# Patient Record
Sex: Female | Born: 1935 | Race: White | Hispanic: No | Smoking: Never smoker
Health system: Southern US, Community
[De-identification: ages and names within clinical notes are randomized; demographics above are authoritative.]

## PROBLEM LIST (undated history)

## (undated) DIAGNOSIS — E079 Disorder of thyroid, unspecified: Secondary | ICD-10-CM

## (undated) DIAGNOSIS — I10 Essential (primary) hypertension: Secondary | ICD-10-CM

## (undated) DIAGNOSIS — E785 Hyperlipidemia, unspecified: Secondary | ICD-10-CM

## (undated) DIAGNOSIS — E119 Type 2 diabetes mellitus without complications: Secondary | ICD-10-CM

## (undated) DIAGNOSIS — I82409 Acute embolism and thrombosis of unspecified deep veins of unspecified lower extremity: Secondary | ICD-10-CM

## (undated) HISTORY — PX: WRIST SURGERY: SHX841

## (undated) HISTORY — DX: Acute embolism and thrombosis of unspecified deep veins of unspecified lower extremity: I82.409

## (undated) HISTORY — PX: ANKLE SURGERY: SHX546

## (undated) HISTORY — DX: Disorder of thyroid, unspecified: E07.9

## (undated) HISTORY — DX: Essential (primary) hypertension: I10

## (undated) HISTORY — PX: HIP FRACTURE SURGERY: SHX118

## (undated) HISTORY — DX: Hyperlipidemia, unspecified: E78.5

## (undated) HISTORY — DX: Type 2 diabetes mellitus without complications: E11.9

---

## 2004-10-27 ENCOUNTER — Emergency Department: Payer: Self-pay | Admitting: Emergency Medicine

## 2004-12-12 ENCOUNTER — Encounter: Payer: Self-pay | Admitting: Orthopaedic Surgery

## 2004-12-18 ENCOUNTER — Encounter: Payer: Self-pay | Admitting: Orthopaedic Surgery

## 2005-01-18 ENCOUNTER — Encounter: Payer: Self-pay | Admitting: Orthopaedic Surgery

## 2005-02-12 ENCOUNTER — Ambulatory Visit: Payer: Self-pay | Admitting: Internal Medicine

## 2006-04-11 ENCOUNTER — Ambulatory Visit: Payer: Self-pay | Admitting: Internal Medicine

## 2007-04-17 ENCOUNTER — Ambulatory Visit: Payer: Self-pay | Admitting: Nurse Practitioner

## 2007-04-24 ENCOUNTER — Ambulatory Visit: Payer: Self-pay | Admitting: Internal Medicine

## 2007-05-07 ENCOUNTER — Ambulatory Visit: Payer: Self-pay | Admitting: Internal Medicine

## 2007-07-02 ENCOUNTER — Inpatient Hospital Stay: Payer: Self-pay | Admitting: Vascular Surgery

## 2008-01-05 ENCOUNTER — Ambulatory Visit: Payer: Self-pay | Admitting: Urology

## 2008-01-16 ENCOUNTER — Ambulatory Visit: Payer: Self-pay | Admitting: Urology

## 2008-01-30 ENCOUNTER — Ambulatory Visit: Payer: Self-pay | Admitting: Urology

## 2008-05-27 ENCOUNTER — Ambulatory Visit: Payer: Self-pay | Admitting: Family Medicine

## 2009-06-16 ENCOUNTER — Ambulatory Visit: Payer: Self-pay | Admitting: Family Medicine

## 2010-02-17 ENCOUNTER — Ambulatory Visit: Payer: Self-pay | Admitting: Internal Medicine

## 2010-02-22 ENCOUNTER — Ambulatory Visit: Payer: Self-pay | Admitting: Unknown Physician Specialty

## 2010-06-12 ENCOUNTER — Ambulatory Visit: Payer: Self-pay | Admitting: Family Medicine

## 2010-06-19 ENCOUNTER — Ambulatory Visit: Payer: Self-pay | Admitting: Family Medicine

## 2011-10-08 ENCOUNTER — Inpatient Hospital Stay: Payer: Self-pay | Admitting: Unknown Physician Specialty

## 2011-10-08 LAB — URINALYSIS, COMPLETE
Bacteria: NONE SEEN
Bilirubin,UR: NEGATIVE
Glucose,UR: NEGATIVE mg/dL (ref 0–75)
Protein: NEGATIVE
RBC,UR: 1 /HPF (ref 0–5)
Squamous Epithelial: 1
WBC UR: 10 /HPF (ref 0–5)

## 2011-10-08 LAB — CBC WITH DIFFERENTIAL/PLATELET
Basophil #: 0 10*3/uL (ref 0.0–0.1)
Basophil %: 0.4 %
Eosinophil #: 0.3 10*3/uL (ref 0.0–0.7)
HCT: 38.2 % (ref 35.0–47.0)
Lymphocyte %: 22.3 %
MCHC: 32.7 g/dL (ref 32.0–36.0)
MCV: 95 fL (ref 80–100)
Monocyte #: 0.6 10*3/uL (ref 0.0–0.7)
Neutrophil %: 67.3 %
RBC: 4.01 10*6/uL (ref 3.80–5.20)
RDW: 13.7 % (ref 11.5–14.5)

## 2011-10-08 LAB — BASIC METABOLIC PANEL
BUN: 19 mg/dL — ABNORMAL HIGH (ref 7–18)
Calcium, Total: 9.4 mg/dL (ref 8.5–10.1)
Co2: 26 mmol/L (ref 21–32)
EGFR (African American): >60
EGFR (Non-African Amer.): >60
Glucose: 144 mg/dL — ABNORMAL HIGH (ref 65–99)
Osmolality: 286 (ref 275–301)
Potassium: 4.1 mmol/L (ref 3.5–5.1)
Sodium: 141 mmol/L (ref 136–145)

## 2011-10-08 LAB — APTT: Activated PTT: 26.9 secs (ref 23.6–35.9)

## 2011-10-09 LAB — BASIC METABOLIC PANEL
Anion Gap: 8 (ref 7–16)
BUN: 19 mg/dL — ABNORMAL HIGH (ref 7–18)
Calcium, Total: 8.2 mg/dL — ABNORMAL LOW (ref 8.5–10.1)
Chloride: 111 mmol/L — ABNORMAL HIGH (ref 98–107)
Co2: 23 mmol/L (ref 21–32)
Creatinine: 0.87 mg/dL (ref 0.60–1.30)
EGFR (African American): >60
EGFR (Non-African Amer.): >60
Glucose: 153 mg/dL — ABNORMAL HIGH (ref 65–99)
Osmolality: 288 (ref 275–301)
Sodium: 142 mmol/L (ref 136–145)

## 2011-10-09 LAB — HEMOGLOBIN: HGB: 9.4 g/dL — ABNORMAL LOW (ref 12.0–16.0)

## 2011-10-11 LAB — HEMOGLOBIN: HGB: 8.4 g/dL — ABNORMAL LOW (ref 12.0–16.0)

## 2012-02-20 ENCOUNTER — Ambulatory Visit: Payer: Self-pay | Admitting: Family Medicine

## 2012-12-08 ENCOUNTER — Ambulatory Visit: Payer: Self-pay | Admitting: Family Medicine

## 2013-03-20 ENCOUNTER — Ambulatory Visit: Payer: Self-pay | Admitting: Family Medicine

## 2013-07-14 ENCOUNTER — Encounter: Payer: Self-pay | Admitting: Podiatry

## 2013-07-14 ENCOUNTER — Ambulatory Visit (INDEPENDENT_AMBULATORY_CARE_PROVIDER_SITE_OTHER): Payer: Medicare Other | Admitting: Podiatry

## 2013-07-14 VITALS — BP 134/79 | HR 76 | Resp 18 | Ht 59.0 in | Wt 118.0 lb

## 2013-07-14 DIAGNOSIS — M79609 Pain in unspecified limb: Secondary | ICD-10-CM

## 2013-07-14 DIAGNOSIS — M2040 Other hammer toe(s) (acquired), unspecified foot: Secondary | ICD-10-CM

## 2013-07-14 DIAGNOSIS — B351 Tinea unguium: Secondary | ICD-10-CM

## 2013-07-14 DIAGNOSIS — Q828 Other specified congenital malformations of skin: Secondary | ICD-10-CM

## 2013-07-14 DIAGNOSIS — E1159 Type 2 diabetes mellitus with other circulatory complications: Secondary | ICD-10-CM

## 2013-07-14 NOTE — Progress Notes (Signed)
  Subjective:    Patient ID: Lorraine Fischer, female    DOB: [birth year], 79 y.o.   MRN: [id number]  HPI Comments: N toenails and dry skin, painful  L both feet 5 D diabetic 1 year  O C A T cant cut self, made a mess of the toenails cant see them      Review of Systems  Endocrine:       Diabetes   All other systems reviewed and are negative.       Objective:   Physical Exam        Assessment & Plan:

## 2013-07-15 NOTE — Progress Notes (Signed)
Subjective:     Patient ID: Lorraine Fischer, female   DOB: Jan 09, 1923, 79 y.o.   MRN: [id number]  HPI patient presents stating I have a painful corn on my big toe right over left foot and nail disease of both feet. States that she is a diabetic has circulation disease cannot feel her feet and is having pain in shoe gear or when the sheets touch her foot right   Review of Systems  All other systems reviewed and are negative.       Objective:   Physical Exam  Nursing note and vitals reviewed. Constitutional: She is oriented to person, place, and time.  Neurological: She is oriented to person, place, and time.  Skin: Skin is dry.   neurovascular status is diminished on both feet with diminishment of sharp dull vibratory and nail disease with thickness and discomfort when pressed 1-5 of both feet. I noted distal right there is a keratotic lesion that is painful when pressed due to the elongation of the big toe     Assessment:     At wrist diabetic with lesion distal right consistent with porokeratosis and nail disease that is painful 1-5 both feet    Plan:     Education given to patient concerning diabetes and foot care. Debridement nailbeds 1-5 both feet with no iatrogenic bleeding and distal right hallux debridement lesion which was very thin and a small amount of bleeding occurred with Band-Aids and Neosporin applied recommended diabetic shoes and we will get permission for her to have these made as they do consider her at risk for future ulceration

## 2013-10-04 ENCOUNTER — Observation Stay: Payer: Self-pay | Admitting: Internal Medicine

## 2013-10-04 DIAGNOSIS — I369 Nonrheumatic tricuspid valve disorder, unspecified: Secondary | ICD-10-CM

## 2013-10-04 LAB — COMPREHENSIVE METABOLIC PANEL
ANION GAP: 3 — AB (ref 7–16)
Albumin: 3.9 g/dL (ref 3.4–5.0)
Alkaline Phosphatase: 73 U/L
BILIRUBIN TOTAL: 0.4 mg/dL (ref 0.2–1.0)
BUN: 18 mg/dL (ref 7–18)
CHLORIDE: 107 mmol/L (ref 98–107)
CO2: 28 mmol/L (ref 21–32)
Calcium, Total: 9.7 mg/dL (ref 8.5–10.1)
Creatinine: 0.76 mg/dL (ref 0.60–1.30)
Glucose: 102 mg/dL — ABNORMAL HIGH (ref 65–99)
Osmolality: 278 (ref 275–301)
Potassium: 3.8 mmol/L (ref 3.5–5.1)
SGOT(AST): 20 U/L (ref 15–37)
SGPT (ALT): 18 U/L (ref 12–78)
Sodium: 138 mmol/L (ref 136–145)
Total Protein: 7.1 g/dL (ref 6.4–8.2)

## 2013-10-04 LAB — URINALYSIS, COMPLETE
BACTERIA: NONE SEEN
BILIRUBIN, UR: NEGATIVE
Blood: NEGATIVE
Glucose,UR: NEGATIVE mg/dL (ref 0–75)
Ketone: NEGATIVE
Leukocyte Esterase: NEGATIVE
Nitrite: NEGATIVE
PROTEIN: NEGATIVE
Ph: 6 (ref 4.5–8.0)
RBC,UR: 1 /HPF (ref 0–5)
SPECIFIC GRAVITY: 1.014 (ref 1.003–1.030)
Squamous Epithelial: <1
Transitional Epi: 1
WBC UR: 6 /HPF (ref 0–5)

## 2013-10-04 LAB — CBC
HCT: 41.2 % (ref 35.0–47.0)
HGB: 13.5 g/dL (ref 12.0–16.0)
MCH: 31.2 pg (ref 26.0–34.0)
MCHC: 32.7 g/dL (ref 32.0–36.0)
MCV: 96 fL (ref 80–100)
PLATELETS: 210 10*3/uL (ref 150–440)
RBC: 4.31 10*6/uL (ref 3.80–5.20)
RDW: 13.9 % (ref 11.5–14.5)
WBC: 8.2 10*3/uL (ref 3.6–11.0)

## 2013-10-04 LAB — TROPONIN I
Troponin-I: <0.02 ng/mL
Troponin-I: <0.02 ng/mL

## 2013-10-04 LAB — CK TOTAL AND CKMB (NOT AT ARMC)
CK, TOTAL: 61 U/L
CK, Total: 52 U/L
CK, Total: 61 U/L
CK-MB: 1.4 ng/mL (ref 0.5–3.6)
CK-MB: 1.4 ng/mL (ref 0.5–3.6)
CK-MB: 1.4 ng/mL (ref 0.5–3.6)

## 2013-10-05 LAB — CBC WITH DIFFERENTIAL/PLATELET
BASOS PCT: 0.5 %
Basophil #: 0 10*3/uL (ref 0.0–0.1)
Eosinophil #: 0.6 10*3/uL (ref 0.0–0.7)
Eosinophil %: 8 %
HCT: 37.5 % (ref 35.0–47.0)
HGB: 12.7 g/dL (ref 12.0–16.0)
Lymphocyte #: 2.4 10*3/uL (ref 1.0–3.6)
Lymphocyte %: 33 %
MCH: 32.1 pg (ref 26.0–34.0)
MCHC: 33.7 g/dL (ref 32.0–36.0)
MCV: 95 fL (ref 80–100)
MONOS PCT: 8.5 %
Monocyte #: 0.6 x10 3/mm (ref 0.2–0.9)
Neutrophil #: 3.6 10*3/uL (ref 1.4–6.5)
Neutrophil %: 50 %
Platelet: 198 10*3/uL (ref 150–440)
RBC: 3.94 10*6/uL (ref 3.80–5.20)
RDW: 13.6 % (ref 11.5–14.5)
WBC: 7.2 10*3/uL (ref 3.6–11.0)

## 2013-10-05 LAB — BASIC METABOLIC PANEL
Anion Gap: 7 (ref 7–16)
BUN: 20 mg/dL — AB (ref 7–18)
CALCIUM: 9.4 mg/dL (ref 8.5–10.1)
CO2: 25 mmol/L (ref 21–32)
CREATININE: 0.75 mg/dL (ref 0.60–1.30)
Chloride: 109 mmol/L — ABNORMAL HIGH (ref 98–107)
EGFR (African American): >60
EGFR (Non-African Amer.): >60
Glucose: 112 mg/dL — ABNORMAL HIGH (ref 65–99)
OSMOLALITY: 285 (ref 275–301)
Potassium: 3.7 mmol/L (ref 3.5–5.1)
SODIUM: 141 mmol/L (ref 136–145)

## 2013-10-05 LAB — LIPID PANEL
CHOLESTEROL: 146 mg/dL (ref 0–200)
HDL Cholesterol: 45 mg/dL (ref 40–60)
LDL CHOLESTEROL, CALC: 75 mg/dL (ref 0–100)
TRIGLYCERIDES: 131 mg/dL (ref 0–200)
VLDL CHOLESTEROL, CALC: 26 mg/dL (ref 5–40)

## 2013-10-05 LAB — TSH: Thyroid Stimulating Horm: 2.2 u[IU]/mL

## 2013-10-05 LAB — MAGNESIUM: Magnesium: 1.7 mg/dL — ABNORMAL LOW

## 2013-10-05 LAB — HEMOGLOBIN A1C: Hemoglobin A1C: 6.1 % (ref 4.2–6.3)

## 2013-10-13 ENCOUNTER — Ambulatory Visit: Payer: Medicare Other | Admitting: Podiatry

## 2013-12-01 ENCOUNTER — Ambulatory Visit: Payer: Self-pay | Admitting: Vascular Surgery

## 2013-12-01 LAB — CBC
HCT: 37.7 % (ref 35.0–47.0)
HGB: 12.6 g/dL (ref 12.0–16.0)
MCH: 31.8 pg (ref 26.0–34.0)
MCHC: 33.3 g/dL (ref 32.0–36.0)
MCV: 96 fL (ref 80–100)
PLATELETS: 202 10*3/uL (ref 150–440)
RBC: 3.95 10*6/uL (ref 3.80–5.20)
RDW: 13.9 % (ref 11.5–14.5)
WBC: 8.2 10*3/uL (ref 3.6–11.0)

## 2013-12-01 LAB — URINALYSIS, COMPLETE
BACTERIA: NONE SEEN
BLOOD: NEGATIVE
Bilirubin,UR: NEGATIVE
Glucose,UR: NEGATIVE mg/dL (ref 0–75)
Hyaline Cast: 2
Ketone: NEGATIVE
NITRITE: NEGATIVE
PH: 5 (ref 4.5–8.0)
Protein: NEGATIVE
RBC,UR: 1 /HPF (ref 0–5)
Specific Gravity: 1.019 (ref 1.003–1.030)
Squamous Epithelial: 3

## 2013-12-01 LAB — BASIC METABOLIC PANEL
Anion Gap: 4 — ABNORMAL LOW (ref 7–16)
BUN: 23 mg/dL — ABNORMAL HIGH (ref 7–18)
CALCIUM: 9.9 mg/dL (ref 8.5–10.1)
Chloride: 107 mmol/L (ref 98–107)
Co2: 28 mmol/L (ref 21–32)
Creatinine: 0.74 mg/dL (ref 0.60–1.30)
EGFR (African American): >60
Glucose: 106 mg/dL — ABNORMAL HIGH (ref 65–99)
OSMOLALITY: 282 (ref 275–301)
Potassium: 4.7 mmol/L (ref 3.5–5.1)
Sodium: 139 mmol/L (ref 136–145)

## 2013-12-03 ENCOUNTER — Inpatient Hospital Stay: Payer: Self-pay | Admitting: Vascular Surgery

## 2013-12-04 LAB — PROTIME-INR
INR: 1
Prothrombin Time: 13 secs (ref 11.5–14.7)

## 2013-12-04 LAB — CBC WITH DIFFERENTIAL/PLATELET
Basophil #: 0 10*3/uL (ref 0.0–0.1)
Basophil %: 0.5 %
Eosinophil #: 0.3 10*3/uL (ref 0.0–0.7)
Eosinophil %: 3.1 %
HCT: 31.8 % — ABNORMAL LOW (ref 35.0–47.0)
HGB: 10.5 g/dL — ABNORMAL LOW (ref 12.0–16.0)
Lymphocyte #: 1.5 10*3/uL (ref 1.0–3.6)
Lymphocyte %: 17.8 %
MCH: 31.8 pg (ref 26.0–34.0)
MCHC: 33 g/dL (ref 32.0–36.0)
MCV: 96 fL (ref 80–100)
MONOS PCT: 7.4 %
Monocyte #: 0.6 x10 3/mm (ref 0.2–0.9)
NEUTROS ABS: 5.8 10*3/uL (ref 1.4–6.5)
NEUTROS PCT: 71.2 %
Platelet: 181 10*3/uL (ref 150–440)
RBC: 3.31 10*6/uL — AB (ref 3.80–5.20)
RDW: 13.9 % (ref 11.5–14.5)
WBC: 8.2 10*3/uL (ref 3.6–11.0)

## 2013-12-04 LAB — BASIC METABOLIC PANEL
Anion Gap: 4 — ABNORMAL LOW (ref 7–16)
BUN: 14 mg/dL (ref 7–18)
CHLORIDE: 113 mmol/L — AB (ref 98–107)
Calcium, Total: 8.1 mg/dL — ABNORMAL LOW (ref 8.5–10.1)
Co2: 24 mmol/L (ref 21–32)
Creatinine: 0.59 mg/dL — ABNORMAL LOW (ref 0.60–1.30)
EGFR (African American): >60
EGFR (Non-African Amer.): >60
GLUCOSE: 109 mg/dL — AB (ref 65–99)
Osmolality: 282 (ref 275–301)
Potassium: 3.8 mmol/L (ref 3.5–5.1)
SODIUM: 141 mmol/L (ref 136–145)

## 2013-12-04 LAB — APTT: ACTIVATED PTT: 27.1 s (ref 23.6–35.9)

## 2013-12-07 LAB — PATHOLOGY REPORT

## 2014-03-08 ENCOUNTER — Ambulatory Visit: Payer: Self-pay | Admitting: Family Medicine

## 2014-09-15 DIAGNOSIS — E119 Type 2 diabetes mellitus without complications: Secondary | ICD-10-CM | POA: Diagnosis not present

## 2014-09-23 DIAGNOSIS — Z0000 Encounter for general adult medical examination without abnormal findings: Secondary | ICD-10-CM | POA: Diagnosis not present

## 2014-09-23 DIAGNOSIS — E039 Hypothyroidism, unspecified: Secondary | ICD-10-CM | POA: Diagnosis not present

## 2014-09-23 DIAGNOSIS — E784 Other hyperlipidemia: Secondary | ICD-10-CM | POA: Diagnosis not present

## 2014-09-23 DIAGNOSIS — I10 Essential (primary) hypertension: Secondary | ICD-10-CM | POA: Diagnosis not present

## 2014-09-23 DIAGNOSIS — Z9181 History of falling: Secondary | ICD-10-CM | POA: Diagnosis not present

## 2014-09-23 DIAGNOSIS — K219 Gastro-esophageal reflux disease without esophagitis: Secondary | ICD-10-CM | POA: Diagnosis not present

## 2014-09-23 DIAGNOSIS — Z1389 Encounter for screening for other disorder: Secondary | ICD-10-CM | POA: Diagnosis not present

## 2014-10-22 DIAGNOSIS — H6123 Impacted cerumen, bilateral: Secondary | ICD-10-CM | POA: Diagnosis not present

## 2014-10-22 DIAGNOSIS — H903 Sensorineural hearing loss, bilateral: Secondary | ICD-10-CM | POA: Diagnosis not present

## 2014-12-11 NOTE — Discharge Summary (Signed)
PATIENT NAME:  Lorraine Fischer, Lorraine Fischer MR#:  [id number] DATE OF BIRTH:  07/31/1923  DATE OF ADMISSION:  10/04/2013 DATE OF DISCHARGE:  10/05/2013  ADMITTING PHYSICIAN: Dr. Bridgette Habermann   DISCHARGING PHYSICIAN: Gladstone Lighter, MD  PRIMARY CARE PHYSICIAN: Dr. Otilio Miu.   CONSULTATIONS IN THE HOSPITAL: Vascular by Dr. Leotis Pain.   DISCHARGE DIAGNOSES: 1.  Syncope likely secondary to vasovagal vs significant right ICA stenosis. 2.  Hemodynamically significant right internal carotid artery stenosis confirmed by CT angiography.  3.  Hypothyroidism.  4.  Hypertension.  5.  Non-insulin-dependent diabetes mellitus.  6.  Osteoarthritis. 7.  Chronic low back pain and degenerative joint disease.  8.  Prior left carotid endarterectomy.  DISCHARGE HOME MEDICATIONS: 1.  Cardizem 180 mg p.o. daily.  2.  Metformin 500 mg p.o. daily.  3.  Zolpidem 10 mg p.o. at bedtime as needed for sleep. 4.  Levothyroxine 50 mcg p.o. daily.  5.  Lovastatin 40 mg p.o. daily.  6.  Lisinopril 40 mg p.o. daily. 7.  Aspirin 81 mg p.o. daily.  8.  Multivitamin 1 tablet p.o. daily.  9.  Aleve 220 mg p.o. q. 8 hours p.r.n.  10.  Plavix 75 mg p.o. daily.   DISCHARGE DIET: Low-sodium and ADA 1800 calorie diet.   DISCHARGE ACTIVITY: As tolerated.   FOLLOWUP INSTRUCTIONS: 1.  PCP followup in 3 weeks.  2.  Vascular followup in 1 to 2 weeks.   HOME OXYGEN: None.  LABORATORY AND IMAGING STUDIES PRIOR TO DISCHARGE: WBC 7.2, hemoglobin 12.7, hematocrit 37.5, platelet count 198.   Sodium 141, potassium 3.7, chloride 109, bicarb 25, BUN 20, creatinine 0.75, glucose 112, and calcium 9.4. Magnesium 1.7. HbA1c 6.1.   LDL cholesterol 75, HDL 45, triglycerides 131, total cholesterol 146. TSH was 2.2. Troponins were negative while in the hospital. Urinalysis negative for any infection.   Echo Doppler showing no evidence of TIA or CVA. Left ventricular ejection fraction is 60% to 65%.   Chest x-rays revealing clear lung fields. No  active cardiopulmonary disease.   CT of the head without contrast showing no acute intracranial abnormality and involutional changes are noted.   Ultrasound carotid Doppler showing right carotid bifurcation and proximal ICA clot resulting in high-grade stenosis, 70 to 99%. No suggestion of hemodynamically significant residual or recurrent stenosis. Post left carotid endarterectomy noted.   CT angiogram of the neck revealing high-grade atherosclerotic narrowing of the proximal ICA, approximately 75% stenosis, and left carotid endarterectomy with no recurrent stenosis and fibromuscular dysplasia on the left more than right internal carotid artery is noted.   BRIEF HOSPITAL COURSE: Lorraine Fischer is a very pleasant 81 year old Caucasian female with past medical history significant for degenerative joint disease, hypertension, chronic back pain, arthritis, and kidney stent history who presented to the hospital secondary to a syncopal episode.  1.  Syncopal episode. Could be vasovagal versus CNS event, and she was worked up for syncope and was admitted to tele. She did not have any arrhythmias on the tele monitor, but her carotid Dopplers did show that she has critically significant stenosis on the right side, which could have been the cause or not for cause of her syncope. Her echo Doppler was completely normal. She was seen by vascular in consultation for her high grade stenosis on the right side, confirmed with CT angio. They recommended outpatient followup, but because at this time the patient is not sure of carotid endarterectomy. If she needs outpatient procedure, then she will need to be cleared  by cardiology and also ENT for any vocal cord paralysis as she already had left-sided carotid endarterectomy. The patient was already taking aspirin at home. She is also discharged on Plavix at this time since she is not sure about her surgery and she is already on a statin as an outpatient.  2.  Hypertension. The  patient is on Cardizem and lisinopril, which were continued.  3.  Hypothyroidism.  Her Synthroid was continued.   Her course has been otherwise uneventful in the hospital.   DISCHARGE CONDITION: Stable.   DISCHARGE DISPOSITION: Home.   TIME SPENT ON DISCHARGE: 40 minutes. ____________________________ Gladstone Lighter, MD rk:sb D: 10/06/2013 14:12:42 ET T: 10/06/2013 14:39:28 ET JOB#: [id number]  cc: Gladstone Lighter, MD, <Dictator> Juline Patch, MD Algernon Huxley, MD Gladstone Lighter MD ELECTRONICALLY SIGNED 10/06/2013 15:16

## 2014-12-11 NOTE — Op Note (Signed)
PATIENT NAME:  Lorraine Fischer, Lorraine Fischer MR#:  [id number] DATE OF BIRTH:  [birth year]  DATE OF PROCEDURE:  12/03/2013  PREOPERATIVE DIAGNOSES:  1. High-grade right carotid artery stenosis with previous history of syncope/transient ischemic attack.  2. Hypertension.  3. Diabetes.  4. Status post left carotid endarterectomy previously.  5. Hyperlipidemia.   POSTOPERATIVE DIAGNOSES:  1. High-grade right carotid artery stenosis with previous history of syncope/transient ischemic attack.  2. Hypertension.  3. Diabetes.  4. Status post left carotid endarterectomy previously.  5. Hyperlipidemia.   PROCEDURE: Right carotid endarterectomy with CorMatrix arterial patch reconstruction.   SURGEON: Algernon Huxley, M.D.   ANESTHESIA: General.   ESTIMATED BLOOD LOSS: Approximately 50 mL.   INDICATION FOR PROCEDURE: This is an 81 year old female whom I saw for high-grade right carotid artery stenosis after she had a syncopal episode and possible TIA about 2 months ago. She went to cardiac clearance and was found to have an acceptable risk from a cardiac standpoint. She elected to go ahead and proceed with surgery for stroke risk reduction, given the high-grade stenosis and the previous symptoms. Risks and benefits were discussed. Informed consent was obtained.   DESCRIPTION OF PROCEDURE: The patient was brought to the operative suite and after an adequate level of general anesthesia obtained, the right neck and chest were sterilely prepped and draped, and a sterile surgical field was created. She was placed in the modified beach chair position. A roll was placed under her shoulders and her head was flexed and turned to the side. An incision was then created along the anterior border of the sternocleidomastoid. We dissected down through platysma with electrocautery. A superficial crossing vein was ligated between silk ties, as was the facial vein. This exposed the carotid bifurcation. The common carotid artery, external  carotid artery, superior thyroid artery and internal carotid artery distal to the lesion were all encircled with vessel loops, and the patient was systemically heparinized. The vessels where then controlled as we pulled up on the vessel loops and anterior wall arteriotomy was created with an 11 blade and extended with Potts scissors. The Pruitt-Inahara shunt was placed first in the internal carotid artery, flushed and de-aired, then in the common carotid artery, flushed and de-aired, and flow was restored to the brain. Approximately 2 minutes elapsed from clamp to restoration of flow. I then reconstructed the artery with a CorMatrix arterial patch. This was cut and beveled, and the 6-0 Prolene was started at the distal endpoint after three 7-0 Prolenes were used to tack down the distal endpoint. The endarterectomy had been performed in the typical fashion with an eversion endarterectomy on the external carotid artery. The common carotid artery was cut flush with tenotomy scissors at its endpoint, and a nice feathered distal endpoint was created with gentle traction. The vessel was locally heparinized. The suture lines were run approximately one-half the length of the arteriotomy medially and laterally and then the patch was cut and beveled to an appropriate length to match the arteriotomy proximally. A second 6-0 Prolene was started at the proximal endpoint. The medial suture line was run and tied together. The lateral suture line was run approximately one-quarter the length of the arteriotomy, and then the shunt had to be removed. The suture line was then completed after flushing from the external, internal and common carotid arteries and locally heparinizing the vessel. We flushed and de-aired through the external carotid artery prior to releasing control. On release, there was an excellent pulse within the internal  carotid artery. Two 6-0 Prolene patch sutures were used for hemostasis. Hemostasis was achieved.  Surgicel and Evicel topical hemostatic agents were placed and hemostasis was complete. The wound was then closed with 3 interrupted 3-0 Vicryl sutures in the sternocleidomastoid space, the platysma was closed with a running 3-0 Vicryl and the skin was closed with a 4-0 Monocryl. Dermabond was placed as a dressing. The patient was awakened from anesthesia and taken to the recovery room in stable condition, having tolerated the procedure well, following commands and moving all 4 extremities.    ____________________________ Algernon Huxley, MD jsd:st D: 12/03/2013 14:23:29 ET T: 12/03/2013 20:06:18 ET JOB#: [id number]  cc: Algernon Huxley, MD, <Dictator> Juline Patch, MD Algernon Huxley MD ELECTRONICALLY SIGNED 12/14/2013 9:35

## 2014-12-11 NOTE — H&P (Signed)
PATIENT NAME:  Lorraine Fischer, DEFRANCO MR#:  [id number] DATE OF BIRTH:  Jan 17, 1923  DATE OF ADMISSION:  10/04/2013  REFERRING PHYSICIAN: Desiree Lucy. Jasmine December, MD  PRIMARY CARE PHYSICIAN: Juline Patch, MD  CHIEF COMPLAINT: Weakness, slurred speech.   HISTORY OF PRESENT ILLNESS: The patient is a pleasant 81 year old female who lives with a son. She is accompanied by another son today here in the ER. The patient apparently went to sleep okay, woke up about 8:00. One of her sons checked on her. She had gone to her room and then as the son did not here back from her later went to check on her and found the patient on the floor. She might have passed out but she was confused. She felt weak and had slurred speech. The symptoms lasted about 30 minutes and currently she is back to her baseline. She has a CAT scan of the head which was negative for a stroke and hospitalist services were contacted for further evaluation and management.   PAST MEDICAL HISTORY: Hypertension, chronic back pain, chronic arthritis, hypothyroidism, diabetes, right kidney stent, history of an arrhythmia but denies having A. fib, history of chronic plantar fasciitis, legally blind, history of left carotid endarterectomy a few years ago, status post hysterectomy, history of surgery for retinal pathology, left hip fracture, right wrist fracture repairs with a pin.   ALLERGIES: None.   SOCIAL HISTORY: No tobacco, alcohol or drug use. Lives with a son.  FAMILY HISTORY: Hypertension and diabetes.   OUTPATIENT MEDICATIONS: Metformin 500 mg daily, Aleve 220 mg every 8 hours as needed, aspirin 81 mg daily, diltiazem CD 180 mg once a day, levothyroxine 50 mcg daily, lisinopril 40 mg daily, lovastatin 40 mg at that time, multivitamin 1 tab daily, zolpidem 10 mg at bedtime as needed.   REVIEW OF SYSTEMS: CONSTITUTIONAL: No fever, fatigue, weakness.  EYES: Legally blind, has a history of retinal pathology on the left eye.  ENT: No hearing loss, has  occasional tinnitus on the left. No nasal discharge or snoring.  RESPIRATORY: No cough, wheezing, shortness of breath or COPD.   CARDIOVASCULAR: No chest pain or palpitations. Has an arrhythmia.  GASTROINTESTINAL: No nausea, vomiting, diarrhea, bloody stools or dark stools.  GENITOURINARY: Denies dysuria or hematuria now but has some chronic burning, sees Dr. Jacqlyn Larsen.  ENDOCRINE: Denies polyuria or nocturia. Has hypothyroidism.  HEMATOLOGIC AND LYMPHATIC: No anemia or easy bruising.  SKIN: No rashes.  MUSCULOSKELETAL: Has chronic arthritis and back pain and right shoulder pain.  NEUROLOGIC: No focal weakness or numbness. No history of stroke.  PSYCHIATRIC: No anxiety or depression.   PHYSICAL EXAMINATION: VITAL SIGNS: Temperature on arrival not noted, pulse was 64, respiratory rate was 18, blood pressure 178/50, O2 sat 100% on room air.  GENERAL: The patient is a well-developed, pleasant female, lying in bed in no obvious distress.  HEENT: Normocephalic, atraumatic. Right pupil is reactive, left pupil is irregular. Extraocular muscles intact. Moist mucous membranes.   NECK: Supple. No thyroid tenderness. No cervical lymphadenopathy.  CARDIOVASCULAR: S1, S2 regular. No significant rubs. The patient has murmur at the base.  LUNGS: Clear to auscultation. No wheezing, rhonchi or rales.  ABDOMEN: Soft, nontender, nondistended.  EXTREMITIES: No pitting edema.  NEUROLOGIC: Cranial nerves appear to be intact II to X. Strength is 5 out of 5 in all extremities. Sensation intact to light touch.  PSYCHIATRIC: Awake, alert and oriented x 3.   LABORATORY, DIAGNOSTIC AND RADIOLOGICAL DATA:  UA is not suggestive of infection. CBC  within normal limits. Troponin negative. LFTs within normal limits. Glucose 102, BUN 18, creatinine 0.76, sodium 138, potassium 3.8. EKG: Sinus bradycardia with first degree AV block, some nonspecific T wave changes, no acute ST elevations CT of the head without contrast showing no  acute intracranial abnormality, no hemorrhage or hydrocephalus. There is involutional changes appreciated. X-ray chest, 1 view, showing no active disease.   ASSESSMENT AND PLAN: We have a pleasant 81 year old female with history of carotid endarterectomy on the left, history of arrhythmia, unclear what it is; hypertension, diabetes, with a possible syncopal episode and possible TIA. At this point, the patient is back to normal and baseline, slurred speech has resolved. Would admit the patient with the TIA pathway. We, unfortunately, cannot do an MRI as the patient has, per her, a stent in her neck, pins in her hip and finger.  Furthermore, she is back to normal. I do not suspect a stroke at this point. Would obtain a carotid ultrasound, place her in a monitored bed and check an echocardiogram for further workup in addition to getting frequent neuro checks. Would continue the aspirin and the statin, check a lipid profile and cycle the troponins and CK-MB. I would check a TSH, lipid profile. Would control the blood pressure better and monitor her neurologically.   CODE STATUS: The patient is full code.   TOTAL TIME SPENT: Is 40 minutes.   ____________________________ Vivien Presto, MD sa:cs D: 10/04/2013 14:06:00 ET T: 10/04/2013 14:22:04 ET JOB#: [id number]  cc: Vivien Presto, MD, <Dictator> Juline Patch, MD Vivien Presto MD ELECTRONICALLY SIGNED 10/23/2013 13:02

## 2014-12-11 NOTE — Consult Note (Signed)
CHIEF COMPLAINT and HISTORY:  Subjective/Chief Complaint Syncope Consulted for carotid stenosis   History of Present Illness Patient is an 81 year old female who was found to by her son on the floor. She may have passed out. She was confused for about 30 minutes then returned to normal.  CT scan of the head was negative for a stroke. She has not had any prior episodes like this.  Denies any other symptoms of stroke or TIA including temporary monocular blindness, slurring speech, facial droop, or weakness/paralysis of an extremity.  We were consulted for carotid artery stenosis.   PAST MEDICAL/SURGICAL HISTORY:  Past Medical History:   Carotid Artery Stenosis:    DM:    Heart Murmer:    Hypertension:    Back Pain, Chronic:    Arthritis:    Hypothyroidism:    Hypothyroidism:    Hypertension:    Kidney Stent:    Eye Surgery:    Hysterectomy:   ALLERGIES:  Allergies:  No Known Allergies:   HOME MEDICATIONS:  Home Medications: Medication Instructions Status  Plavix 75 mg oral tablet 1 tab(s) orally once a day Active  Diltiazem Hydrochloride CD 180 mg/24 hours oral capsule, extended release 1 cap(s) orally once a day (in the morning) Active  metformin 500 mg oral tablet 1 tab(s) orally once a day (in the morning) Active  zolpidem 10 mg oral tablet 1 tab(s) orally once a day (at bedtime) as needed for sleep. Active  levothyroxine 50 mcg (0.05 mg) oral tablet 1 tab(s) orally once a day (in the morning).  **brand name synthroid** Active  lovastatin 40 mg oral tablet 1 tab(s) orally once a day (at bedtime) Active  lisinopril 40 mg oral tablet 1 tab(s) orally once a day Active  aspirin 81 mg oral tablet 1 tab(s) orally once a day Active  multivitamin 1 tab(s) orally once a day Active  Aleve sodium 220 mg oral tablet 1 tab(s) orally every 8 hours, As Needed Active   Family and Social History:  Family History Hypertension  Diabetes Mellitus   Social History negative  tobacco, negative ETOH, negative Illicit drugs   Review of Systems:  Fever/Chills No   Cough No   Sputum No   Abdominal Pain No   Diarrhea No   Constipation No   Nausea/Vomiting No   SOB/DOE No   Chest Pain No   Tolerating Diet Yes   Physical Exam:  GEN no acute distress   HEENT PERRL, hearing intact to voice, moist oral mucosa   NECK supple  No masses   RESP normal resp effort  clear BS   CARD regular rate  positive carotid bruits   ABD denies tenderness  soft  normal BS   LYMPH negative neck   EXTR negative cyanosis/clubbing   SKIN normal to palpation, No rashes   NEURO cranial nerves intact   PSYCH alert, A+O to time, place, person   LABS:  Laboratory Results: Thyroid:    16-Feb-15 04:50, Thyroid Stimulating Hormone  Thyroid Stimulating Hormone 2.20  0.45-4.50  (International [facility])   -----------------------  Pregnant patients have   different reference   ranges for TSH:   - - - - - - - - - -   Pregnant, first trimetser:   0.36 - 2.50 uIU/mL  LabObservation:    15-Feb-15 15:23, Echo Doppler  OBSERVATION   Reason for Test  Hepatic:    15-Feb-15 10:55, Comprehensive Metabolic Panel  Bilirubin, Total 0.4  Alkaline Phosphatase 73  45-117  NOTE: New Reference Range  07/10/13  SGPT (ALT) 18  SGOT (AST) 20  Total Protein, Serum 7.1  Albumin, Serum 3.9  Cardiology:    15-Feb-15 11:30, ED ECG  Ventricular Rate 59  Atrial Rate 59  P-R Interval 218  QRS Duration 82  QT 448  QTc 443  P Axis 57  R Axis 35  T Axis 124  ECG interpretation   Sinus bradycardia with 1st degree A-V block  ST & T wave abnormality, consider lateral ischemia  Abnormal ECG  When compared with ECG of 08-Oct-2011 12:13,  T wave inversion less evident in Lateral leads  ----------unconfirmed----------  Confirmed byOVERREAD, NOT ([id number]), editor PEARSON, BARBARA ([id number]) on 10/05/2013 8:09:57 AM  ED ECG     15-Feb-15 15:23, Echo Doppler  Echo Doppler   REASON FOR EXAM:       COMMENTS:       PROCEDURE: [place] - ECHO DOPPLER COMPLETE(TRANSTHOR)  - Oct 04 2013  3:23PM     RESULT: Echocardiogram Report    Patient Name:   RICKIE GUTIERRES Date of Exam: 10/04/2013  Medical Rec #:  [id number]       Custom1:  Date of Birth:  1922-12-31     Height:       59.0 in  Patient Age:    70 years     Weight:       118.0 lb  Patient Gender: F            BSA:          1.47 m??    Indications: TIA  Sonographer:    [place]  Referring Phys: Graciella Freer, H    Summary:   1. No source of TIA or CVA noted.   2. Left ventricular ejection fraction, by visual estimation, is 60 to   65%.   3. Normal global left ventricular systolic function.   4. Mild left ventricular hypertrophy.   5. Normal right ventricular size and systolic function.   6. Mild to moderate tricuspid regurgitation.   7. Mildly elevated pulmonary artery systolic pressure.  2D AND M-MODE MEASUREMENTS (normal ranges within parentheses):  Left Ventricle:          Normal  IVSd (2D):      1.10 cm (0.7-1.1)  LVPWd (2D):     1.13 cm (0.7-1.1) Aorta/LA:                  Normal  LVIDd (2D):     3.67 cm (3.4-5.7) Aortic Root (2D): 2.30 cm (2.4-3.7)  LVIDs (2D):     2.06 cm           Left Atrium (2D): 3.60 cm (1.9-4.0)  LV FS (2D):     43.9 %   (>25%)  LV EF (2D):     75.9 %   (>50%)                                    Right Ventricle:                                    RVd (2D):  LV DIASTOLIC FUNCTION:  MV Peak E: 1.16 m/s Decel Time: 359 msec  MV Peak A: 1.05 m/s  E/A Ratio: 1.10  SPECTRAL DOPPLER ANALYSIS (where applicable):  Mitral Valve:  MV P1/2 Time:  104.11 msec  MV Area, PHT: 2.11 cm??  Aortic Valve: AoV Max Vel: 1.87 m/s AoV Peak PG: 14.0 mmHg AoV Mean PG:  LVOT Vmax: 1.24 m/s LVOT VTI:  LVOT Diameter: 1.70 cm  AoV Area, Vmax: 1.51 cm?? AoV Area, VTI:  AoV Area, Vmn:  Aortic Insufficiency:  AI Half-time:  406 msec  AI Decel Rate: 3.10 m/s??  Tricuspid Valve and PA/RV Systolic Pressure: TR Max  Velocity: 3.08 m/s RA   Pressure: 10 mmHg RVSP/PASP: 47.9 mmHg  Pulmonic Valve:  PV Max Velocity: 1.25 m/s PV Max PG: 6.2 mmHg PV Mean PG:    PHYSICIAN INTERPRETATION:  Left Ventricle: The left ventricular internal cavity size was normal. LV   posterior wall thickness was normal. Mild left ventricular hypertrophy.   Global LV systolic function wasnormal. Left ventricular ejection   fraction, by visual estimation, is 60 to 65%. Spectral Doppler shows     normal pattern of LV diastolic filling.  Right Ventricle: Normal right ventricular size, wall thickness, and   systolic function. The right ventricular size is normal. Global RV   systolic function is normal.  Left Atrium: The left atrium is normal in size.  Right Atrium: The right atrium is normal in size.  Pericardium: There is no evidence of pericardial effusion.  Mitral Valve: The mitral valve is normal in structure. No evidence of   mitral valve regurgitation is seen.  Tricuspid Valve: The tricuspid valve is normal. Mild to moderate   tricuspid regurgitation is visualized. The tricuspid regurgitant velocity   is 3.08 m/s, and withan assumed right atrial pressure of 10 mmHg, the   estimated right ventricular systolic pressure is mildly elevated at 47.9   mmHg.  Aortic Valve: The aortic valve is normal. Mild aortic valve sclerosis is     present, with no evidence of aortic valve stenosis. Trivial aortic valve   regurgitation is seen.  Pulmonic Valve: The pulmonic valve is not well seen. The pulmonic valve   is normal. Trace pulmonic valve regurgitation.  Aorta: The aortic root and ascending aorta are structurally normal, with   no evidence of dilitation.    [id number] Ida Rogue MD  Electronically signed by [id number] Ida Rogue MD  Signature Date/Time: 10/04/2013/4:42:12 PM    *** Final ***    IMPRESSION: .    Verified By: Minna Merritts, M.D., MD  Routine Chem:    15-Feb-15 10:55, Comprehensive Metabolic Panel   Glucose, Serum 102  BUN 18  Creatinine (comp) 0.76  Sodium, Serum 138  Potassium, Serum 3.8  Chloride, Serum 107  CO2, Serum 28  Calcium (Total), Serum 9.7  Osmolality (calc) 278  eGFR (African American) >60  eGFR (Non-African American) >60  eGFR values <58m/min/1.73 m2 may be an indication of chronic  kidney disease (CKD).  Calculated eGFR is useful in patients with stable renal function.  The eGFR calculation will not be reliable in acutely ill patients  when serum creatinine is changing rapidly. It is not useful in   patients on dialysis. The eGFR calculation may not be applicable  to patients at the low and high extremes of body sizes, pregnant  women, and vegetarians.  Anion Gap 3    16-Feb-15 078:24 Basic Metabolic Panel (w/Total Calcium)  Glucose, Serum 112  BUN 20  Creatinine (comp) 0.75  Sodium, Serum 141  Potassium, Serum 3.7  Chloride, Serum 109  CO2, Serum 25  Calcium (Total), Serum 9.4  Anion Gap 7  Osmolality (calc) 285  eGFR (African American) >  60  eGFR (Non-African American) >60  eGFR values <78m/min/1.73 m2 may be an indication of chronic  kidney disease (CKD).  Calculated eGFR is useful in patients with stable renal function.  The eGFR calculation will not be reliable in acutely ill patients  when serum creatinine is changing rapidly. It is not useful in   patients on dialysis. The eGFR calculation may not be applicable  to patients at the low and high extremes of body sizes, pregnant  women, and vegetarians.    16-Feb-15 04:50, Hemoglobin A1c ([facility])  Hemoglobin A1c ([facility]) 6.1  The American Diabetes Association recommends that a primary goal of  therapy should be <7% and that physicians should reevaluate the  treatment regimen in patients with HbA1c values consistently >8%.    16-Feb-15 04:50, Lipid Profile ([facility]  Cholesterol, Serum 146  Triglycerides, Serum 131  HDL (INHOUSE) 45  VLDL Cholesterol Calculated 26  LDL Cholesterol Calculated 75   Result(s) reported on 05 Oct 2013 at 05:38AM.    16-Feb-15 04:50, Magnesium, Serum  Magnesium, Serum 1.7  1.8-2.4  THERAPEUTIC RANGE: 4-7 mg/dL  TOXIC: > 10 mg/dL   -----------------------  Cardiac:    15-Feb-15 10:55, Cardiac Panel  CK, Total 52  26-192  NOTE: NEW REFERENCE RANGE   09/21/2013  CPK-MB, Serum 1.4  Result(s) reported on 04 Oct 2013 at 04:23PM.    15-Feb-15 10:55, Troponin I  Troponin I < 0.02  0.00-0.05  0.05 ng/mL or less: NEGATIVE   Repeat testing in 3-6 hrs   if clinically indicated.  >0.05 ng/mL: POTENTIAL   MYOCARDIAL INJURY. Repeat   testing in 3-6 hrs if   clinically indicated.  NOTE: An increase or decrease   of 30% or more on serial   testing suggests a   clinically important change    15-Feb-15 16:32, Cardiac Panel  CK, Total 61  26-192  NOTE: NEW REFERENCE RANGE   09/21/2013  CPK-MB, Serum 1.4  Result(s) reported on 04 Oct 2013 at 05:05PM.    15-Feb-15 16:32, Troponin I  Troponin I < 0.02  0.00-0.05  0.05 ng/mL or less: NEGATIVE   Repeat testing in 3-6 hrs   if clinically indicated.  >0.05 ng/mL: POTENTIAL   MYOCARDIAL INJURY. Repeat   testing in 3-6 hrs if   clinically indicated.  NOTE: An increase or decrease   of 30% or more on serial   testing suggests a   clinically important change    15-Feb-15 18:42, Cardiac Panel  CK, Total 61  26-192  NOTE: NEW REFERENCE RANGE   09/21/2013  CPK-MB, Serum 1.4  Result(s) reported on 04 Oct 2013 at 09:00PM.    15-Feb-15 18:42, Troponin I  Troponin I < 0.02  0.00-0.05  0.05 ng/mL or less: NEGATIVE   Repeat testing in 3-6 hrs   if clinically indicated.  >0.05 ng/mL: POTENTIAL   MYOCARDIAL INJURY. Repeat   testing in 3-6 hrs if   clinically indicated.  NOTE: An increase or decrease   of 30% or more on serial   testing suggests a   clinically important change  Routine UA:    15-Feb-15 11:35, Urinalysis  Color (UA) Yellow  Clarity (UA) Clear  Glucose (UA) Negative  Bilirubin (UA)  Negative  Ketones (UA) Negative  Specific Gravity (UA) 1.014  Blood (UA) Negative  pH (UA) 6.0  Protein (UA) Negative  Nitrite (UA) Negative  Leukocyte Esterase (UA) Negative  Result(s) reported on 04 Oct 2013 at 11:51AM.  RBC (UA) 1 /HPF  WBC (  UA) 6 /HPF  Bacteria (UA)   NONE SEEN  Epithelial Cells (UA) <1 /HPF  Transitional Epithelial (UA) 1 /HPF  Mucous (UA) PRESENT  Result(s) reported on 04 Oct 2013 at 11:51AM.  Routine Hem:    15-Feb-15 10:55, Hemogram, Platelet Count  WBC (CBC) 8.2  RBC (CBC) 4.31  Hemoglobin (CBC) 13.5  Hematocrit (CBC) 41.2  Platelet Count (CBC) 210  Result(s) reported on 04 Oct 2013 at 11:15AM.  MCV 96  MCH 31.2  MCHC 32.7  RDW 13.9    16-Feb-15 04:50, CBC Profile  WBC (CBC) 7.2  RBC (CBC) 3.94  Hemoglobin (CBC) 12.7  Hematocrit (CBC) 37.5  Platelet Count (CBC) 198  MCV 95  MCH 32.1  MCHC 33.7  RDW 13.6  Neutrophil % 50.0  Lymphocyte % 33.0  Monocyte % 8.5  Eosinophil % 8.0  Basophil % 0.5  Neutrophil # 3.6  Lymphocyte # 2.4  Monocyte # 0.6  Eosinophil # 0.6  Basophil # 0.0  Result(s) reported on 05 Oct 2013 at 05:57AM.   RADIOLOGY:  Radiology Results: XRay:    21-Apr-14 16:37, Cervical Spine Complete ([place])  Cervical Spine Complete ([place])  REASON FOR EXAM:    neck pain  COMMENTS:       PROCEDURE: MDR - MDR CERVICAL SPINE COMPLETE  - Dec 08 2012  4:37PM     RESULT: The study is limited due to patient positioning and technical   factors. As best as can be determined the cervical vertebral bodies are   preserved in height. No high-grade disc space narrowing is present. There   is accentuation of the cervical lordosis and thoracic kyphosis. There is   bony encroachment upon the neural foramina at multiple levels due to   osteophytes. The odontoid appears intact. The prevertebral soft tissue   spaces are grossly normal.    IMPRESSION:  There are degenerative facet joint changes at multiple   levels. I do not see definite  evidence of a compression fracture nor of     high-grade disc space narrowing. If there are radicular symptoms,   followup MRI may be useful if the patient can tolerate the procedure.     Dictation Site: 1        Verified By: DAVID A. Martinique, M.D., MD    01-Aug-14 09:35, Lumbar Spine With Obliques ([place])  Lumbar Spine With Obliques ([place])  REASON FOR EXAM:    lower back pain  COMMENTS:       PROCEDURE: MDR - MDR LUMBAR SPINE WITH OBLIQUES  - Mar 20 2013  9:35AM     RESULT: Images demonstrate a fairly significant rotoscoliotic curvature   concave to the right centered in the upper lumbar region. Advanced facet   hypertrophy is present especially on the left at L4-L5 and L5-S1.   Atherosclerotic calcification is present. Degenerative sacroiliac changes   are present. There is density in the area the right kidney consistent   with right nephrolithiasis. There is no compression fracture. There is   grade 1 anterolisthesis of L4 on L5. There is disc narrowing at L5-S1,   L4-L5, L3-L4 and L2-L3.    IMPRESSION:  Moderately severe degenerative changes in the lumbar spine     with a prominent thoracolumbar scoliosis. The findings suggestive of   right nephrolithiasis. Correlate clinically.    Dictation Site: 2        Verified By: Sundra Aland, M.D., MD    15-Feb-15 11:23, Chest Portable Single View  Chest Portable  Single View  REASON FOR EXAM:    CVA  COMMENTS:       PROCEDURE: DXR - DXR PORTABLE CHEST SINGLE VIEW  - Oct 04 2013 11:23AM     CLINICAL DATA:  Sudden onset weakness,slurred speech,hx htn and  heart murmur    EXAM:  PORTABLE CHEST - 1 VIEW    COMPARISON:  DG CERVICAL SPINE COMPLETE 4+V dated 12/08/2012; DG  CHEST 1V dated 10/08/2011    FINDINGS:  The heart size and mediastinal contours are within normal limits.  Both lungs are clear. The visualized skeletal structures are  unremarkable. Atherosclerotic calcifications identified within the  aorta.      IMPRESSION:  No active disease.      Electronically Signed    By: Margaree Mackintosh M.D.    On: 10/04/2013 11:40         Verified By: Mikki Santee, M.D., MD  [place]:    16-Feb-15 11:35, US Carotid Doppler Bilateral  US Carotid Doppler Bilateral  REASON FOR EXAM:    tia. eval for stenosis  COMMENTS:       PROCEDURE: [place]  - US CAROTID DOPPLER BILATERAL  - Oct 05 2013 11:35AM     CLINICAL DATA:  TIA, hypertension, coronary artery disease,  diabetes, bruit. Previous carotid endarterectomy.    EXAM:  BILATERAL CAROTID DUPLEX ULTRASOUND    TECHNIQUE:  Pearline Cables scale imaging, color Doppler and duplex ultrasound was  performed of bilateral carotid and vertebral arteries in the neck.  COMPARISON:  MRI   05/07/2007 and earlier studies    REVIEW OF SYSTEMS:  Quantification of carotid stenosis is based on velocity parameters  that correlate the residual internal carotid diameter with  NASCET-based stenosis levels, using the diameter of the distal  internal carotid lumen as the denominator for stenosis measurement.    The following velocity measurements were obtained:    PEAK SYSTOLIC/END DIASTOLIC    RIGHT    ICA:                     514/27cm/sec  CCA:                     32/6ZT/IWP    SYSTOLIC ICA/CCA RATIO:  6.8    DIASTOLIC ICA/CCA RATIO: 7.5    ECA:                     199cm/sec    LEFT    ICA:                     107/32cm/sec    CCA:  809/9 Cm/sec    SYSTOLIC ICA/CCA RATIO:  0.9  DIASTOLIC ICA/CCA RATIO: 2.3    ECA:                     100cm/sec    FINDINGS:  RIGHT CAROTID ARTERY: Calcified plaque in the distal common carotid  artery and bulb resulting at least moderate stenosis involving  proximal internal and external carotid arteries. Elevated peak  systolic velocities are recorded in the ICA.    RIGHT VERTEBRAL ARTERY:  Normal flow direction and waveform.    LEFT CAROTID ARTERY: Previous endarterectomy. Mild plaque in the  distal common carotid artery  ,bulb, and ICA without high-grade  stenosis.  LEFT VERTEBRAL ARTERY: Normal flow direction and waveform.     IMPRESSION:  1. Right carotid bifurcation and proximal ICA plaque resulting in  high-grade stenosis,  70-99%. Recommend vascular surgical or  neurointerventional radiology consultation.  2. No suggestion of hemodynamically significant residual or  recurrent stenosis post left carotid endarterectomy.      Electronically Signed    By: Arne Cleveland M.D.    On: 10/05/2013 12:05       Verified By: Kandis Cocking, M.D.,  [place]:    21-Apr-14 16:37, Cervical Spine Complete ([place])  PACS Image    01-Aug-14 09:35, Lumbar Spine With Obliques ([place])  PACS Image    15-Feb-15 11:13, CT Head Without Contrast  PACS Image    15-Feb-15 11:23, Chest Portable Single View  PACS Image    16-Feb-15 11:35, US Carotid Doppler Bilateral  PACS Image    16-Feb-15 14:24, CT Angiography Neck (Carotids)  PACS Image  CT:    15-Feb-15 11:13, CT Head Without Contrast  CT Head Without Contrast  REASON FOR EXAM:    CVA  COMMENTS:   May transport without cardiac monitor    PROCEDURE: CT  - CT HEAD WITHOUT CONTRAST  - Oct 04 2013 11:13AM     CLINICAL DATA:  Family states pt was normal self this morning, then  around 9am had sudden onset slurred speech , weakness unable to  ambulate, and confusion..states this lasted about 59mn..states pt  is back to normal self at present..Marland Kitchen   EXAM:  CT HEAD WITHOUT CONTRAST    TECHNIQUE:  Contiguous axial images were obtained from the base of the skull  through the vertex without intravenous contrast.    COMPARISON:  None.    FINDINGS:  No acute intracranial abnormality. Specifically, no hemorrhage,  hydrocephalus, mass lesion, acute infarction, or significant  intracranial injury. No acute calvarialabnormality. Global atrophy.  Diffuse areas of low attenuation within subcortical, deep, and  periventricular white matter regions. Hydrocephalus  ex vacuo.  Visualized paranasal sinuses and mastoid air cells are patent.     IMPRESSION:  No acute intracranial abnormality. Involutional changes appreciated.  Electronically Signed    By: HMargaree MackintoshM.D.    On: 10/04/2013 11:23         Verified By: HMikki Santee M.D., MD    16-Feb-15 14:24, CT Angiography Neck (Carotids)  CT Angiography Neck (Carotids)  REASON FOR EXAM:    right carotid significant stenosis  COMMENTS:       PROCEDURE: CT  - CT ANGIOGRAPHY NECK W/CONTRAST  - Oct 05 2013  2:24PM     CLINICAL DATA:  Right-sided carotid stenosis.    EXAM:  CT ANGIOGRAPHY NECK    TECHNIQUE:  Multidetector CT imaging of the neck was performed using the  standard protocol during bolus administration of intravenous  contrast. Multiplanar CT image reconstructions and MIPs were  obtained to evaluate the vascular anatomy. Carotid stenosis  measurements (when applicable) are obtained utilizing NASCET  criteria, using the distal internal carotid diameter as the  denominator.    CONTRAST:  80 cc Isovue 370 intravenous    COMPARISON:  MRA of the neck 05/07/2007    FINDINGS:  The aortic arch is atherosclerotic but non aneurysmal. There is no  evidence of aortic dissection. There is bulky plaque at the origins  of the great vessels, without significant stenosis. Aortic arch has  a standard branching pattern.    Carotid circulation:  Extensive, predominant calcified plaque deposition at the right  carotid bifurcation. Related stenosis involves the distal common  carotid into the internal carotid artery, where there is  approximately 75% stenosis. Mild undulating luminal contour  of the  mid right ICA.    Suspect previous left carotid endarterectomy. No recurrent stenosis.  There is beaded appearance of the distal left ICA consistent with  fibromuscular dysplasia. No evidence of acute dissection. No  pseudoaneurysm.    Vertebral system: Right dominant vertebral artery.  The proximal  right vertebral artery is kinked, without evidence of flow limiting  stenosis. There is scattered calcific atherosclerotic plaque along  the bilateral cervical vertebral arteries, without evidence of  stenosis. No evidence of dissection or other acute vascular  abnormality.    No acute or significant/unexpected findings in the soft tissue of  the neck. There is biapical subpleural opacities most consistent  with scarring, relatively symmetric.    Review of the MIP images confirms the above findings.     IMPRESSION:  1. High-grade atherosclerotic narrowing of the proximal right ICA,  approximately 75% stenosis.  2. Left carotid endarterectomy.  No recurrent stenosis.  3. Fibromuscular dysplasia of the left more than right internal  carotid arteries.  Electronically Signed    By: Jorje Guild M.D.    On: 10/05/2013 14:41         Verified By: Gilford Silvius, M.D.,   ASSESSMENT AND PLAN:  Assessment/Admission Diagnosis Possible syncopal episode Hypertension Hyperlipidemia Carotid Artery Stenosis   Plan Carotid arterystenosis s/p carotid duplex and CTA. Dr. Lucky Cowboy reviewed CTA images. Radiologist read right ICA stenosis as approximately 75% stenosis, on review by Dr. Lucky Cowboy appears to be about 90%+ right ICA stenosis. Would recommend carotid endarterectomy. She would need to have cardiac clearance and see ENT to check for vocal cord paralysis prior (since she has also had left CEA). Unable to determine if her carotid stenosis was the cause of her syncopal episode.  At this time she is not sure she wants to have surgery. Informed her that if she decides not to have surgery, would add plavix to take in addition to 6m aspirin and statin.  She will consider her options. She is being discharged today. She will call our office for an appointment in the next 1-2 weeks or sooner if she decides to move forward with surgery.   Electronic Signatures: HSu Grand (PA-C)  (Signed 16-Feb-15 19:24)  Authored: Chief Complaint and History, PAST MEDICAL/SURGICAL HISTORY, ALLERGIES, HOME MEDICATIONS, Family and Social History, Review of Systems, Physical Exam, LABS, RADIOLOGY, Assessment and Plan   Last Updated: 16-Feb-15 19:24 by HSu Grand(PA-C)

## 2014-12-12 NOTE — H&P (Signed)
Subjective/Chief Complaint Left hip pain    History of Present Illness Lost balance and fell tbhis AM at home. No LOC. Could not ambulate. To ER where films showed an intertrochanteric fracture.   Past Med/Surgical Hx:  Heart Murmer:   Hypertension:   Back Pain, Chronic:   Arthritis:   Hypothyroidism:   Hypothyroidism:   Hypertension:   Kidney Stent:   Eye Surgery:   Hysterectomy:   ALLERGIES:  No Known Allergies:   HOME MEDICATIONS: Medication Instructions Status  aspirin 81 mg oral tablet 1 tab(s) orally once a day (in the morning) Active  colon cleanser capsules 3 cap(s) orally as needed. Active  Diltiazem Hydrochloride CD 180 mg/24 hours oral capsule, extended release 1 cap(s) orally once a day (in the morning) Active  Fish Oil 1000 mg oral capsule 1 cap(s) orally once a day as needed. Active  lisinopril 40 mg oral tablet 1 tab(s) orally once a day (in the morning) Active  lovastatin 40 mg oral tablet 1 tab(s) orally once a day (in the evening) Active  metformin 500 mg oral tablet 1 tab(s) orally once a day (in the morning) Active  multivitamin 1 tab(s) orally once a day Active  zolpidem 10 mg oral tablet 1 tab(s) orally once a day (at bedtime) as needed. Active  levothyroxine 50 mcg (0.05 mg) oral tablet 1 tab(s) orally once a day (in the morning).  **brand name synthroid** Active  nitrofurantoin macrocrystals 100 mg oral capsule 1 cap(s) orally once a day Active   Family and Social History:   Family History Non-Contributory    Social History negative tobacco, negative ETOH    Place of Living Home  with son   Review of Systems:   Fever/Chills No    Cough No    Sputum No    Abdominal Pain No    Diarrhea No    Constipation No    Nausea/Vomiting No    SOB/DOE No    Chest Pain No    Dysuria No    Tolerating Diet Yes   Physical Exam:   GEN WD    HEENT PERRL    NECK supple    RESP normal resp effort  clear BS    CARD regular rate  murmur  present    ABD denies tenderness    GU foley catheter in place    LYMPH negative neck    EXTR Normal except left hip which is painful. Leg externally rotated, knee is nontender    SKIN normal to palpation    NEURO motor/sensory function intact    PSYCH A+O to time, place, person   Routine Chem:  18-Feb-13 09:12    Glucose, Serum 144   BUN 19   Creatinine (comp) 0.81   Sodium, Serum 141   Potassium, Serum 4.1   Chloride, Serum 106   CO2, Serum 26   Calcium (Total), Serum 9.4   Anion Gap 9   Osmolality (calc) 286   eGFR (African American) >60   eGFR (Non-African American) >60  Routine Hem:  18-Feb-13 09:12    WBC (CBC) 9.3   RBC (CBC) 4.01   Hemoglobin (CBC) 12.5   Hematocrit (CBC) 38.2   Platelet Count (CBC) 196   MCV 95   MCH 31.2   MCHC 32.7   RDW 13.7   Neutrophil % 67.3   Lymphocyte % 22.3   Monocyte % 6.8   Eosinophil % 3.2   Basophil % 0.4   Neutrophil # 6.2  Lymphocyte # 2.1   Monocyte # 0.6   Eosinophil # 0.3   Basophil # 0.0  Routine BB:  18-Feb-13 10:24    Antibody Screen NEGATIVE  Routine UA:  18-Feb-13 11:57    Color (UA) Yellow   Clarity (UA) Cloudy   Glucose (UA) Negative   Bilirubin (UA) Negative   Ketones (UA) Trace   Specific Gravity (UA) 1.013   Blood (UA) Negative   pH (UA) 7.0   Protein (UA) Negative   Nitrite (UA) Positive   Leukocyte Esterase (UA) Trace   RBC (UA) 1 /HPF   WBC (UA) 10 /HPF   Epithelial Cells (UA) 1 /HPF   Mucous (UA) PRESENT   Radiology Results: XRay:    18-Feb-13 09:52, Chest 1 View AP or PA   Chest 1 View AP or PA   REASON FOR EXAM:    fx hip  COMMENTS:       PROCEDURE: DXR - DXR CHEST 1 VIEWAP OR PA  - Oct 08 2011  9:52AM     RESULT: The lung fields are clear. The heart, mediastinal and osseous   structures show no acute changes. The heart size is normal. Thereis a   mild to moderate thoracolumbar scoliosis.    IMPRESSION:   1. No acute changes are identified.  2. There is a mild to  moderate thoracolumbar scoliosis.  3. Heart size is normal.  4. The lungs appear mildly hyperexpanded bilaterally.    Thank you for the opportunity to contribute to the care of your patient.           Verified By: Dionne Ano WALL, M.D., MD    18-Feb-13 09:56, Hip Left Complete   Hip Left Complete   REASON FOR EXAM:    pain s/p fall  COMMENTS:       PROCEDURE: DXR - DXR HIP LEFT COMPLETE  - Oct 08 2011  9:56AM     RESULT: There is an intertrochanteric fracture of the left hip. Varus   angulation is observed at the fracture site. Bony fracture components   currently appear minimally displaced.    IMPRESSION:     Intertrochanteric fracture of the left hip.    Thank you for the opportunity to contribute to the care of your patient.       Verified By: Dionne Ano WALL, M.D., MD    18-Feb-13 09:56, Knee Left Complete   Knee Left Complete   REASON FOR EXAM:    pain s/p fall  COMMENTS:       PROCEDURE: DXR - DXR KNEE LT COMP WITH OBLIQUES  - Oct 08 2011  9:56AM     RESULT: No fracture, dislocation or other acute bony abnormality is   identified. The knee joint space appears narrowed medially but no   sclerosis of the adjacent articular surface is seen. No periarticular   cystic changes are noted. The patella is intact.    IMPRESSION:     No acute bony abnormalities are identified.    Thank you for the opportunity to contribute to thecare of your patient.     Verified By: Dionne Ano WALL, M.D., MD    18-Feb-13 09:56, Pelvis AP Only   Pelvis AP Only   REASON FOR EXAM:    pain s/p fall  COMMENTS:       PROCEDURE: DXR - DXR PELVIS AP ONLY  - Oct 08 2011  9:56AM     RESULT: A left hip fracture is noted. No additional fractures  of the bony   pelvis are seen. The right hip is normal in appearance.    IMPRESSION:   1. Fracture of the left hip.  2. No additional acute bony abnormalities of the bony pelvis are seen.    Thank you for the opportunity to contribute to the care of your  patient.         Verified By: Dionne Ano WALL, M.D., MD     Assessment/Admission Diagnosis 1. Left intertrochanteric hip fracture 2. DM 3. HTN 4. Cardiac murmur    Plan Proceed with reduction and internalfixation of left hip  Risks discussed, info given. Site marked. Also discussed blood transfusion   Electronic Signatures: Lynnda Shields (MD)  (Signed 18-Feb-13 12:46)  Authored: CHIEF COMPLAINT and HISTORY, PAST MEDICAL/SURGIAL HISTORY, ALLERGIES, HOME MEDICATIONS, FAMILY AND SOCIAL HISTORY, REVIEW OF SYSTEMS, PHYSICAL EXAM, LABS, Radiology, ASSESSMENT AND PLAN   Last Updated: 18-Feb-13 12:46 by Lynnda Shields (MD)

## 2014-12-12 NOTE — Discharge Summary (Signed)
PATIENT NAME:  Lorraine Fischer, PETRUCCI MR#:  [id number] DATE OF BIRTH:  1923-05-26  DATE OF ADMISSION:  10/08/2011 DATE OF DISCHARGE:  10/11/2011  ADMITTING DIAGNOSIS: Intertrochanteric fracture left hip.   DISCHARGE DIAGNOSIS: Intertrochanteric fracture left hip.   OPERATION: On 10/08/2011, the patient had an open reduction and internal fixation of the left hip using intramedullary hip screw. Surgeon was Dr. Mauri Pole. No assistant. Anesthesia was spinal. Estimated blood loss was 110 mL. Replacement was 600 mL of crystalloid. No drains. No complications. Implants used: Stryker 130-degree short gamma 3 nail, 85 mm lag screw, 35 mm distal screw. The patient was stabilized and brought to the Orthopedic Floor and cleared for surgery and then brought to surgery that same day.   HISTORY: The patient is an 81 year old female who presented after losing her balance, falling, and injuring her left hip. The patient had no loss of consciousness but could not ambulate.   PHYSICAL EXAMINATION: GENERAL: A well-developed female with mild discomfort. CARDIAC: Murmur present with regular rate. RESPIRATIONS: Clear. MUSCULOSKELETAL: In regard to the left lower extremity, there is pain to any palpation, any attempted motion. The patient has external rotation of the hip, and the knee is benign. NEUROVASCULAR: The patient had neurovascular intact.   HOSPITAL COURSE: After initial admission on 10/08/2011, the patient was brought to the Orthopedic Floor. The patient had surgery the same day of admission. The patient's hemoglobin primarily before surgery was at 12.5, dropped down to 9.1 the day after surgery, and went to 9.4 that evening; and on postoperative day two it was at 8.6 with no transfusion given. The patient did have a hemoglobin scheduled before discharge. The patient did well with physical therapy, ambulating initially bed-to-chair and progressed up to ambulating 6 feet.   CONDITION AT DISCHARGE: Stable.   DISPOSITION: The  patient was sent to rehab.    DISCHARGE INSTRUCTIONS:  1. The patient will follow up with [facility] in two weeks with Dr. Mauri Pole.  2. The patient will do physical therapy, doing left weight bear as tolerated, and occupational therapy doing activities of daily living.  3. Diet is an 1800 calorie diet.  4. Activity is weight bear as tolerated.  5. Dressings changed on a p.r.n. basis.   DISCHARGE MEDICATIONS:  1. Tylenol Extra Strength 500 to 1000 mg every 4 hours as needed for pain or fever greater than 100.4.  2. Dulcolax suppository 10 mg PR p.r.n.  3. Cardizem CD 180 mg p.o. daily.  4. Docusate/Senokot-S 1 tablet p.o. b.i.d.  5. Lovenox 30 mg subcutaneous every 12 hours and discontinue after 24 days.  6. Insulin sliding scale with NovoLog injection. 7. Synthroid 0.05 mg p.o. at 6 a.m.  8. Mevacor 40 mg p.o. with supper.  9. Milk of magnesia 30 mL p.o. b.i.d. or p.r.n.  10. Glucophage 500 mg p.o. b.i.d.  11. Oxycodone 5 mg every 4 to 6 hours p.r.n. for severe pain.  12. Bactrim DS 1 tablet p.o. daily x7 days.  13. Trinsicon 1 capsule p.o. b.i.d.  14. Ambien 10 mg p.o. at bedtime p.r.n. for insomnia.   ____________________________ Lenna Sciara. Reche Dixon, [place] jtm:cbb D: 10/10/2011 18:43:10 ET T: 10/10/2011 19:27:23 ET JOB#: [id number]  cc: J. Reche Dixon, [place], <Dictator> J Del Wiseman [facility] PA ELECTRONICALLY SIGNED 10/17/2011 12:17

## 2014-12-12 NOTE — Consult Note (Signed)
PATIENT NAME:  Lorraine Fischer, Lorraine Fischer MR#:  [id number] DATE OF BIRTH:  [birth year]  DATE OF CONSULTATION:  10/08/2011  REFERRING PHYSICIAN:  Dr. Mauri Pole  CONSULTING PHYSICIAN:  Brandie Lopes H. Posey Pronto, MD  PRIMARY CARE PHYSICIAN: Dr. Otilio Miu   REASON FOR CONSULTATION: Opinion regarding patient's diabetes, hypothyroidism, hypertension as well as preop evaluation.   HISTORY OF PRESENT ILLNESS: Patient is a pleasant 82 year old white female with history of hypertension, chronic back pain, osteoarthritis, hypothyroidism, chronic recurrent urinary tract infections who reports that she was feeling okay when today she was in the kitchen and her son called her so she turned and then subsequently fell. She did not have a syncopal episode. She did not feel dizzy prior to this episode. She was brought to the ED and was noted to have a left hip intertrochanteric fracture. She otherwise reports that she does occasionally have right-sided chest pain related to arthritis which she has not had any chest pain recently. She denies any left-sided chest pain. She reports that the only cardiac history she has is she has been diagnosed with a heart murmur multiple years ago as well as had some sort of arrhythmia about 10 years ago in 1997. She is not sure what type of arrhythmia that was but did require 3 to 4 days of hospital stay at [facility]. She has not had any further issues with that. She otherwise does complain of dyspnea on exertion with heavy exertion but normally is not short of breath. She denies any nocturnal dyspnea, orthopnea. No lower extremity swelling. No calf pain. Denies any fevers or chills. She does complain of having recurrent urinary tract infections and has had difficulties with frequency with urination which has been a recurrent issue. She also complains of some burning with urination.   PAST MEDICAL HISTORY:  1. Hypertension.  2. Chronic back pain.  3. Osteoarthritis.  4. Hypothyroidism.  5. History of having  right kidney stent.  6. History of having some sort of arrhythmia, likely atrial fibrillation.  7. History of chronic plantar fasciitis.  8. Legal blindness.  9. Borderline diabetes.   PAST SURGICAL HISTORY:  1. History of surgery for retinal pathology.  2. Status post hysterectomy. 3. Status post left carotid endarterectomy four years ago. 4. Status post right wrist fracture repair with a pin.   ALLERGIES: None.   CURRENT MEDICATIONS:  1. Aspirin 81, 1 tab p.o. daily.  2. Colon cleanser capsule three caps daily as needed.  3. Diltiazem 180 mg daily.  4. Fish oil 1000 mg 1 tab p.o. daily.  5. Levothyroxine 50 mcg daily.  6. Lisinopril 40 daily.  7. Lovastatin 40 mg at bedtime.  8. Metformin 500 once in the morning.  9. Multivitamin 1 tab p.o. daily.  10. Nitrofurantoin 100 mg daily.  11. Ambien 10 mg at bedtime.    SOCIAL HISTORY: Does not smoke. Does not drink. Lives with her son.   FAMILY HISTORY: Positive for hypertension.    REVIEW OF SYSTEMS: CONSTITUTIONAL: Denies any fevers. No fatigue. Has occasional weakness. Has pain in her left hip. No weight loss. No weight gain. EYES: No blurred or double vision. No pain. No redness. No inflammation. Does have history of legal blindness. ENT: No tinnitus. No ear pain. No hearing loss. No seasonal or year-round allergies. No epistaxis. No nasal discharge. No snoring. No difficulty swallowing. RESPIRATORY: No cough. No wheezing. No hemoptysis. No chronic obstructive pulmonary disease. No tuberculosis. CARDIOVASCULAR: No chest pain. No orthopnea. No edema. Some sort of  arrhythmia, likely paroxysmal atrial fibrillation in the past in 1997, none since. GASTROINTESTINAL: No nausea, vomiting, diarrhea. No abdominal pain. No hematemesis. No melena. No ulcer. No gastroesophageal reflux disease. No changes in bowel habits. GENITOURINARY: Denies any hematuria. Complains of frequency and some burning which is a chronic problem. ENDO: Denies any  polyuria, nocturia, or thyroid problems. No increase in sweating, heat or cold intolerance. HEME/LYMPH: Denies any anemia, easy bruisability, or bleeding. SKIN: No acne. No rash. No changes in hair or skin. MUSCULOSKELETAL: Has pain in her shoulders, multiple other areas of her body related to osteoarthritis. No history of gout. NEUROLOGICAL: No numbness. No cerebrovascular accident. No transient ischemic attack. No seizures. PSYCHIATRIC: No anxiety. No insomnia. No ADD, OCD.   PHYSICAL EXAMINATION:  VITAL SIGNS: Temperature 98.6, pulse 76, respirations 18, blood pressure of 203/95, O2 98% on room air. Current blood pressure is 128/65.   GENERAL: Patient currently is comfortable, in no acute distress, is an elderly female appears her stated age.   HEENT: Head atraumatic, normocephalic. Pupils equally round, reactive to light and accommodation. Extraocular movements intact. There is no conjunctival pallor. No scleral icterus. Oropharynx is clear without any exudates. Ear exam shows no drainage or ulceration.   NECK: There is radiation of the murmur to the left neck. There is no thyromegaly.   CARDIOVASCULAR: Regular rate and rhythm. PMI is not displaced. There is a murmur at the right sternal border, systolic, 2/6 murmur.   LUNGS: No accessory muscle usage. Clear to auscultation bilaterally without any rales, rhonchi, wheezing.   ABDOMEN: Soft, nontender, nondistended. Positive bowel sounds x4.   EXTREMITIES: She has got no clubbing, cyanosis, edema.   NEUROLOGIC: Awake, alert, oriented x3. No focal deficits.   SKIN: There is no rash.   LYMPHATICS: No lymph nodes palpable.   VASCULAR: Good DP, PT pulses.   PSYCHIATRIC: Not anxious or depressed.   LABORATORY, DIAGNOSTIC, AND RADIOLOGICAL DATA: Glucose 144, BUN 19, creatinine 0.81, sodium 141, potassium 4.1, chloride 106, CO2 26, calcium 9.4, WBC 9.3, hemoglobin 12.5, platelet count 196. Chest x-ray showed no acute cardiopulmonary  processes.   ASSESSMENT AND PLAN: Patient is an 81 year old white female with history of hypertension, hypothyroidism, heart murmur, fell in the kitchen and has left hip fracture.  1. Left hip fracture. Patient is at moderate risk of surgery in light of her advanced age and other medical issues. At this time she has no cardiopulmonary symptoms. She does have a heart murmur which has been evaluated four or five years ago. Likely aortic stenosis. At this time will get an EKG. If her EKG is nonrevealing patient may proceed to surgery. Will also obtain an echo of the heart. Her surgery does not need to be postponed for the echo results. At this time would recommend continuing her lovastatin and diltiazem with history of arrhythmia in the past for prevention of postoperative arrhythmia. Will also check urinalysis, make sure she doesn't  have a urinary tract infection; treat it if she has a urinary tract infection.   2. Hypertension, accelerated on presentation, now improved, likely due to pain. At this time will continue diltiazem, hold lisinopril, resume likely in the next day or two. Will add p.r.n. hydralazine for such severely elevated blood pressure.  3. Hypothyroidism. Continue levothyroxine.  4. Hyperlipidemia. Continue with lovastatin.  5. Borderline diabetes. Sliding scale insulin for now. Hold metformin, resume postoperative.  6. MISCELLANEOUS: Recommend deep vein thrombosis prophylaxis as well as incentive spirometry postoperatively.   TIME SPENT: 35  minutes.   ____________________________ Lafonda Mosses Posey Pronto, MD shp:cms D: 10/08/2011 11:51:10 ET T: 10/08/2011 12:08:47 ET JOB#: [id number]  cc: Miciah Shealy H. Posey Pronto, MD, <Dictator> Juline Patch, MD Alric Seton MD ELECTRONICALLY SIGNED 10/26/2011 7:49

## 2014-12-12 NOTE — Op Note (Signed)
PATIENT NAME:  Lorraine Fischer, Lorraine Fischer MR#:  [id number] DATE OF BIRTH:  13-Apr-1923  DATE OF PROCEDURE:  10/08/2011  PREOPERATIVE DIAGNOSIS: Intertrochanteric fracture, left hip.   POSTOPERATIVE DIAGNOSIS: Intertrochanteric fracture, left hip.   PROCEDURE: Open fixation intertrochanteric fracture, left hip, using intramedullary hip screw.   SURGEON: Alysia Penna. Anjelo Pullman, MD   ASSISTANT: None.   ANESTHESIA: Spinal.   ESTIMATED BLOOD LOSS: 110 mL.  REPLACEMENT: 600 mL of Crystalloid.   DRAINS: None.   COMPLICATIONS: None.   IMPLANTS USED: Stryker 130 degree short gamma 3 nail, 85 mm lag screw, 35 mm distal screw.   BRIEF CLINICAL NOTE AND PATHOLOGY: The patient fell on the day of surgery suffering the above-mentioned fracture. She was cleared for surgery and brought to the operating room. The fracture reduced very nicely. Hardware had good fixation. Bone was fairly soft.   PROCEDURE: Preop antibiotics, adequate anesthesia, patient transferred to fracture table, right leg placed in the well legholder. Traction was applied, adduction, internal rotation. AP and lateral views showed satisfactory reduction. Routine prepping and draping was then performed.   Using fluoroscopic guidance, a small incision was made proximal to the greater trochanter. The guidewire was then introduced to the greater trochanter down the shaft. AP and lateral views showed good positioning. Using the soft tissue protector, tapered reaming was then progressed. The nail was then inserted. The guidewire was placed at the neck into the head. It was checked in AP and lateral views. Length was measured, drilling was performed, and the lag screw was then inserted. The proximal interference screw was placed, backed off one-quarter turn and compression was performed. The distal screw was then placed in a routine fashion through a separate incision. The incisions were thoroughly irrigated. They were closed with 0 Vicryl followed by staples. A  soft sterile dressing was applied. Sponge, instrument, and needle counts were reported as correct prior to and after wound closure. The patient was awakened and taken to the PAC-U having tolerated the procedure well.   ____________________________ Alysia Penna. Mauri Pole, MD jcc:drc D: 10/08/2011 19:02:55 ET T: 10/09/2011 08:42:22 ET JOB#: [id number]  cc: Alysia Penna. Mauri Pole, MD, <Dictator> Alysia Penna Jametta Moorehead MD ELECTRONICALLY SIGNED 10/09/2011 13:23

## 2014-12-22 DIAGNOSIS — E119 Type 2 diabetes mellitus without complications: Secondary | ICD-10-CM | POA: Diagnosis not present

## 2015-02-01 DIAGNOSIS — Z85828 Personal history of other malignant neoplasm of skin: Secondary | ICD-10-CM | POA: Diagnosis not present

## 2015-02-01 DIAGNOSIS — Z08 Encounter for follow-up examination after completed treatment for malignant neoplasm: Secondary | ICD-10-CM | POA: Diagnosis not present

## 2015-02-01 DIAGNOSIS — D2261 Melanocytic nevi of right upper limb, including shoulder: Secondary | ICD-10-CM | POA: Diagnosis not present

## 2015-02-01 DIAGNOSIS — D485 Neoplasm of uncertain behavior of skin: Secondary | ICD-10-CM | POA: Diagnosis not present

## 2015-02-01 DIAGNOSIS — Z1283 Encounter for screening for malignant neoplasm of skin: Secondary | ICD-10-CM | POA: Diagnosis not present

## 2015-02-01 DIAGNOSIS — S40261A Insect bite (nonvenomous) of right shoulder, initial encounter: Secondary | ICD-10-CM | POA: Diagnosis not present

## 2015-02-01 DIAGNOSIS — L570 Actinic keratosis: Secondary | ICD-10-CM | POA: Diagnosis not present

## 2015-02-23 DIAGNOSIS — D485 Neoplasm of uncertain behavior of skin: Secondary | ICD-10-CM | POA: Diagnosis not present

## 2015-02-23 DIAGNOSIS — D225 Melanocytic nevi of trunk: Secondary | ICD-10-CM | POA: Diagnosis not present

## 2015-04-29 DIAGNOSIS — H6123 Impacted cerumen, bilateral: Secondary | ICD-10-CM | POA: Diagnosis not present

## 2015-04-29 DIAGNOSIS — H6063 Unspecified chronic otitis externa, bilateral: Secondary | ICD-10-CM | POA: Diagnosis not present

## 2015-05-03 DIAGNOSIS — H2511 Age-related nuclear cataract, right eye: Secondary | ICD-10-CM | POA: Diagnosis not present

## 2015-05-11 DIAGNOSIS — L910 Hypertrophic scar: Secondary | ICD-10-CM | POA: Diagnosis not present

## 2015-05-17 DIAGNOSIS — E119 Type 2 diabetes mellitus without complications: Secondary | ICD-10-CM | POA: Diagnosis not present

## 2015-06-06 ENCOUNTER — Other Ambulatory Visit: Payer: Self-pay | Admitting: Family Medicine

## 2015-07-05 ENCOUNTER — Encounter: Payer: Self-pay | Admitting: Family Medicine

## 2015-07-05 ENCOUNTER — Ambulatory Visit (INDEPENDENT_AMBULATORY_CARE_PROVIDER_SITE_OTHER): Payer: Medicare Other | Admitting: Family Medicine

## 2015-07-05 VITALS — BP 130/70 | HR 64 | Ht 59.0 in | Wt 113.0 lb

## 2015-07-05 DIAGNOSIS — E119 Type 2 diabetes mellitus without complications: Secondary | ICD-10-CM | POA: Diagnosis not present

## 2015-07-05 DIAGNOSIS — I10 Essential (primary) hypertension: Secondary | ICD-10-CM

## 2015-07-05 DIAGNOSIS — E039 Hypothyroidism, unspecified: Secondary | ICD-10-CM | POA: Diagnosis not present

## 2015-07-05 DIAGNOSIS — R69 Illness, unspecified: Secondary | ICD-10-CM | POA: Diagnosis not present

## 2015-07-05 DIAGNOSIS — E785 Hyperlipidemia, unspecified: Secondary | ICD-10-CM | POA: Diagnosis not present

## 2015-07-05 MED ORDER — METFORMIN HCL 500 MG PO TABS: 500.0000 mg | ORAL_TABLET | Freq: Every day | ORAL | Status: DC

## 2015-07-05 MED ORDER — HYDROCHLOROTHIAZIDE 12.5 MG PO TABS: 12.5000 mg | ORAL_TABLET | ORAL | Status: DC | PRN

## 2015-07-05 MED ORDER — LEVOTHYROXINE SODIUM 50 MCG PO TABS: 50.0000 ug | ORAL_TABLET | Freq: Every day | ORAL | Status: DC

## 2015-07-05 MED ORDER — DILTIAZEM HCL ER COATED BEADS 240 MG PO CP24: 240.0000 mg | ORAL_CAPSULE | Freq: Every day | ORAL | Status: DC

## 2015-07-05 MED ORDER — LOVASTATIN 40 MG PO TABS: 40.0000 mg | ORAL_TABLET | Freq: Every day | ORAL | Status: DC

## 2015-07-05 MED ORDER — LISINOPRIL 40 MG PO TABS: 40.0000 mg | ORAL_TABLET | Freq: Every day | ORAL | Status: DC

## 2015-07-05 NOTE — Progress Notes (Signed)
Name: Lorraine Fischer   MRN: [id number]    DOB: September 25, 1922   Date:07/05/2015       Progress Note  Subjective  Chief Complaint  Chief Complaint  Patient presents with  . Diabetes  . Hypertension  . Hypothyroidism  . Hyperlipidemia    Diabetes She presents for her follow-up diabetic visit. She has type 2 diabetes mellitus. Her disease course has been stable. There are no hypoglycemic associated symptoms. Pertinent negatives for diabetes include no blurred vision, no chest pain, no fatigue, no foot paresthesias, no foot ulcerations, no polydipsia, no polyphagia, no polyuria, no visual change, no weakness and no weight loss. There are no hypoglycemic complications. Symptoms are stable. There are no diabetic complications. Pertinent negatives for diabetic complications include no autonomic neuropathy, CVA, heart disease, impotence, nephropathy, peripheral neuropathy, PVD or retinopathy. Risk factors for coronary artery disease include post-menopausal and hypertension. Current diabetic treatment includes oral agent (monotherapy). She is compliant with treatment all of the time. Her weight is stable. She does not see a podiatrist.Eye exam is not current.  Hypertension This is a chronic problem. The current episode started more than 1 year ago. Pertinent negatives include no blurred vision, chest pain or shortness of breath. Past treatments include beta blockers and ACE inhibitors. The current treatment provides no improvement. There are no compliance problems.  There is no history of angina, kidney disease, CAD/MI, CVA, heart failure, left ventricular hypertrophy, PVD, renovascular disease or retinopathy. There is no history of chronic renal disease or a hypertension causing med.  Hyperlipidemia This is a chronic problem. The current episode started more than 1 year ago. The problem is controlled. She has no history of chronic renal disease, diabetes, hypothyroidism, liver disease, obesity or nephrotic  syndrome. Pertinent negatives include no chest pain, focal sensory loss, focal weakness, leg pain, myalgias or shortness of breath. Current antihyperlipidemic treatment includes statins. The current treatment provides mild improvement of lipids. There are no compliance problems.  Risk factors for coronary artery disease include dyslipidemia.    No problem-specific assessment & plan notes found for this encounter.   Past Medical History  Diagnosis Date  . Hypertension   . Diabetes mellitus without complication ([place])   . Hyperlipidemia   . Thyroid disease     History reviewed. No pertinent past surgical history.  History reviewed. No pertinent family history.  Social History   Social History  . Marital Status: Widowed    Spouse Name: N/A  . Number of Children: N/A  . Years of Education: N/A   Occupational History  . Not on file.   Social History Main Topics  . Smoking status: Never Smoker   . Smokeless tobacco: Never Used  . Alcohol Use: No  . Drug Use: No  . Sexual Activity: Not Currently   Other Topics Concern  . Not on file   Social History Narrative    No Known Allergies   Review of Systems  Constitutional: Negative for weight loss and fatigue.  Eyes: Negative for blurred vision.  Respiratory: Negative for shortness of breath.   Cardiovascular: Negative for chest pain.  Genitourinary: Negative for impotence.  Musculoskeletal: Negative for myalgias.  Neurological: Negative for focal weakness and weakness.  Endo/Heme/Allergies: Negative for polydipsia and polyphagia.     Objective  Filed Vitals:   07/05/15 1533  BP: 130/70  Pulse: 64  Height: 4\' 11"  (1.499 m)  Weight: 113 lb (51.256 kg)    Physical Exam  Constitutional: She is well-developed, well-nourished, and  in no distress. No distress.  HENT:  Head: Normocephalic and atraumatic.  Right Ear: External ear normal.  Left Ear: External ear normal.  Nose: Nose normal.  Mouth/Throat: Oropharynx  is clear and moist.  Eyes: Conjunctivae and EOM are normal. Pupils are equal, round, and reactive to light. Right eye exhibits no discharge. Left eye exhibits no discharge.  Neck: Normal range of motion. Neck supple. No JVD present. No thyromegaly present.  Cardiovascular: Normal rate, regular rhythm, normal heart sounds and intact distal pulses.  Exam reveals no gallop and no friction rub.   No murmur heard. Pulmonary/Chest: Effort normal and breath sounds normal.  Abdominal: Soft. Bowel sounds are normal. She exhibits no mass. There is no tenderness. There is no guarding.  Musculoskeletal: Normal range of motion. She exhibits no edema.  Lymphadenopathy:    She has no cervical adenopathy.  Neurological: She is alert. She has normal reflexes.  Skin: Skin is warm and dry. She is not diaphoretic.  Psychiatric: Mood and affect normal.      Assessment & Plan  Problem List Items Addressed This Visit    None    Visit Diagnoses    Essential hypertension    -  Primary    Relevant Medications    diltiazem (CARTIA XT) 240 MG 24 hr capsule    lisinopril (PRINIVIL,ZESTRIL) 40 MG tablet    lovastatin (MEVACOR) 40 MG tablet    hydrochlorothiazide (HYDRODIURIL) 12.5 MG tablet    Other Relevant Orders    Renal Function Panel    Hyperlipidemia        Relevant Medications    diltiazem (CARTIA XT) 240 MG 24 hr capsule    lisinopril (PRINIVIL,ZESTRIL) 40 MG tablet    lovastatin (MEVACOR) 40 MG tablet    hydrochlorothiazide (HYDRODIURIL) 12.5 MG tablet    Other Relevant Orders    Lipid Profile    Hepatic function panel    Type 2 diabetes mellitus without complication, without long-term current use of insulin (HCC)        Relevant Medications    lisinopril (PRINIVIL,ZESTRIL) 40 MG tablet    lovastatin (MEVACOR) 40 MG tablet    metFORMIN (GLUCOPHAGE) 500 MG tablet    Other Relevant Orders    HgB A1c    Hypothyroidism, unspecified hypothyroidism type        Relevant Medications     levothyroxine (SYNTHROID, LEVOTHROID) 50 MCG tablet    Other Relevant Orders    TSH    Taking medication for chronic disease        Relevant Orders    Renal Function Panel    Lipid Profile    Hepatic function panel         Dr. Deanna Jones [facility]  07/05/2015

## 2015-07-06 ENCOUNTER — Other Ambulatory Visit: Payer: Self-pay | Admitting: Family Medicine

## 2015-07-06 LAB — LIPID PANEL
CHOL/HDL RATIO: 3.7 ratio (ref 0.0–4.4)
CHOLESTEROL TOTAL: 191 mg/dL (ref 100–199)
HDL: 52 mg/dL (ref >39)
LDL CALC: 108 mg/dL — AB (ref 0–99)
TRIGLYCERIDES: 156 mg/dL — AB (ref 0–149)
VLDL CHOLESTEROL CAL: 31 mg/dL (ref 5–40)

## 2015-07-06 LAB — HEPATIC FUNCTION PANEL
ALK PHOS: 67 IU/L (ref 39–117)
ALT: 22 IU/L (ref 0–32)
AST: 19 IU/L (ref 0–40)
BILIRUBIN, DIRECT: 0.09 mg/dL (ref 0.00–0.40)
Bilirubin Total: <0.2 mg/dL (ref 0.0–1.2)
Total Protein: 6.6 g/dL (ref 6.0–8.5)

## 2015-07-06 LAB — RENAL FUNCTION PANEL
ALBUMIN: 4.2 g/dL (ref 3.2–4.6)
BUN / CREAT RATIO: 29 — AB (ref 11–26)
BUN: 22 mg/dL (ref 10–36)
CHLORIDE: 100 mmol/L (ref 97–106)
CO2: 26 mmol/L (ref 18–29)
CREATININE: 0.75 mg/dL (ref 0.57–1.00)
Calcium: 11 mg/dL — ABNORMAL HIGH (ref 8.7–10.3)
GFR, EST AFRICAN AMERICAN: 81 mL/min/{1.73_m2} (ref >59)
GFR, EST NON AFRICAN AMERICAN: 70 mL/min/{1.73_m2} (ref >59)
Glucose: 166 mg/dL — ABNORMAL HIGH (ref 65–99)
Phosphorus: 3.5 mg/dL (ref 2.5–4.5)
Potassium: 4.4 mmol/L (ref 3.5–5.2)
Sodium: 141 mmol/L (ref 136–144)

## 2015-07-06 LAB — HEMOGLOBIN A1C
Est. average glucose Bld gHb Est-mCnc: 134 mg/dL
Hgb A1c MFr Bld: 6.3 % — ABNORMAL HIGH (ref 4.8–5.6)

## 2015-07-06 LAB — TSH: TSH: 2.99 u[IU]/mL (ref 0.450–4.500)

## 2015-07-28 ENCOUNTER — Other Ambulatory Visit: Payer: Self-pay | Admitting: Family Medicine

## 2015-08-17 DIAGNOSIS — E119 Type 2 diabetes mellitus without complications: Secondary | ICD-10-CM | POA: Diagnosis not present

## 2015-10-11 ENCOUNTER — Encounter: Payer: Self-pay | Admitting: Family Medicine

## 2015-10-11 ENCOUNTER — Ambulatory Visit (INDEPENDENT_AMBULATORY_CARE_PROVIDER_SITE_OTHER): Payer: Medicare Other | Admitting: Family Medicine

## 2015-10-11 VITALS — BP 130/64 | HR 64 | Ht 59.0 in | Wt 112.0 lb

## 2015-10-11 DIAGNOSIS — E119 Type 2 diabetes mellitus without complications: Secondary | ICD-10-CM

## 2015-10-11 DIAGNOSIS — E785 Hyperlipidemia, unspecified: Secondary | ICD-10-CM | POA: Diagnosis not present

## 2015-10-11 DIAGNOSIS — N951 Menopausal and female climacteric states: Secondary | ICD-10-CM

## 2015-10-11 DIAGNOSIS — E039 Hypothyroidism, unspecified: Secondary | ICD-10-CM

## 2015-10-11 DIAGNOSIS — I10 Essential (primary) hypertension: Secondary | ICD-10-CM

## 2015-10-11 MED ORDER — LEVOTHYROXINE SODIUM 50 MCG PO TABS: 50.0000 ug | ORAL_TABLET | Freq: Every day | ORAL | Status: DC

## 2015-10-11 MED ORDER — METFORMIN HCL 500 MG PO TABS: 500.0000 mg | ORAL_TABLET | Freq: Every day | ORAL | Status: DC

## 2015-10-11 MED ORDER — LISINOPRIL 40 MG PO TABS: 40.0000 mg | ORAL_TABLET | Freq: Every day | ORAL | Status: DC

## 2015-10-11 MED ORDER — LOVASTATIN 40 MG PO TABS: 40.0000 mg | ORAL_TABLET | Freq: Every day | ORAL | Status: DC

## 2015-10-11 NOTE — Progress Notes (Signed)
Name: Lorraine Fischer   MRN: [id number]    DOB: [birth year]   Date:10/11/2015       Progress Note  Subjective  Chief Complaint  Chief Complaint  Patient presents with  . Hyperlipidemia  . Hypertension  . Diabetes  . Hypothyroidism    Hyperlipidemia This is a chronic problem. The current episode started more than 1 year ago. The problem is controlled. Recent lipid tests were reviewed and are normal. She has no history of chronic renal disease, diabetes, hypothyroidism, liver disease, obesity or nephrotic syndrome. Factors aggravating her hyperlipidemia include thiazides. Pertinent negatives include no chest pain, focal sensory loss, focal weakness, leg pain, myalgias or shortness of breath. She is currently on no antihyperlipidemic treatment. The current treatment provides mild improvement of lipids. There are no compliance problems.  Risk factors for coronary artery disease include diabetes mellitus, dyslipidemia and hypertension.  Hypertension This is a chronic problem. The current episode started more than 1 year ago. The problem has been waxing and waning since onset. The problem is controlled. Pertinent negatives include no anxiety, blurred vision, chest pain, headaches, malaise/fatigue, neck pain, orthopnea, palpitations, peripheral edema, PND, shortness of breath or sweats. There are no associated agents to hypertension. Risk factors for coronary artery disease include post-menopausal state, dyslipidemia and diabetes mellitus. The current treatment provides no improvement. There are no compliance problems.  Hypertensive end-organ damage includes a thyroid problem. There is no history of angina, kidney disease, CAD/MI, CVA, heart failure, left ventricular hypertrophy, PVD, renovascular disease or retinopathy. There is no history of chronic renal disease or a hypertension causing med.  Diabetes She presents for her follow-up diabetic visit. She has type 2 diabetes mellitus. MedicAlert  identification noted. Pertinent negatives for hypoglycemia include no dizziness, headaches, nervousness/anxiousness or sweats. Pertinent negatives for diabetes include no blurred vision, no chest pain, no fatigue, no foot paresthesias, no foot ulcerations, no polydipsia, no polyphagia, no polyuria, no visual change, no weakness and no weight loss. Symptoms are stable. Pertinent negatives for diabetic complications include no CVA, PVD or retinopathy. Current diabetic treatment includes diet and oral agent (monotherapy). An ACE inhibitor/angiotensin II receptor blocker is being taken. She does not see a podiatrist.Eye exam is current.  Thyroid Problem Presents for follow-up visit. Patient reports no anxiety, cold intolerance, constipation, depressed mood, diaphoresis, diarrhea, fatigue, hair loss, heat intolerance, palpitations, visual change, weight gain or weight loss. The symptoms have been stable. Past treatments include levothyroxine. Her past medical history is significant for hyperlipidemia. There is no history of diabetes or heart failure.    No problem-specific assessment & plan notes found for this encounter.   Past Medical History  Diagnosis Date  . Hypertension   . Diabetes mellitus without complication ([place])   . Hyperlipidemia   . Thyroid disease     History reviewed. No pertinent past surgical history.  History reviewed. No pertinent family history.  Social History   Social History  . Marital Status: Widowed    Spouse Name: N/A  . Number of Children: N/A  . Years of Education: N/A   Occupational History  . Not on file.   Social History Main Topics  . Smoking status: Never Smoker   . Smokeless tobacco: Never Used  . Alcohol Use: No  . Drug Use: No  . Sexual Activity: Not Currently   Other Topics Concern  . Not on file   Social History Narrative    Allergies  Allergen Reactions  . Sulfa Antibiotics Shortness Of Breath  Review of Systems  Constitutional:  Negative for fever, chills, weight loss, weight gain, malaise/fatigue, diaphoresis and fatigue.  HENT: Negative for ear discharge, ear pain and sore throat.   Eyes: Negative for blurred vision.  Respiratory: Negative for cough, sputum production, shortness of breath and wheezing.   Cardiovascular: Negative for chest pain, palpitations, orthopnea, leg swelling and PND.  Gastrointestinal: Negative for heartburn, nausea, abdominal pain, diarrhea, constipation, blood in stool and melena.  Genitourinary: Negative for dysuria, urgency, frequency and hematuria.  Musculoskeletal: Negative for myalgias, back pain, joint pain and neck pain.  Skin: Negative for rash.  Neurological: Negative for dizziness, tingling, sensory change, focal weakness, weakness and headaches.  Endo/Heme/Allergies: Negative for environmental allergies, cold intolerance, heat intolerance, polydipsia and polyphagia. Does not bruise/bleed easily.  Psychiatric/Behavioral: Negative for depression and suicidal ideas. The patient is not nervous/anxious and does not have insomnia.      Objective  Filed Vitals:   10/11/15 1414  BP: 130/64  Pulse: 64  Height: 4\' 11"  (1.499 m)  Weight: 112 lb (50.803 kg)    Physical Exam  Constitutional: She is well-developed, well-nourished, and in no distress. No distress.  HENT:  Head: Normocephalic and atraumatic.  Right Ear: External ear normal.  Left Ear: External ear normal.  Nose: Nose normal.  Mouth/Throat: Oropharynx is clear and moist.  Eyes: Conjunctivae and EOM are normal. Pupils are equal, round, and reactive to light. Right eye exhibits no discharge. Left eye exhibits no discharge.  Neck: Normal range of motion. Neck supple. No JVD present. No thyromegaly present.  Cardiovascular: Normal rate, regular rhythm, normal heart sounds and intact distal pulses.  Exam reveals no gallop and no friction rub.   No murmur heard. Pulmonary/Chest: Effort normal and breath sounds normal.   Abdominal: Soft. Bowel sounds are normal. She exhibits no mass. There is no tenderness. There is no guarding.  Musculoskeletal: Normal range of motion. She exhibits no edema.  Lymphadenopathy:    She has no cervical adenopathy.  Neurological: She is alert.  Skin: Skin is warm and dry. She is not diaphoretic.  Psychiatric: Mood and affect normal.      Assessment & Plan  Problem List Items Addressed This Visit    None    Visit Diagnoses    Type 2 diabetes mellitus without complication, without long-term current use of insulin (HCC)    -  Primary    Relevant Medications    lisinopril (PRINIVIL,ZESTRIL) 40 MG tablet    metFORMIN (GLUCOPHAGE) 500 MG tablet    lovastatin (MEVACOR) 40 MG tablet    Other Relevant Orders    Hemoglobin A1c    Microalbumin / creatinine urine ratio    Hypothyroidism, unspecified hypothyroidism type        Relevant Medications    levothyroxine (SYNTHROID, LEVOTHROID) 50 MCG tablet    Essential hypertension        Relevant Medications    lisinopril (PRINIVIL,ZESTRIL) 40 MG tablet    lovastatin (MEVACOR) 40 MG tablet    Other Relevant Orders    Renal Function Panel    Hyperlipidemia        Relevant Medications    lisinopril (PRINIVIL,ZESTRIL) 40 MG tablet    lovastatin (MEVACOR) 40 MG tablet    Other Relevant Orders    Lipid Profile    Menopausal state        Relevant Orders    DG Bone Density         Dr. Otilio Miu [facility]  10/11/2015

## 2015-10-12 LAB — LIPID PANEL
CHOLESTEROL TOTAL: 241 mg/dL — AB (ref 100–199)
Chol/HDL Ratio: 4.7 ratio units — ABNORMAL HIGH (ref 0.0–4.4)
HDL: 51 mg/dL (ref >39)
LDL CALC: 157 mg/dL — AB (ref 0–99)
TRIGLYCERIDES: 163 mg/dL — AB (ref 0–149)
VLDL CHOLESTEROL CAL: 33 mg/dL (ref 5–40)

## 2015-10-12 LAB — MICROALBUMIN / CREATININE URINE RATIO
Creatinine, Urine: 42.6 mg/dL
MICROALB/CREAT RATIO: 244.1 mg/g creat — ABNORMAL HIGH (ref 0.0–30.0)
Microalbumin, Urine: 104 ug/mL

## 2015-10-12 LAB — RENAL FUNCTION PANEL
ALBUMIN: 3.9 g/dL (ref 3.2–4.6)
BUN/Creatinine Ratio: 24 (ref 11–26)
BUN: 19 mg/dL (ref 10–36)
CALCIUM: 10 mg/dL (ref 8.7–10.3)
CO2: 24 mmol/L (ref 18–29)
CREATININE: 0.8 mg/dL (ref 0.57–1.00)
Chloride: 102 mmol/L (ref 96–106)
GFR calc Af Amer: 75 mL/min/{1.73_m2} (ref >59)
GFR calc non Af Amer: 65 mL/min/{1.73_m2} (ref >59)
Glucose: 90 mg/dL (ref 65–99)
PHOSPHORUS: 2.9 mg/dL (ref 2.5–4.5)
POTASSIUM: 4.2 mmol/L (ref 3.5–5.2)
SODIUM: 141 mmol/L (ref 134–144)

## 2015-10-12 LAB — HEMOGLOBIN A1C
ESTIMATED AVERAGE GLUCOSE: 134 mg/dL
Hgb A1c MFr Bld: 6.3 % — ABNORMAL HIGH (ref 4.8–5.6)

## 2015-10-13 ENCOUNTER — Ambulatory Visit (INDEPENDENT_AMBULATORY_CARE_PROVIDER_SITE_OTHER): Payer: Medicare Other

## 2015-10-13 DIAGNOSIS — Z23 Encounter for immunization: Secondary | ICD-10-CM

## 2015-10-17 ENCOUNTER — Ambulatory Visit
Admission: RE | Admit: 2015-10-17 | Discharge: 2015-10-17 | Disposition: A | Discharge status: Home / Self Care | Payer: Medicare Other | Source: Ambulatory Visit | Attending: Family Medicine | Admitting: Family Medicine

## 2015-10-17 DIAGNOSIS — Z780 Asymptomatic menopausal state: Secondary | ICD-10-CM | POA: Diagnosis not present

## 2015-10-17 DIAGNOSIS — N951 Menopausal and female climacteric states: Secondary | ICD-10-CM | POA: Diagnosis not present

## 2015-10-17 DIAGNOSIS — M810 Age-related osteoporosis without current pathological fracture: Secondary | ICD-10-CM | POA: Diagnosis not present

## 2015-10-17 DIAGNOSIS — M8580 Other specified disorders of bone density and structure, unspecified site: Secondary | ICD-10-CM | POA: Insufficient documentation

## 2015-11-08 ENCOUNTER — Encounter: Payer: Self-pay | Admitting: Family Medicine

## 2015-11-08 ENCOUNTER — Ambulatory Visit (INDEPENDENT_AMBULATORY_CARE_PROVIDER_SITE_OTHER): Payer: Medicare Other | Admitting: Family Medicine

## 2015-11-08 VITALS — BP 130/80 | HR 70 | Ht 59.0 in | Wt 112.0 lb

## 2015-11-08 DIAGNOSIS — I10 Essential (primary) hypertension: Secondary | ICD-10-CM

## 2015-11-08 DIAGNOSIS — M810 Age-related osteoporosis without current pathological fracture: Secondary | ICD-10-CM | POA: Insufficient documentation

## 2015-11-08 MED ORDER — ALENDRONATE SODIUM 70 MG PO TABS: 70.0000 mg | ORAL_TABLET | ORAL | Status: DC

## 2015-11-08 NOTE — Progress Notes (Signed)
Name: Lorraine Fischer   MRN: [id number]    DOB: [birth year]   Date:11/08/2015       Progress Note  Subjective  Chief Complaint  Chief Complaint  Patient presents with  . Follow-up    B/P    Hypertension This is a chronic problem. The current episode started in the past 7 days. The problem has been gradually improving since onset. The problem is controlled. Pertinent negatives include no anxiety, blurred vision, chest pain, headaches, malaise/fatigue, neck pain, orthopnea, palpitations, peripheral edema, PND, shortness of breath or sweats. There are no associated agents to hypertension. Risk factors for coronary artery disease include dyslipidemia, diabetes mellitus and post-menopausal state. Past treatments include ACE inhibitors. The current treatment provides moderate improvement. There are no compliance problems.  There is no history of angina, kidney disease, CAD/MI, CVA, heart failure, left ventricular hypertrophy, PVD, renovascular disease or retinopathy. There is no history of chronic renal disease or a hypertension causing med.    No problem-specific assessment & plan notes found for this encounter.   Past Medical History  Diagnosis Date  . Hypertension   . Diabetes mellitus without complication ([place])   . Hyperlipidemia   . Thyroid disease     History reviewed. No pertinent past surgical history.  History reviewed. No pertinent family history.  Social History   Social History  . Marital Status: Widowed    Spouse Name: N/A  . Number of Children: N/A  . Years of Education: N/A   Occupational History  . Not on file.   Social History Main Topics  . Smoking status: Never Smoker   . Smokeless tobacco: Never Used  . Alcohol Use: No  . Drug Use: No  . Sexual Activity: Not Currently   Other Topics Concern  . Not on file   Social History Narrative    Allergies  Allergen Reactions  . Sulfa Antibiotics Shortness Of Breath     Review of Systems  Constitutional:  Negative for fever, chills, weight loss and malaise/fatigue.  HENT: Negative for ear discharge, ear pain and sore throat.   Eyes: Negative for blurred vision.  Respiratory: Negative for cough, sputum production, shortness of breath and wheezing.   Cardiovascular: Negative for chest pain, palpitations, orthopnea, leg swelling and PND.  Gastrointestinal: Negative for heartburn, nausea, abdominal pain, diarrhea, constipation, blood in stool and melena.  Genitourinary: Negative for dysuria, urgency, frequency and hematuria.  Musculoskeletal: Negative for myalgias, back pain, joint pain and neck pain.  Skin: Negative for rash.  Neurological: Negative for dizziness, tingling, sensory change, focal weakness and headaches.  Endo/Heme/Allergies: Negative for environmental allergies and polydipsia. Does not bruise/bleed easily.  Psychiatric/Behavioral: Negative for depression and suicidal ideas. The patient is not nervous/anxious and does not have insomnia.      Objective  Filed Vitals:   11/08/15 1340  BP: 130/80  Pulse: 70  Height: 4\' 11"  (1.499 m)  Weight: 112 lb (50.803 kg)    Physical Exam  Constitutional: She is well-developed, well-nourished, and in no distress. No distress.  HENT:  Head: Normocephalic and atraumatic.  Right Ear: External ear normal.  Left Ear: External ear normal.  Nose: Nose normal.  Mouth/Throat: Oropharynx is clear and moist.  Eyes: Conjunctivae and EOM are normal. Pupils are equal, round, and reactive to light. Right eye exhibits no discharge. Left eye exhibits no discharge.  Neck: Normal range of motion. Neck supple. No JVD present. No thyromegaly present.  Cardiovascular: Normal rate, regular rhythm, normal heart sounds and intact  distal pulses.  Exam reveals no gallop and no friction rub.   No murmur heard. Pulmonary/Chest: Effort normal and breath sounds normal.  Abdominal: Soft. Bowel sounds are normal. She exhibits no mass. There is no tenderness. There  is no guarding.  Musculoskeletal: Normal range of motion. She exhibits no edema.  Lymphadenopathy:    She has no cervical adenopathy.  Neurological: She is alert. She has normal reflexes.  Skin: Skin is warm and dry. She is not diaphoretic.  Psychiatric: Mood and affect normal.      Assessment & Plan  Problem List Items Addressed This Visit      Cardiovascular and Mediastinum   Essential hypertension - Primary     Musculoskeletal and Integument   Osteoporosis   Relevant Medications   alendronate (FOSAMAX) 70 MG tablet        Dr. Mileah Hemmer [facility]  11/08/2015

## 2015-12-19 DIAGNOSIS — E119 Type 2 diabetes mellitus without complications: Secondary | ICD-10-CM | POA: Diagnosis not present

## 2016-01-10 ENCOUNTER — Encounter: Payer: Self-pay | Admitting: Family Medicine

## 2016-01-10 ENCOUNTER — Ambulatory Visit (INDEPENDENT_AMBULATORY_CARE_PROVIDER_SITE_OTHER): Payer: Medicare Other | Admitting: Family Medicine

## 2016-01-10 VITALS — BP 130/80 | HR 80 | Ht 59.0 in | Wt 107.0 lb

## 2016-01-10 DIAGNOSIS — M5136 Other intervertebral disc degeneration, lumbar region: Secondary | ICD-10-CM | POA: Diagnosis not present

## 2016-01-10 DIAGNOSIS — E039 Hypothyroidism, unspecified: Secondary | ICD-10-CM | POA: Diagnosis not present

## 2016-01-10 MED ORDER — LEVOTHYROXINE SODIUM 50 MCG PO TABS: 50.0000 ug | ORAL_TABLET | Freq: Every day | ORAL | Status: DC

## 2016-01-10 NOTE — Progress Notes (Signed)
Name: Lorraine Fischer   MRN: [id number]    DOB: 1922-10-04   Date:01/10/2016       Progress Note  Subjective  Chief Complaint  Chief Complaint  Patient presents with  . Arthritis    L) leg pain- gets better after moving around. Went to chiropractor x 2- helped at first, then started hurting again    Arthritis Presents for follow-up visit. She reports no pain, stiffness, joint swelling or joint warmth. Her pain is at a severity of 4/10. Associated symptoms include pain at night and pain while resting. Pertinent negatives include no diarrhea, dry eyes, dysuria, fatigue, fever, rash or weight loss. Her past medical history is significant for chronic back pain. Past treatments include NSAIDs. The treatment provided mild relief. Compliance with prior treatments has been good.  Thyroid Problem Presents for follow-up visit. Patient reports no anxiety, constipation, diarrhea, fatigue, palpitations or weight loss. The symptoms have been stable. Past treatments include levothyroxine. The treatment provided mild relief.    No problem-specific assessment & plan notes found for this encounter.   Past Medical History  Diagnosis Date  . Hypertension   . Diabetes mellitus without complication ([place])   . Hyperlipidemia   . Thyroid disease     History reviewed. No pertinent past surgical history.  History reviewed. No pertinent family history.  Social History   Social History  . Marital Status: Widowed    Spouse Name: N/A  . Number of Children: N/A  . Years of Education: N/A   Occupational History  . Not on file.   Social History Main Topics  . Smoking status: Never Smoker   . Smokeless tobacco: Never Used  . Alcohol Use: No  . Drug Use: No  . Sexual Activity: Not Currently   Other Topics Concern  . Not on file   Social History Narrative    Allergies  Allergen Reactions  . Sulfa Antibiotics Shortness Of Breath     Review of Systems  Constitutional: Negative for fever,  chills, weight loss, malaise/fatigue and fatigue.  HENT: Negative for ear discharge, ear pain and sore throat.   Eyes: Negative for blurred vision.  Respiratory: Negative for cough, sputum production, shortness of breath and wheezing.   Cardiovascular: Negative for chest pain, palpitations and leg swelling.  Gastrointestinal: Negative for heartburn, nausea, abdominal pain, diarrhea, constipation, blood in stool and melena.  Genitourinary: Negative for dysuria, urgency, frequency and hematuria.  Musculoskeletal: Positive for arthritis. Negative for myalgias, back pain, joint pain, joint swelling, stiffness and neck pain.  Skin: Negative for rash.  Neurological: Negative for dizziness, tingling, sensory change, focal weakness and headaches.  Endo/Heme/Allergies: Negative for environmental allergies and polydipsia. Does not bruise/bleed easily.  Psychiatric/Behavioral: Negative for depression and suicidal ideas. The patient is not nervous/anxious and does not have insomnia.      Objective  Filed Vitals:   01/10/16 1041  BP: 130/80  Pulse: 80  Height: 4\' 11"  (1.499 m)  Weight: 107 lb (48.535 kg)    Physical Exam  Constitutional: She is well-developed, well-nourished, and in no distress. No distress.  HENT:  Head: Normocephalic and atraumatic.  Right Ear: External ear normal.  Left Ear: External ear normal.  Nose: Nose normal.  Mouth/Throat: Oropharynx is clear and moist.  Eyes: Conjunctivae and EOM are normal. Pupils are equal, round, and reactive to light. Right eye exhibits no discharge. Left eye exhibits no discharge.  Neck: Normal range of motion. Neck supple. No JVD present. No thyromegaly present.  Cardiovascular:  Normal rate, regular rhythm, normal heart sounds and intact distal pulses.  Exam reveals no gallop and no friction rub.   No murmur heard. Pulmonary/Chest: Effort normal and breath sounds normal.  Abdominal: Soft. Bowel sounds are normal. She exhibits no mass. There  is no tenderness. There is no guarding.  Musculoskeletal: Normal range of motion. She exhibits no edema.  Lymphadenopathy:    She has no cervical adenopathy.  Neurological: She is alert. She has normal reflexes.  Skin: Skin is warm and dry. She is not diaphoretic.  Psychiatric: Mood and affect normal.  Nursing note and vitals reviewed.     Assessment & Plan  Problem List Items Addressed This Visit    None    Visit Diagnoses    Degenerative disc disease, lumbar    -  Primary    Hypothyroidism, unspecified hypothyroidism type        Relevant Medications    levothyroxine (SYNTHROID, LEVOTHROID) 50 MCG tablet         Dr. [facility]  01/10/2016

## 2016-03-08 ENCOUNTER — Ambulatory Visit: Payer: Medicare Other | Admitting: Family Medicine

## 2016-03-27 DIAGNOSIS — L570 Actinic keratosis: Secondary | ICD-10-CM | POA: Diagnosis not present

## 2016-03-27 DIAGNOSIS — L728 Other follicular cysts of the skin and subcutaneous tissue: Secondary | ICD-10-CM | POA: Diagnosis not present

## 2016-03-27 DIAGNOSIS — D485 Neoplasm of uncertain behavior of skin: Secondary | ICD-10-CM | POA: Diagnosis not present

## 2016-04-20 DIAGNOSIS — E119 Type 2 diabetes mellitus without complications: Secondary | ICD-10-CM | POA: Diagnosis not present

## 2016-05-01 ENCOUNTER — Encounter: Payer: Self-pay | Admitting: Family Medicine

## 2016-05-01 ENCOUNTER — Ambulatory Visit (INDEPENDENT_AMBULATORY_CARE_PROVIDER_SITE_OTHER): Payer: Medicare Other | Admitting: Family Medicine

## 2016-05-01 VITALS — BP 100/70 | HR 80 | Ht 59.0 in | Wt 105.0 lb

## 2016-05-01 DIAGNOSIS — N3000 Acute cystitis without hematuria: Secondary | ICD-10-CM | POA: Diagnosis not present

## 2016-05-01 LAB — POCT URINALYSIS DIPSTICK
Bilirubin, UA: NEGATIVE
GLUCOSE UA: NEGATIVE
Ketones, UA: NEGATIVE
NITRITE UA: POSITIVE
PH UA: 6
PROTEIN UA: NEGATIVE
RBC UA: NEGATIVE
SPEC GRAV UA: 1.015
UROBILINOGEN UA: 0.2

## 2016-05-01 MED ORDER — CIPROFLOXACIN HCL 250 MG PO TABS: 250.0000 mg | ORAL_TABLET | Freq: Two times a day (BID) | ORAL | 1 refills | Status: DC

## 2016-05-01 NOTE — Progress Notes (Signed)
Name: Lorraine Fischer   MRN: [id number]    DOB: 21-Apr-1923   Date:05/01/2016       Progress Note  Subjective  Chief Complaint  Chief Complaint  Patient presents with  . Urinary Tract Infection    Urinary Tract Infection   This is a new problem. The current episode started in the past 7 days. The problem occurs every urination. The problem has been unchanged. The quality of the pain is described as burning. The pain is at a severity of 2/10. The pain is mild. There has been no fever. The fever has been present for 3 - 4 days. Associated symptoms include frequency and urgency. Pertinent negatives include no chills, discharge, flank pain, hematuria, hesitancy, nausea or sweats. She has tried increased fluids (cranberry ) for the symptoms. The treatment provided mild relief.    No problem-specific Assessment & Plan notes found for this encounter.   Past Medical History:  Diagnosis Date  . Diabetes mellitus without complication ([place])   . Hyperlipidemia   . Hypertension   . Thyroid disease     History reviewed. No pertinent surgical history.  History reviewed. No pertinent family history.  Social History   Social History  . Marital status: Widowed    Spouse name: N/A  . Number of children: N/A  . Years of education: N/A   Occupational History  . Not on file.   Social History Main Topics  . Smoking status: Never Smoker  . Smokeless tobacco: Never Used  . Alcohol use No  . Drug use: No  . Sexual activity: Not Currently   Other Topics Concern  . Not on file   Social History Narrative  . No narrative on file    Allergies  Allergen Reactions  . Sulfa Antibiotics Shortness Of Breath     Review of Systems  Constitutional: Negative for chills, fever, malaise/fatigue and weight loss.  HENT: Negative for ear discharge, ear pain and sore throat.   Eyes: Negative for blurred vision.  Respiratory: Negative for cough, sputum production, shortness of breath and wheezing.    Cardiovascular: Negative for chest pain, palpitations and leg swelling.  Gastrointestinal: Negative for abdominal pain, blood in stool, constipation, diarrhea, heartburn, melena and nausea.  Genitourinary: Positive for frequency and urgency. Negative for dysuria, flank pain, hematuria and hesitancy.  Musculoskeletal: Negative for back pain, joint pain, myalgias and neck pain.  Skin: Negative for rash.  Neurological: Negative for dizziness, tingling, sensory change, focal weakness and headaches.  Endo/Heme/Allergies: Negative for environmental allergies and polydipsia. Does not bruise/bleed easily.  Psychiatric/Behavioral: Negative for depression and suicidal ideas. The patient is not nervous/anxious and does not have insomnia.      Objective  Vitals:   05/01/16 1556  BP: 100/70  Pulse: 80  Weight: 105 lb (47.6 kg)  Height: 4\' 11"  (1.499 m)    Physical Exam  Constitutional: She is well-developed, well-nourished, and in no distress. No distress.  HENT:  Head: Normocephalic and atraumatic.  Right Ear: External ear normal.  Left Ear: External ear normal.  Nose: Nose normal.  Mouth/Throat: Oropharynx is clear and moist.  Eyes: Conjunctivae and EOM are normal. Pupils are equal, round, and reactive to light. Right eye exhibits no discharge. Left eye exhibits no discharge.  Neck: Normal range of motion. Neck supple. No JVD present. No thyromegaly present.  Cardiovascular: Normal rate, regular rhythm, normal heart sounds and intact distal pulses.  Exam reveals no gallop and no friction rub.   No murmur heard.  Pulmonary/Chest: Effort normal and breath sounds normal. She has no wheezes. She has no rales.  Abdominal: Soft. Bowel sounds are normal. She exhibits no mass. There is no tenderness. There is no guarding.  Musculoskeletal: Normal range of motion. She exhibits no edema.  Lymphadenopathy:    She has no cervical adenopathy.  Neurological: She is alert. She has normal reflexes.   Skin: Skin is warm and dry. She is not diaphoretic.  Psychiatric: Mood and affect normal.  Nursing note and vitals reviewed.     Assessment & Plan  Problem List Items Addressed This Visit    None    Visit Diagnoses    Acute cystitis without hematuria    -  Primary   Relevant Medications   ciprofloxacin (CIPRO) 250 MG tablet   Other Relevant Orders   POCT urinalysis dipstick (Completed)        Dr. [facility]  05/01/16

## 2016-06-04 ENCOUNTER — Ambulatory Visit (INDEPENDENT_AMBULATORY_CARE_PROVIDER_SITE_OTHER): Payer: Medicare Other | Admitting: Family Medicine

## 2016-06-04 ENCOUNTER — Encounter: Payer: Self-pay | Admitting: Family Medicine

## 2016-06-04 VITALS — BP 130/70 | HR 84 | Temp 98.1°F | Ht 59.0 in | Wt 105.0 lb

## 2016-06-04 DIAGNOSIS — R1032 Left lower quadrant pain: Secondary | ICD-10-CM | POA: Diagnosis not present

## 2016-06-04 LAB — POCT URINALYSIS DIPSTICK
Bilirubin, UA: NEGATIVE
Blood, UA: NEGATIVE
GLUCOSE UA: NEGATIVE
KETONES UA: NEGATIVE
Leukocytes, UA: NEGATIVE
Nitrite, UA: POSITIVE
Protein, UA: NEGATIVE
SPEC GRAV UA: 1.02
Urobilinogen, UA: 0.2
pH, UA: 5

## 2016-06-04 MED ORDER — METRONIDAZOLE 500 MG PO TABS: 500.0000 mg | ORAL_TABLET | Freq: Two times a day (BID) | ORAL | 0 refills | Status: DC

## 2016-06-04 MED ORDER — AMOXICILLIN-POT CLAVULANATE 875-125 MG PO TABS: 1.0000 | ORAL_TABLET | Freq: Two times a day (BID) | ORAL | 0 refills | Status: DC

## 2016-06-04 NOTE — Progress Notes (Signed)
Name: Lorraine Fischer   MRN: [id number]    DOB: April 10, 1923   Date:06/04/2016       Progress Note  Subjective  Chief Complaint  Chief Complaint  Patient presents with  . Urinary Tract Infection    frequent urination, odor to it, and some confusion x 1 week    Urinary Tract Infection   This is a recurrent problem. The current episode started 1 to 4 weeks ago. The problem has been waxing and waning. The quality of the pain is described as aching (suprapubic). Associated symptoms include flank pain, frequency, nausea and urgency. Pertinent negatives include no chills, hematuria or sweats. She has tried antibiotics for the symptoms. The treatment provided mild relief.  Abdominal Pain  This is a chronic problem. The current episode started in the past 7 days. The onset quality is sudden. The problem occurs daily. The problem has been waxing and waning. The pain is located in the suprapubic region and LLQ. The quality of the pain is aching. The abdominal pain radiates to the back. Associated symptoms include frequency and nausea. Pertinent negatives include no constipation, diarrhea, dysuria, fever, headaches, hematochezia, hematuria, melena, myalgias or weight loss. Nothing aggravates the pain. The treatment provided mild relief.    No problem-specific Assessment & Plan notes found for this encounter.   Past Medical History:  Diagnosis Date  . Diabetes mellitus without complication ([place])   . Hyperlipidemia   . Hypertension   . Thyroid disease     History reviewed. No pertinent surgical history.  History reviewed. No pertinent family history.  Social History   Social History  . Marital status: Widowed    Spouse name: N/A  . Number of children: N/A  . Years of education: N/A   Occupational History  . Not on file.   Social History Main Topics  . Smoking status: Never Smoker  . Smokeless tobacco: Never Used  . Alcohol use No  . Drug use: No  . Sexual activity: Not Currently    Other Topics Concern  . Not on file   Social History Narrative  . No narrative on file    Allergies  Allergen Reactions  . Sulfa Antibiotics Shortness Of Breath     Review of Systems  Constitutional: Negative for chills, fever, malaise/fatigue and weight loss.  HENT: Negative for ear discharge, ear pain and sore throat.   Eyes: Negative for blurred vision.  Respiratory: Negative for cough, sputum production, shortness of breath and wheezing.   Cardiovascular: Negative for chest pain, palpitations and leg swelling.  Gastrointestinal: Positive for abdominal pain and nausea. Negative for blood in stool, constipation, diarrhea, heartburn, hematochezia and melena.  Genitourinary: Positive for flank pain, frequency and urgency. Negative for dysuria and hematuria.  Musculoskeletal: Negative for back pain, joint pain, myalgias and neck pain.  Skin: Negative for rash.  Neurological: Negative for dizziness, tingling, sensory change, focal weakness and headaches.  Endo/Heme/Allergies: Negative for environmental allergies and polydipsia. Does not bruise/bleed easily.  Psychiatric/Behavioral: Negative for depression and suicidal ideas. The patient is not nervous/anxious and does not have insomnia.      Objective  Vitals:   06/04/16 0956  BP: 130/70  Pulse: 84  Temp: 98.1 F (36.7 C)  TempSrc: Oral  Weight: 105 lb (47.6 kg)  Height: 4\' 11"  (1.499 m)    Physical Exam  Constitutional: She is well-developed, well-nourished, and in no distress. No distress.  HENT:  Head: Normocephalic and atraumatic.  Right Ear: External ear normal.  Left  Ear: External ear normal.  Nose: Nose normal.  Mouth/Throat: Oropharynx is clear and moist.  Eyes: Conjunctivae and EOM are normal. Pupils are equal, round, and reactive to light. Right eye exhibits no discharge. Left eye exhibits no discharge.  Neck: Normal range of motion. Neck supple. No JVD present. No thyromegaly present.  Cardiovascular:  Normal rate, regular rhythm, normal heart sounds and intact distal pulses.  Exam reveals no gallop and no friction rub.   No murmur heard. Pulmonary/Chest: Effort normal and breath sounds normal.  Abdominal: Soft. Bowel sounds are normal. She exhibits no mass. There is no tenderness. There is no guarding.  Musculoskeletal: Normal range of motion. She exhibits no edema.  Lymphadenopathy:    She has no cervical adenopathy.  Neurological: She is alert. She has normal reflexes.  Skin: Skin is warm and dry. She is not diaphoretic.  Psychiatric: Mood and affect normal.  Nursing note and vitals reviewed.     Assessment & Plan  Problem List Items Addressed This Visit    None    Visit Diagnoses    Left lower quadrant pain    -  Primary   Relevant Medications   amoxicillin-clavulanate (AUGMENTIN) 875-125 MG tablet   metroNIDAZOLE (FLAGYL) 500 MG tablet   Other Relevant Orders   POCT urinalysis dipstick (Completed)   CBC w/Diff/Platelet   Urine Culture     I spent 25 minutes with this patient, More than 50% of that time was spent in face to face education, counseling and care coordination.   Dr. [facility]  06/04/16

## 2016-06-05 LAB — CBC WITH DIFFERENTIAL/PLATELET
BASOS ABS: 0 10*3/uL (ref 0.0–0.2)
Basos: 1 %
EOS (ABSOLUTE): 0.3 10*3/uL (ref 0.0–0.4)
Eos: 5 %
Hematocrit: 38.2 % (ref 34.0–46.6)
Hemoglobin: 12.6 g/dL (ref 11.1–15.9)
IMMATURE GRANS (ABS): 0 10*3/uL (ref 0.0–0.1)
Immature Granulocytes: 0 %
LYMPHS ABS: 1.9 10*3/uL (ref 0.7–3.1)
LYMPHS: 30 %
MCH: 31.3 pg (ref 26.6–33.0)
MCHC: 33 g/dL (ref 31.5–35.7)
MCV: 95 fL (ref 79–97)
MONOS ABS: 0.5 10*3/uL (ref 0.1–0.9)
Monocytes: 7 %
NEUTROS ABS: 3.7 10*3/uL (ref 1.4–7.0)
Neutrophils: 57 %
PLATELETS: 241 10*3/uL (ref 150–379)
RBC: 4.02 x10E6/uL (ref 3.77–5.28)
RDW: 14.3 % (ref 12.3–15.4)
WBC: 6.5 10*3/uL (ref 3.4–10.8)

## 2016-06-06 LAB — URINE CULTURE

## 2016-07-17 ENCOUNTER — Ambulatory Visit (INDEPENDENT_AMBULATORY_CARE_PROVIDER_SITE_OTHER): Payer: Medicare Other

## 2016-07-17 ENCOUNTER — Ambulatory Visit (INDEPENDENT_AMBULATORY_CARE_PROVIDER_SITE_OTHER)
Admission: EM | Admit: 2016-07-17 | Discharge: 2016-07-17 | Disposition: A | Discharge status: Home / Self Care | Payer: Medicare Other | Source: Home / Self Care | Attending: Emergency Medicine | Admitting: Emergency Medicine

## 2016-07-17 ENCOUNTER — Emergency Department
Admission: EM | Admit: 2016-07-17 | Discharge: 2016-07-17 | Disposition: A | Discharge status: Home / Self Care | Payer: Medicare Other | Attending: Emergency Medicine | Admitting: Emergency Medicine

## 2016-07-17 DIAGNOSIS — S32592A Other specified fracture of left pubis, initial encounter for closed fracture: Secondary | ICD-10-CM | POA: Diagnosis not present

## 2016-07-17 DIAGNOSIS — R319 Hematuria, unspecified: Secondary | ICD-10-CM | POA: Diagnosis not present

## 2016-07-17 DIAGNOSIS — R531 Weakness: Secondary | ICD-10-CM

## 2016-07-17 DIAGNOSIS — M25552 Pain in left hip: Secondary | ICD-10-CM | POA: Diagnosis not present

## 2016-07-17 DIAGNOSIS — I499 Cardiac arrhythmia, unspecified: Secondary | ICD-10-CM | POA: Diagnosis not present

## 2016-07-17 DIAGNOSIS — F329 Major depressive disorder, single episode, unspecified: Secondary | ICD-10-CM | POA: Insufficient documentation

## 2016-07-17 DIAGNOSIS — I493 Ventricular premature depolarization: Secondary | ICD-10-CM

## 2016-07-17 DIAGNOSIS — E119 Type 2 diabetes mellitus without complications: Secondary | ICD-10-CM | POA: Diagnosis not present

## 2016-07-17 DIAGNOSIS — R52 Pain, unspecified: Secondary | ICD-10-CM | POA: Diagnosis present

## 2016-07-17 DIAGNOSIS — I10 Essential (primary) hypertension: Secondary | ICD-10-CM | POA: Insufficient documentation

## 2016-07-17 DIAGNOSIS — Z79899 Other long term (current) drug therapy: Secondary | ICD-10-CM | POA: Insufficient documentation

## 2016-07-17 DIAGNOSIS — Z7984 Long term (current) use of oral hypoglycemic drugs: Secondary | ICD-10-CM | POA: Insufficient documentation

## 2016-07-17 DIAGNOSIS — M25559 Pain in unspecified hip: Secondary | ICD-10-CM

## 2016-07-17 DIAGNOSIS — Z0001 Encounter for general adult medical examination with abnormal findings: Secondary | ICD-10-CM

## 2016-07-17 DIAGNOSIS — N390 Urinary tract infection, site not specified: Secondary | ICD-10-CM

## 2016-07-17 LAB — URINALYSIS COMPLETE WITH MICROSCOPIC (ARMC ONLY)
BACTERIA UA: NONE SEEN
Bilirubin Urine: NEGATIVE
Bilirubin Urine: NEGATIVE
GLUCOSE, UA: NEGATIVE mg/dL
Glucose, UA: NEGATIVE mg/dL
Ketones, ur: 15 mg/dL — AB
LEUKOCYTES UA: NEGATIVE
NITRITE: NEGATIVE
NITRITE: NEGATIVE
PH: 6 (ref 5.0–8.0)
PH: 7 (ref 5.0–8.0)
PROTEIN: NEGATIVE mg/dL
SPECIFIC GRAVITY, URINE: 1.008 (ref 1.005–1.030)
Specific Gravity, Urine: 1.02 (ref 1.005–1.030)
Squamous Epithelial / LPF: NONE SEEN

## 2016-07-17 LAB — CBC WITH DIFFERENTIAL/PLATELET
BASOS ABS: 0 10*3/uL (ref 0–0.1)
BASOS PCT: 0 %
EOS ABS: 0.5 10*3/uL (ref 0–0.7)
Eosinophils Relative: 5 %
HEMATOCRIT: 41.6 % (ref 35.0–47.0)
HEMOGLOBIN: 14.1 g/dL (ref 12.0–16.0)
Lymphocytes Relative: 27 %
Lymphs Abs: 2.6 10*3/uL (ref 1.0–3.6)
MCH: 32.9 pg (ref 26.0–34.0)
MCHC: 33.9 g/dL (ref 32.0–36.0)
MCV: 97.1 fL (ref 80.0–100.0)
MONO ABS: 0.7 10*3/uL (ref 0.2–0.9)
Monocytes Relative: 8 %
NEUTROS ABS: 5.6 10*3/uL (ref 1.4–6.5)
NEUTROS PCT: 60 %
Platelets: 241 10*3/uL (ref 150–440)
RBC: 4.28 MIL/uL (ref 3.80–5.20)
RDW: 14 % (ref 11.5–14.5)
WBC: 9.4 10*3/uL (ref 3.6–11.0)

## 2016-07-17 LAB — COMPREHENSIVE METABOLIC PANEL
ALBUMIN: 3.9 g/dL (ref 3.5–5.0)
ALK PHOS: 59 U/L (ref 38–126)
ALT: 16 U/L (ref 14–54)
ANION GAP: 10 (ref 5–15)
AST: 25 U/L (ref 15–41)
BUN: 19 mg/dL (ref 6–20)
CALCIUM: 10.2 mg/dL (ref 8.9–10.3)
CHLORIDE: 105 mmol/L (ref 101–111)
CO2: 24 mmol/L (ref 22–32)
Creatinine, Ser: 0.79 mg/dL (ref 0.44–1.00)
GFR calc non Af Amer: >60 mL/min (ref >60)
GLUCOSE: 96 mg/dL (ref 65–99)
Potassium: 4.1 mmol/L (ref 3.5–5.1)
SODIUM: 139 mmol/L (ref 135–145)
Total Bilirubin: 0.4 mg/dL (ref 0.3–1.2)
Total Protein: 6.9 g/dL (ref 6.5–8.1)

## 2016-07-17 LAB — TROPONIN I: TROPONIN I: 0.05 ng/mL — AB (ref <0.03)

## 2016-07-17 LAB — TSH: TSH: 1.936 u[IU]/mL (ref 0.350–4.500)

## 2016-07-17 NOTE — ED Notes (Signed)
Pt  Given graham crackers and peanut butter with Ginger Ale after MD verbalized pt could eat. Family updated to the best of this RNs ability. MD verbalzied to family member he would update family as soon as possible.

## 2016-07-17 NOTE — ED Provider Notes (Signed)
[facility] Emergency Department Provider Note  ____________________________________________   I have reviewed the triage vital signs and the nursing notes.   HISTORY  Chief Complaint Irregular Heart Beat   History limited by: Not Limited   HPI Lorraine Fischer is a 82 y.o. female who presents to the emergency department today from urgent care because of concerns for irregular heartbeat. Patient was at urgent care because of diffuse pain. This been going on for quite some time. While at urgent care she was found to have had a subacute pubic ramus fracture. Family states this is from a fall roughly 3 weeks ago. She has been able to walk since then but has been complaining of some pain. In addition she was found to have a urinary tract infection. Furthermore she was found to have an EKG with frequent PVCs. Patient denies having similar rhythm in the past. Patient was sent here for further eval.    Past Medical History:  Diagnosis Date  . Diabetes mellitus without complication ([place])   . Hyperlipidemia   . Hypertension   . Thyroid disease     Patient Active Problem List   Diagnosis Date Noted  . Osteoporosis 11/08/2015  . Essential hypertension 11/08/2015    No past surgical history on file.  Prior to Admission medications   Medication Sig Start Date End Date Taking? Authorizing Provider  Cranberry 200 MG CAPS Take 1 capsule by mouth daily.    Historical Provider, MD  levothyroxine (SYNTHROID, LEVOTHROID) 50 MCG tablet Take 1 tablet (50 mcg total) by mouth daily before breakfast. 01/10/16   Juline Patch, MD  lisinopril (PRINIVIL,ZESTRIL) 40 MG tablet Take 1 tablet (40 mg total) by mouth daily. 10/11/15   Juline Patch, MD  lovastatin (MEVACOR) 40 MG tablet Take 1 tablet (40 mg total) by mouth daily. 10/11/15   Juline Patch, MD  metFORMIN (GLUCOPHAGE) 500 MG tablet Take 1 tablet (500 mg total) by mouth daily with breakfast. 10/11/15   Juline Patch, MD     Allergies Sulfa antibiotics  No family history on file.  Social History Social History  Substance Use Topics  . Smoking status: Never Smoker  . Smokeless tobacco: Never Used  . Alcohol use No    Review of Systems  Constitutional: Negative for fever. Diffuse pain. Diffuse weakness.  Cardiovascular: Negative for chest pain. Respiratory: Negative for shortness of breath. Gastrointestinal: Negative for abdominal pain, vomiting and diarrhea. Genitourinary: Negative for dysuria. Musculoskeletal: Negative for back pain. Skin: Negative for rash. Neurological: Negative for headaches, focal weakness or numbness.   10-point ROS otherwise negative.  ____________________________________________   PHYSICAL EXAM:  VITAL SIGNS: ED Triage Vitals  Enc Vitals Group     BP 186/96     Pulse 78     Resp 23     Temp 98.3     Temp src      SpO2 97   Constitutional: Alert and oriented. Well appearing and in no distress. Eyes: Conjunctivae are normal. Normal extraocular movements. ENT   Head: Normocephalic and atraumatic.   Nose: No congestion/rhinnorhea.   Mouth/Throat: Mucous membranes are moist.   Neck: No stridor. Hematological/Lymphatic/Immunilogical: No cervical lymphadenopathy. Cardiovascular: Normal rate, irregular rhythm.  Systolic murmur.  Respiratory: Normal respiratory effort without tachypnea nor retractions. Breath sounds are clear and equal bilaterally. No wheezes/rales/rhonchi. Gastrointestinal: Soft and nontender. No distention.  Genitourinary: Deferred Musculoskeletal: Normal range of motion in all extremities. No lower extremity edema. Neurologic:  Normal speech and  language. No gross focal neurologic deficits are appreciated.  Skin:  Skin is warm, dry and intact. No rash noted. Psychiatric: Mood and affect are normal. Speech and behavior are normal. Patient exhibits appropriate insight and judgment.  ____________________________________________     LABS (pertinent positives/negatives)  Labs Reviewed  TROPONIN I - Abnormal; Notable for the following:       Result Value   Troponin I 0.05 (*)    All other components within normal limits  URINALYSIS COMPLETEWITH MICROSCOPIC ([facility] ONLY) - Abnormal; Notable for the following:    Color, Urine YELLOW (*)    APPearance CLEAR (*)    Ketones, ur TRACE (*)    Hgb urine dipstick 1+ (*)    Squamous Epithelial / LPF 0-5 (*)    All other components within normal limits  URINE CULTURE  CBC WITH DIFFERENTIAL/PLATELET  COMPREHENSIVE METABOLIC PANEL  TSH     ____________________________________________   EKG  I, Nance Pear, attending physician, personally viewed and interpreted this EKG  EKG Time: 1907 Rate: 76 Rhythm: sinus rhythm with PVCs Axis: normal Intervals: qtc 458 QRS: narrow ST changes: no st elevation Impression: abnormal ekg   ____________________________________________    RADIOLOGY  None  ____________________________________________   PROCEDURES  Procedures  ____________________________________________   INITIAL IMPRESSION / ASSESSMENT AND PLAN / ED COURSE  Pertinent labs & imaging results that were available during my care of the patient were reviewed by me and considered in my medical decision making (see chart for details).  Patient presented from urgent care because of concerns for irregular rhythm on EKG and some weakness. EKG her does so frequent PVCs in the pattern of ventricular trigeminy. Will plan on checking blood work and recheck in urine.  Clinical Course    Urine does not show any obvious signs of urinary tract infection here. We will however sent for culture. Patient's troponin was minimally elevated. At this point I had a discussion with patient and family about also care. This point patient would not have any interest in a catheterization family is on board without plan. I did discuss the mild elevation of troponin could  represent a small heart attack. Patient and family felt comfortable with her going home knowing this. Again patient would not be interesting catheterization. During my discussion I was watching the monitor and did notice the patient was back in normal sinus rhythm with only infrequent PVCs. Will discharge to follow up with primary care. ____________________________________________   FINAL CLINICAL IMPRESSION(S) / ED DIAGNOSES  Final diagnoses:  Weakness     Note: This dictation was prepared with Dragon dictation. Any transcriptional errors that result from this process are unintentional    Nance Pear, MD 07/17/16 2148

## 2016-07-17 NOTE — ED Triage Notes (Signed)
Per EMS, pt was at [facility] with hip pain and weakness. Upon doing an EKG at [facility] pt had an irregular heart rhythm: trigeminy. Pt denies any SOB or CP. Pt also states that they did an xray at [facility] which showed an old hip fracture and pt states she has had trouble ambulating since. Pt also states she has a UTI. Per EMS pt's BP was 192/99.

## 2016-07-17 NOTE — ED Provider Notes (Signed)
HPI  SUBJECTIVE:  Lorraine Fischer is a 82 y.o. female who presents with diffuse body aches "for a long time". Son states that the patient has deteriorated in functionality over the past week and is having difficulty with coordination and gait. Patient states that she is worried about her blood pressure and states that she feels "swimmy headed" which is gotten worse at the past few days. She does not measure her blood pressure at home. States that she has not been on blood pressure medicines for some time. She had a fall 3-4 weeks ago and reports some pelvic pain, although she states that this is been present for some time as well. She has been taking Tylenol and Aleve for this with improvement in her symptoms. No aggravating factors. She denies fevers, chest pain, short of breath, coughing, wheezing. No sinus congestion, and sinus pain or pressure, abdominal pain, dysuria, urgency, frequency, cloudy or odorous urine, hematuria. No change in mental status. No arm, leg, facial weakness, dysarthria. She has past medical history of hypertension, osteoporosis, left hip fracture status post ORIF, UTI, MI, kidney disease, stroke/TIA, status post CEA. [id number] Ronnald Ramp, MD     Past Medical History:  Diagnosis Date  . Diabetes mellitus without complication ([place])   . Hyperlipidemia   . Hypertension   . Thyroid disease     History reviewed. No pertinent surgical history.  History reviewed. No pertinent family history.  Social History  Substance Use Topics  . Smoking status: Never Smoker  . Smokeless tobacco: Never Used  . Alcohol use No    No current facility-administered medications for this encounter.   Current Outpatient Prescriptions:  .  Cranberry 200 MG CAPS, Take 1 capsule by mouth daily., Disp: , Rfl:  .  levothyroxine (SYNTHROID, LEVOTHROID) 50 MCG tablet, Take 1 tablet (50 mcg total) by mouth daily before breakfast., Disp: 90 tablet, Rfl: 1 .  lisinopril (PRINIVIL,ZESTRIL) 40 MG  tablet, Take 1 tablet (40 mg total) by mouth daily., Disp: 90 tablet, Rfl: 1 .  lovastatin (MEVACOR) 40 MG tablet, Take 1 tablet (40 mg total) by mouth daily., Disp: 90 tablet, Rfl: 1 .  metFORMIN (GLUCOPHAGE) 500 MG tablet, Take 1 tablet (500 mg total) by mouth daily with breakfast., Disp: 90 tablet, Rfl: 1  Allergies  Allergen Reactions  . Sulfa Antibiotics Shortness Of Breath     ROS  As noted in HPI.   Physical Exam  BP (!) 182/92 (BP Location: Left Arm)   Pulse 85   Temp 98.1 F (36.7 C) (Oral)   Resp 18   Ht 4\' 11"  (1.499 m)   Wt 108 lb (49 kg)   SpO2 99%   BMI 21.81 kg/m    BP Readings from Last 3 Encounters:  07/17/16 (!) 182/92  06/04/16 130/70  05/01/16 100/70   Constitutional: Well developed, well nourished, no acute distress Eyes:  EOMI, conjunctiva normal bilaterally HENT: Normocephalic, atraumatic,mucus membranes moist Respiratory: Normal inspiratory effort Good air movement, lungs clear bilaterally  Cardiovascular: Irregularly irregular, no murmurs, rubs, gallops GI: nondistended soft, nontender, active bowel sounds no suprapubic tenderness.  Back: No CVA tenderness skin: No rash, skin intact Musculoskeletal: no deformities . Patient moving all extremities equally. No pain with internal/external rotation, abduction/adduction of her hips bilaterally SLR negative. Neurologic: Alert & oriented x 3, no focal neuro deficits Psychiatric: Speech and behavior appropriate   ED Course   Medications - No data to display  Orders Placed This Encounter  Procedures  . DG Hip  Unilat With Pelvis 2-3 Views Left    Standing Status:   Standing    Number of Occurrences:   1    Order Specific Question:   Symptom/Reason for Exam    Answer:   Hip pain [id number]  . Urinalysis complete, with microscopic    Standing Status:   Standing    Number of Occurrences:   1  . ED EKG    Standing Status:   Standing    Number of Occurrences:   1    Order Specific Question:    Reason for Exam    Answer:   Weakness  . EKG 12-Lead    Standing Status:   Standing    Number of Occurrences:   1  . EKG 12-Lead    Standing Status:   Standing    Number of Occurrences:   1  . ED EKG    Tachycardia    Standing Status:   Standing    Number of Occurrences:   1    Order Specific Question:   Reason for Exam    Answer:   Other (See Comments)    Results for orders placed or performed during the hospital encounter of 07/17/16 (from the past 24 hour(s))  Urinalysis complete, with microscopic     Status: Abnormal   Collection Time: 07/17/16  5:41 PM  Result Value Ref Range   Color, Urine YELLOW YELLOW   APPearance HAZY (A) CLEAR   Glucose, UA NEGATIVE NEGATIVE mg/dL   Bilirubin Urine NEGATIVE NEGATIVE   Ketones, ur 15 (A) NEGATIVE mg/dL   Specific Gravity, Urine 1.020 1.005 - 1.030   Hgb urine dipstick TRACE (A) NEGATIVE   pH 6.0 5.0 - 8.0   Protein, ur TRACE (A) NEGATIVE mg/dL   Nitrite NEGATIVE NEGATIVE   Leukocytes, UA TRACE (A) NEGATIVE   RBC / HPF 0-5 0 - 5 RBC/hpf   WBC, UA 6-30 0 - 5 WBC/hpf   Bacteria, UA MANY (A) NONE SEEN   Squamous Epithelial / LPF NONE SEEN NONE SEEN   Dg Hip Unilat With Pelvis 2-3 Views Left  Result Date: 07/17/2016 CLINICAL DATA:  Fall.  Pain. EXAM: DG HIP (WITH OR WITHOUT PELVIS) 2-3V LEFT COMPARISON:  01/18/2013. FINDINGS: Degenerative changes lumbar spine and both hips. Lumbar scoliosis. Prior ORIF left hip. Left inferior pubic ramus fracture noted. This may be old. Callus formation appears to be present. No other focal abnormality. Aortoiliac atherosclerotic-disease . IMPRESSION: 1. Left inferior pubic ramus fracture. This may be old. Some degree of callus formation appears to be present. 2. ORIF left hip. 3. Degenerative changes lumbar spine and both hips. Diffuse osteopenia. Scoliosis lumbar spine. 4. Aortoiliac atherosclerotic vascular disease. Electronically Signed   By: Marcello Moores  Register   On: 07/17/2016 16:52    ED Clinical  Impression  Weakness  Hip pain - Plan: DG Hip Unilat With Pelvis 2-3 Views Left, DG Hip Unilat With Pelvis 2-3 Views Left, CANCELED: DG Arthro Hip Left, CANCELED: DG Arthro Hip Left, CANCELED: DG HIP UNILAT WITH PELVIS 1V LEFT, CANCELED: DG HIP UNILAT WITH PELVIS 1V LEFT  Pubic ramus fracture, left, closed, initial encounter ([place])  Urinary tract infection with hematuria, site unspecified  PVC's (premature ventricular contractions)  Pain of left hip joint  ED Assessment/Plan  Hip x-ray: Left inferior pubic rate is fracture with some callus formation. This may be old. See radiology report for details.  UA consistent with UTI  EKG: Sinus rhythm with frequent multifocal PVCs, LVH, ST depression  in the lateral leads the or, V5, V6, no ST elevation. No reciprocal changes. No previous EKG for comparison.  Transferring to the ED as patient has multiple medical issues and multifocal PVCs with some ST depression in lateral leads. No fever that she is safe to be discharged home. Transferring via EMS for cardiac monitoring. Discussed labs, imaging, rationale for transfer with patient and family member, they agreed to transfer to [facility] ED.   No orders of the defined types were placed in this encounter.   *This clinic note was created using Dragon dictation software. Therefore, there may be occasional mistakes despite careful proofreading.  ?    Melynda Ripple, MD 07/17/16 [phone]

## 2016-07-17 NOTE — ED Triage Notes (Signed)
Pt c/o left hip pain she has had a hip replacement in that hip and she also c/o lower leg edema, it is minimal.

## 2016-07-17 NOTE — ED Notes (Signed)
During pt assessment, pt had to use the BR, pt ambulated without difficulty to RM bathroom with stand by assistance. Pt denied pain at this time of ambulation.

## 2016-07-17 NOTE — Discharge Instructions (Signed)
Please seek medical attention for any high fevers, chest pain, shortness of breath, change in behavior, persistent vomiting, bloody stool or any other new or concerning symptoms.  

## 2016-07-20 LAB — URINE CULTURE

## 2016-07-27 ENCOUNTER — Encounter: Payer: Self-pay | Admitting: Family Medicine

## 2016-07-27 ENCOUNTER — Ambulatory Visit (INDEPENDENT_AMBULATORY_CARE_PROVIDER_SITE_OTHER): Payer: Medicare Other | Admitting: Family Medicine

## 2016-07-27 VITALS — BP 160/82 | HR 84 | Resp 16 | Ht 59.0 in | Wt 108.0 lb

## 2016-07-27 DIAGNOSIS — R002 Palpitations: Secondary | ICD-10-CM | POA: Diagnosis not present

## 2016-07-27 DIAGNOSIS — N3000 Acute cystitis without hematuria: Secondary | ICD-10-CM

## 2016-07-27 MED ORDER — CIPROFLOXACIN HCL 250 MG PO TABS: 250.0000 mg | ORAL_TABLET | Freq: Two times a day (BID) | ORAL | 0 refills | Status: DC

## 2016-07-27 NOTE — Progress Notes (Signed)
Name: Lorraine Fischer   MRN: [id number]    DOB: Feb 07, 1923   Date:07/27/2016       Progress Note  Subjective  Chief Complaint  Chief Complaint  Patient presents with  . Urinary Tract Infection    chronic UTIs -having lower abdominal pain and pressure.     Patient has ongoing fatigue with dyspnea/ irregular heart beat due to PVC's   Urinary Tract Infection   This is a recurrent problem. The current episode started in the past 7 days. The problem occurs intermittently. The problem has been waxing and waning. The quality of the pain is described as aching. The pain is at a severity of 3/10. The pain is mild. There has been no fever. She is not sexually active. There is no history of pyelonephritis. Associated symptoms include frequency. Pertinent negatives include no chills, discharge, flank pain, hematuria, hesitancy, nausea, sweats, urgency or vomiting. She has tried nothing for the symptoms. The treatment provided no relief. Her past medical history is significant for recurrent UTIs. recheck culture/ Klebsiella    No problem-specific Assessment & Plan notes found for this encounter.   Past Medical History:  Diagnosis Date  . Diabetes mellitus without complication ([place])   . Hyperlipidemia   . Hypertension   . Thyroid disease     Past Surgical History:  Procedure Laterality Date  . ANKLE SURGERY    . HIP FRACTURE SURGERY    . WRIST SURGERY Right     History reviewed. No pertinent family history.  Social History   Social History  . Marital status: Widowed    Spouse name: N/A  . Number of children: N/A  . Years of education: N/A   Occupational History  . Not on file.   Social History Main Topics  . Smoking status: Never Smoker  . Smokeless tobacco: Never Used  . Alcohol use No  . Drug use: No  . Sexual activity: Not Currently   Other Topics Concern  . Not on file   Social History Narrative  . No narrative on file    Allergies  Allergen Reactions  . Sulfa  Antibiotics Shortness Of Breath     Review of Systems  Constitutional: Negative for chills, fever, malaise/fatigue and weight loss.  HENT: Negative for ear discharge, ear pain and sore throat.   Eyes: Negative for blurred vision.  Respiratory: Negative for cough, sputum production, shortness of breath and wheezing.   Cardiovascular: Negative for chest pain, palpitations and leg swelling.  Gastrointestinal: Negative for abdominal pain, blood in stool, constipation, diarrhea, heartburn, melena, nausea and vomiting.  Genitourinary: Positive for frequency. Negative for dysuria, flank pain, hematuria, hesitancy and urgency.  Musculoskeletal: Negative for back pain, joint pain, myalgias and neck pain.  Skin: Negative for rash.  Neurological: Negative for dizziness, tingling, sensory change, focal weakness and headaches.  Endo/Heme/Allergies: Negative for environmental allergies and polydipsia. Does not bruise/bleed easily.  Psychiatric/Behavioral: Negative for depression and suicidal ideas. The patient is not nervous/anxious and does not have insomnia.      Objective  Vitals:   07/27/16 1431 07/27/16 1436  BP: (!) 180/82 (!) 160/82  Pulse: 84   Resp: 16   SpO2: 98%   Weight: 108 lb (49 kg)   Height: 4\' 11"  (1.499 m)     Physical Exam  Constitutional: She is well-developed, well-nourished, and in no distress. No distress.  HENT:  Head: Normocephalic and atraumatic.  Right Ear: External ear normal.  Left Ear: External ear normal.  Nose:  Nose normal.  Mouth/Throat: Oropharynx is clear and moist.  Eyes: Conjunctivae and EOM are normal. Pupils are equal, round, and reactive to light. Right eye exhibits no discharge. Left eye exhibits no discharge.  Neck: Normal range of motion. Neck supple. No JVD present. No thyromegaly present.  Cardiovascular: Normal rate, regular rhythm, normal heart sounds and intact distal pulses.  Exam reveals no gallop and no friction rub.   No murmur  heard. Pulmonary/Chest: Effort normal and breath sounds normal. No respiratory distress. She has no wheezes. She has no rales.  Abdominal: Soft. Bowel sounds are normal. She exhibits no mass. There is no tenderness. There is no rebound and no guarding.  Musculoskeletal: Normal range of motion. She exhibits no edema.  Lymphadenopathy:    She has no cervical adenopathy.  Neurological: She is alert. She has normal reflexes.  Skin: Skin is warm and dry. She is not diaphoretic.  Psychiatric: Mood and affect normal.  Nursing note and vitals reviewed.     Assessment & Plan  Problem List Items Addressed This Visit    None    Visit Diagnoses    Palpitations    -  Primary   Relevant Orders   EKG 12-Lead (Completed)   Ambulatory referral to Cardiology   Acute cystitis without hematuria       klebsiella/ sensitive to cipro        Dr. Macon [facility]  07/27/16

## 2016-08-03 DIAGNOSIS — R002 Palpitations: Secondary | ICD-10-CM | POA: Diagnosis not present

## 2016-08-03 DIAGNOSIS — R0602 Shortness of breath: Secondary | ICD-10-CM | POA: Diagnosis not present

## 2016-08-03 DIAGNOSIS — I10 Essential (primary) hypertension: Secondary | ICD-10-CM | POA: Diagnosis not present

## 2016-08-06 ENCOUNTER — Other Ambulatory Visit: Payer: Self-pay | Admitting: Family Medicine

## 2016-08-06 DIAGNOSIS — E119 Type 2 diabetes mellitus without complications: Secondary | ICD-10-CM

## 2016-08-09 DIAGNOSIS — R002 Palpitations: Secondary | ICD-10-CM | POA: Diagnosis not present

## 2016-09-11 DIAGNOSIS — R0602 Shortness of breath: Secondary | ICD-10-CM | POA: Diagnosis not present

## 2016-09-19 DIAGNOSIS — I10 Essential (primary) hypertension: Secondary | ICD-10-CM | POA: Diagnosis not present

## 2016-09-19 DIAGNOSIS — R002 Palpitations: Secondary | ICD-10-CM | POA: Diagnosis not present

## 2016-09-19 DIAGNOSIS — R0602 Shortness of breath: Secondary | ICD-10-CM | POA: Diagnosis not present

## 2016-09-20 ENCOUNTER — Ambulatory Visit (INDEPENDENT_AMBULATORY_CARE_PROVIDER_SITE_OTHER): Payer: Medicare Other | Admitting: Family Medicine

## 2016-09-20 ENCOUNTER — Encounter: Payer: Self-pay | Admitting: Family Medicine

## 2016-09-20 VITALS — BP 118/80 | HR 78 | Ht 59.0 in | Wt 104.0 lb

## 2016-09-20 DIAGNOSIS — T07XXXA Unspecified multiple injuries, initial encounter: Secondary | ICD-10-CM

## 2016-09-20 DIAGNOSIS — R296 Repeated falls: Secondary | ICD-10-CM | POA: Diagnosis not present

## 2016-09-20 DIAGNOSIS — N3090 Cystitis, unspecified without hematuria: Secondary | ICD-10-CM

## 2016-09-20 LAB — POCT URINALYSIS DIPSTICK
Bilirubin, UA: NEGATIVE
Blood, UA: NEGATIVE
Glucose, UA: NEGATIVE
Ketones, UA: NEGATIVE
LEUKOCYTES UA: NEGATIVE
NITRITE UA: POSITIVE
PH UA: 5
PROTEIN UA: NEGATIVE
Spec Grav, UA: 1.02
Urobilinogen, UA: 0.2

## 2016-09-20 MED ORDER — CIPROFLOXACIN HCL 250 MG PO TABS: 250.0000 mg | ORAL_TABLET | Freq: Two times a day (BID) | ORAL | 0 refills | Status: DC

## 2016-09-20 NOTE — Progress Notes (Signed)
Name: Lorraine Fischer   MRN: [id number]    DOB: [birth year]   Date:09/20/2016       Progress Note  Subjective  Chief Complaint  Chief Complaint  Patient presents with  . Urinary Tract Infection    dark urine, pressure when urinates    Urinary Tract Infection   This is a new problem. The current episode started in the past 7 days. The problem occurs intermittently. The problem has been waxing and waning. The quality of the pain is described as aching. The pain is at a severity of 7/10 (at worst). There has been no fever. Associated symptoms include frequency and urgency. Pertinent negatives include no chills, flank pain, hematuria or nausea. Associated symptoms comments: Urinary incontinence/no dysuria. She has tried nothing for the symptoms. The treatment provided mild relief. Her past medical history is significant for recurrent UTIs.    No problem-specific Assessment & Plan notes found for this encounter.   Past Medical History:  Diagnosis Date  . Diabetes mellitus without complication ([place])   . Hyperlipidemia   . Hypertension   . Thyroid disease     Past Surgical History:  Procedure Laterality Date  . ANKLE SURGERY    . HIP FRACTURE SURGERY    . WRIST SURGERY Right     No family history on file.  Social History   Social History  . Marital status: Widowed    Spouse name: N/A  . Number of children: N/A  . Years of education: N/A   Occupational History  . Not on file.   Social History Main Topics  . Smoking status: Never Smoker  . Smokeless tobacco: Never Used  . Alcohol use No  . Drug use: No  . Sexual activity: Not Currently   Other Topics Concern  . Not on file   Social History Narrative  . No narrative on file    Allergies  Allergen Reactions  . Sulfa Antibiotics Shortness Of Breath     Review of Systems  Constitutional: Negative for chills, fever, malaise/fatigue and weight loss.  HENT: Negative for ear discharge, ear pain and sore throat.    Eyes: Negative for blurred vision.  Respiratory: Negative for cough, sputum production, shortness of breath and wheezing.   Cardiovascular: Negative for chest pain, palpitations and leg swelling.  Gastrointestinal: Negative for abdominal pain, blood in stool, constipation, diarrhea, heartburn, melena and nausea.  Genitourinary: Positive for frequency and urgency. Negative for dysuria, flank pain and hematuria.  Musculoskeletal: Negative for back pain, joint pain, myalgias and neck pain.  Skin: Negative for rash.  Neurological: Negative for dizziness, tingling, sensory change, focal weakness and headaches.  Endo/Heme/Allergies: Negative for environmental allergies and polydipsia. Does not bruise/bleed easily.  Psychiatric/Behavioral: Negative for depression and suicidal ideas. The patient is not nervous/anxious and does not have insomnia.      Objective  Vitals:   09/20/16 1442  BP: 118/80  Pulse: 78  Weight: 104 lb (47.2 kg)  Height: 4\' 11"  (1.499 m)    Physical Exam  Constitutional: She is well-developed, well-nourished, and in no distress. No distress.  HENT:  Head: Normocephalic and atraumatic.  Right Ear: External ear normal.  Left Ear: External ear normal.  Nose: Nose normal.  Mouth/Throat: Oropharynx is clear and moist.  Eyes: Conjunctivae and EOM are normal. Pupils are equal, round, and reactive to light. Right eye exhibits no discharge. Left eye exhibits no discharge.  Neck: Normal range of motion. Neck supple. No JVD present. No thyromegaly present.  Cardiovascular: Normal rate, regular rhythm, normal heart sounds and intact distal pulses.  Exam reveals no gallop and no friction rub.   No murmur heard. Pulmonary/Chest: Effort normal and breath sounds normal. She has no wheezes. She has no rales.  Abdominal: Soft. Bowel sounds are normal. She exhibits no mass. There is tenderness in the suprapubic area. There is no guarding.  Musculoskeletal: Normal range of motion. She  exhibits no edema.  Lymphadenopathy:    She has no cervical adenopathy.  Neurological: She is alert.  Skin: Skin is warm and dry. She is not diaphoretic.  Psychiatric: Mood and affect normal.  Nursing note and vitals reviewed.     Assessment & Plan  Problem List Items Addressed This Visit      Musculoskeletal and Integument   Multiple fractures     Genitourinary   Recurrent cystitis - Primary   Relevant Medications   ciprofloxacin (CIPRO) 250 MG tablet   Other Relevant Orders   POCT urinalysis dipstick (Completed)     Other   Frequent falls        Dr. Mac[facility]  09/20/16

## 2016-10-18 ENCOUNTER — Ambulatory Visit (INDEPENDENT_AMBULATORY_CARE_PROVIDER_SITE_OTHER): Payer: Medicare Other | Admitting: Family Medicine

## 2016-10-18 ENCOUNTER — Encounter: Payer: Self-pay | Admitting: Family Medicine

## 2016-10-18 VITALS — BP 130/80 | HR 80 | Ht 59.0 in | Wt 106.0 lb

## 2016-10-18 DIAGNOSIS — R350 Frequency of micturition: Secondary | ICD-10-CM | POA: Diagnosis not present

## 2016-10-18 DIAGNOSIS — L03032 Cellulitis of left toe: Secondary | ICD-10-CM

## 2016-10-18 LAB — POCT URINALYSIS DIPSTICK
BILIRUBIN UA: NEGATIVE
GLUCOSE UA: NEGATIVE
KETONES UA: NEGATIVE
LEUKOCYTES UA: NEGATIVE
Nitrite, UA: POSITIVE
Spec Grav, UA: 1.01
Urobilinogen, UA: 0.2
pH, UA: 6

## 2016-10-18 MED ORDER — CEPHALEXIN 500 MG PO CAPS: 500.0000 mg | ORAL_CAPSULE | Freq: Three times a day (TID) | ORAL | 0 refills | Status: DC

## 2016-10-18 NOTE — Progress Notes (Signed)
Name: Lorraine Fischer   MRN: [id number]    DOB: [birth year]   Date:10/18/2016       Progress Note  Subjective  Chief Complaint  Chief Complaint  Patient presents with  . Toe Pain    L) great toe- "had a corn on the end and pulled it off with my fingernail"    Toe Pain   The incident occurred more than 1 week ago. The incident occurred at home. There was no injury mechanism (self removal corn). The pain is present in the right toes (right great toe). The quality of the pain is described as aching. The pain is moderate. The pain has been constant since onset. Pertinent negatives include no tingling. She reports no foreign bodies present. The symptoms are aggravated by movement, weight bearing and palpation. Treatments tried: topical antiseptic/antibacterial. The treatment provided no relief.  Urinary Frequency   This is a new problem. The current episode started in the past 7 days. The problem occurs intermittently. The problem has been waxing and waning. The quality of the pain is described as burning. The pain is mild. There has been no fever. There is no history of pyelonephritis. Associated symptoms include frequency. Pertinent negatives include no chills, discharge, flank pain, hematuria, hesitancy, nausea, sweats, urgency or vomiting. She has tried nothing for the symptoms.    No problem-specific Assessment & Plan notes found for this encounter.   Past Medical History:  Diagnosis Date  . Diabetes mellitus without complication ([place])   . Hyperlipidemia   . Hypertension   . Thyroid disease     Past Surgical History:  Procedure Laterality Date  . ANKLE SURGERY    . HIP FRACTURE SURGERY    . WRIST SURGERY Right     No family history on file.  Social History   Social History  . Marital status: Widowed    Spouse name: N/A  . Number of children: N/A  . Years of education: N/A   Occupational History  . Not on file.   Social History Main Topics  . Smoking status: Never Smoker   . Smokeless tobacco: Never Used  . Alcohol use No  . Drug use: No  . Sexual activity: Not Currently   Other Topics Concern  . Not on file   Social History Narrative  . No narrative on file    Allergies  Allergen Reactions  . Sulfa Antibiotics Shortness Of Breath    Outpatient Medications Prior to Visit  Medication Sig Dispense Refill  . Cranberry 200 MG CAPS Take 1 capsule by mouth daily.    Marland Kitchen levothyroxine (SYNTHROID, LEVOTHROID) 50 MCG tablet Take 1 tablet (50 mcg total) by mouth daily before breakfast. 90 tablet 1  . lovastatin (MEVACOR) 40 MG tablet Take 1 tablet (40 mg total) by mouth daily. 90 tablet 1  . metFORMIN (GLUCOPHAGE) 500 MG tablet TAKE ONE TABLET BY MOUTH ONCE DAILY WITH BREAKFAT 90 tablet 0  . metoprolol succinate (TOPROL-XL) 25 MG 24 hr tablet Take 25 mg by mouth daily. Dr Ubaldo Glassing    . ciprofloxacin (CIPRO) 250 MG tablet Take 1 tablet (250 mg total) by mouth 2 (two) times daily. 6 tablet 0   No facility-administered medications prior to visit.     Review of Systems  Constitutional: Negative for chills, fever, malaise/fatigue and weight loss.  HENT: Negative for ear discharge, ear pain and sore throat.   Eyes: Negative for blurred vision.  Respiratory: Negative for cough, sputum production, shortness of breath and wheezing.  Cardiovascular: Negative for chest pain, palpitations and leg swelling.  Gastrointestinal: Negative for abdominal pain, blood in stool, constipation, diarrhea, heartburn, melena, nausea and vomiting.  Genitourinary: Positive for dysuria and frequency. Negative for flank pain, hematuria, hesitancy and urgency.  Musculoskeletal: Negative for back pain, joint pain, myalgias and neck pain.  Skin: Negative for rash.       Soreness great toe  Neurological: Negative for dizziness, tingling, sensory change, focal weakness and headaches.  Endo/Heme/Allergies: Negative for environmental allergies and polydipsia. Does not bruise/bleed easily.   Psychiatric/Behavioral: Negative for depression and suicidal ideas. The patient is not nervous/anxious and does not have insomnia.      Objective  Vitals:   10/18/16 1405  BP: 130/80  Pulse: 80  Weight: 106 lb (48.1 kg)  Height: 4\' 11"  (1.499 m)    Physical Exam  Constitutional: She is well-developed, well-nourished, and in no distress. No distress.  HENT:  Head: Normocephalic and atraumatic.  Right Ear: External ear normal.  Left Ear: External ear normal.  Nose: Nose normal.  Mouth/Throat: Oropharynx is clear and moist.  Eyes: Conjunctivae and EOM are normal. Pupils are equal, round, and reactive to light. Right eye exhibits no discharge. Left eye exhibits no discharge.  Neck: Normal range of motion. Neck supple. No JVD present. No thyromegaly present.  Cardiovascular: Normal rate, regular rhythm, normal heart sounds and intact distal pulses.  Exam reveals no gallop and no friction rub.   No murmur heard. Pulmonary/Chest: Effort normal and breath sounds normal.  Abdominal: Soft. Bowel sounds are normal. She exhibits no mass. There is no tenderness. There is no guarding.  Musculoskeletal: Normal range of motion. She exhibits no edema.  Lymphadenopathy:    She has no cervical adenopathy.  Neurological: She is alert. She has normal reflexes.  Skin: Skin is warm and dry. She is not diaphoretic. There is erythema.  tender  Psychiatric: Mood and affect normal.      Assessment & Plan  Problem List Items Addressed This Visit    None    Visit Diagnoses    Cellulitis of toe of left foot    -  Primary   Relevant Medications   cephALEXin (KEFLEX) 500 MG capsule   Other Relevant Orders   Ambulatory referral to Podiatry   Urinary frequency       Relevant Orders   POCT urinalysis dipstick (Completed)      Meds ordered this encounter  Medications  . cephALEXin (KEFLEX) 500 MG capsule    Sig: Take 1 capsule (500 mg total) by mouth 3 (three) times daily.    Dispense:  10  capsule    Refill:  0      Dr. Otilio Miu [facility]  10/18/16

## 2016-10-19 DIAGNOSIS — B351 Tinea unguium: Secondary | ICD-10-CM | POA: Diagnosis not present

## 2016-10-19 DIAGNOSIS — E1151 Type 2 diabetes mellitus with diabetic peripheral angiopathy without gangrene: Secondary | ICD-10-CM | POA: Diagnosis not present

## 2016-10-24 ENCOUNTER — Other Ambulatory Visit: Payer: Self-pay

## 2016-10-24 DIAGNOSIS — L03032 Cellulitis of left toe: Secondary | ICD-10-CM

## 2016-10-24 MED ORDER — CEPHALEXIN 500 MG PO CAPS: 500.0000 mg | ORAL_CAPSULE | Freq: Three times a day (TID) | ORAL | 0 refills | Status: DC

## 2016-10-29 DIAGNOSIS — E1151 Type 2 diabetes mellitus with diabetic peripheral angiopathy without gangrene: Secondary | ICD-10-CM | POA: Diagnosis not present

## 2016-10-29 DIAGNOSIS — M79675 Pain in left toe(s): Secondary | ICD-10-CM | POA: Diagnosis not present

## 2016-11-01 ENCOUNTER — Telehealth: Payer: Self-pay

## 2016-11-01 DIAGNOSIS — T3695XA Adverse effect of unspecified systemic antibiotic, initial encounter: Principal | ICD-10-CM

## 2016-11-01 DIAGNOSIS — B379 Candidiasis, unspecified: Secondary | ICD-10-CM

## 2016-11-01 MED ORDER — NYSTATIN 100000 UNIT/GM EX CREA: 1.0000 "application " | TOPICAL_CREAM | Freq: Two times a day (BID) | CUTANEOUS | 0 refills | Status: DC

## 2016-11-01 MED ORDER — FLUCONAZOLE 150 MG PO TABS: 150.0000 mg | ORAL_TABLET | Freq: Once | ORAL | 0 refills | Status: AC

## 2016-11-01 NOTE — Telephone Encounter (Signed)
Pt called in stating she was still irritated from bladder infection. Sent in diflucan and nystatin cream to pharmacy

## 2016-11-05 ENCOUNTER — Ambulatory Visit (HOSPITAL_COMMUNITY)
Admission: AD | Admit: 2016-11-05 | Discharge: 2016-11-05 | Disposition: A | Discharge status: Home / Self Care | Payer: Medicare Other | Source: Other Acute Inpatient Hospital | Attending: Emergency Medicine | Admitting: Emergency Medicine

## 2016-11-05 ENCOUNTER — Encounter: Payer: Self-pay | Admitting: *Deleted

## 2016-11-05 ENCOUNTER — Emergency Department
Admission: EM | Admit: 2016-11-05 | Discharge: 2016-11-05 | Payer: Medicare Other | Attending: Emergency Medicine | Admitting: Emergency Medicine

## 2016-11-05 ENCOUNTER — Emergency Department: Payer: Medicare Other

## 2016-11-05 DIAGNOSIS — Y999 Unspecified external cause status: Secondary | ICD-10-CM | POA: Diagnosis not present

## 2016-11-05 DIAGNOSIS — W19XXXA Unspecified fall, initial encounter: Secondary | ICD-10-CM | POA: Diagnosis not present

## 2016-11-05 DIAGNOSIS — R931 Abnormal findings on diagnostic imaging of heart and coronary circulation: Secondary | ICD-10-CM | POA: Diagnosis not present

## 2016-11-05 DIAGNOSIS — S22019D Unspecified fracture of first thoracic vertebra, subsequent encounter for fracture with routine healing: Secondary | ICD-10-CM | POA: Diagnosis not present

## 2016-11-05 DIAGNOSIS — I6523 Occlusion and stenosis of bilateral carotid arteries: Secondary | ICD-10-CM | POA: Diagnosis not present

## 2016-11-05 DIAGNOSIS — S0003XA Contusion of scalp, initial encounter: Secondary | ICD-10-CM | POA: Diagnosis not present

## 2016-11-05 DIAGNOSIS — I2510 Atherosclerotic heart disease of native coronary artery without angina pectoris: Secondary | ICD-10-CM | POA: Diagnosis not present

## 2016-11-05 DIAGNOSIS — S066X0D Traumatic subarachnoid hemorrhage without loss of consciousness, subsequent encounter: Secondary | ICD-10-CM | POA: Diagnosis not present

## 2016-11-05 DIAGNOSIS — I440 Atrioventricular block, first degree: Secondary | ICD-10-CM | POA: Diagnosis not present

## 2016-11-05 DIAGNOSIS — I517 Cardiomegaly: Secondary | ICD-10-CM | POA: Diagnosis not present

## 2016-11-05 DIAGNOSIS — E119 Type 2 diabetes mellitus without complications: Secondary | ICD-10-CM | POA: Diagnosis not present

## 2016-11-05 DIAGNOSIS — R1310 Dysphagia, unspecified: Secondary | ICD-10-CM | POA: Diagnosis not present

## 2016-11-05 DIAGNOSIS — W1839XA Other fall on same level, initial encounter: Secondary | ICD-10-CM | POA: Diagnosis not present

## 2016-11-05 DIAGNOSIS — S2220XA Unspecified fracture of sternum, initial encounter for closed fracture: Secondary | ICD-10-CM | POA: Diagnosis not present

## 2016-11-05 DIAGNOSIS — S066X9A Traumatic subarachnoid hemorrhage with loss of consciousness of unspecified duration, initial encounter: Secondary | ICD-10-CM | POA: Diagnosis not present

## 2016-11-05 DIAGNOSIS — S42301A Unspecified fracture of shaft of humerus, right arm, initial encounter for closed fracture: Secondary | ICD-10-CM | POA: Diagnosis not present

## 2016-11-05 DIAGNOSIS — E1140 Type 2 diabetes mellitus with diabetic neuropathy, unspecified: Secondary | ICD-10-CM | POA: Diagnosis not present

## 2016-11-05 DIAGNOSIS — R262 Difficulty in walking, not elsewhere classified: Secondary | ICD-10-CM | POA: Diagnosis not present

## 2016-11-05 DIAGNOSIS — S22018A Other fracture of first thoracic vertebra, initial encounter for closed fracture: Secondary | ICD-10-CM | POA: Diagnosis not present

## 2016-11-05 DIAGNOSIS — Z79899 Other long term (current) drug therapy: Secondary | ICD-10-CM | POA: Diagnosis not present

## 2016-11-05 DIAGNOSIS — I493 Ventricular premature depolarization: Secondary | ICD-10-CM | POA: Diagnosis not present

## 2016-11-05 DIAGNOSIS — R079 Chest pain, unspecified: Secondary | ICD-10-CM | POA: Diagnosis not present

## 2016-11-05 DIAGNOSIS — I10 Essential (primary) hypertension: Secondary | ICD-10-CM | POA: Insufficient documentation

## 2016-11-05 DIAGNOSIS — M6250 Muscle wasting and atrophy, not elsewhere classified, unspecified site: Secondary | ICD-10-CM | POA: Diagnosis not present

## 2016-11-05 DIAGNOSIS — W19XXXD Unspecified fall, subsequent encounter: Secondary | ICD-10-CM | POA: Diagnosis not present

## 2016-11-05 DIAGNOSIS — S0990XA Unspecified injury of head, initial encounter: Secondary | ICD-10-CM | POA: Diagnosis present

## 2016-11-05 DIAGNOSIS — S99912A Unspecified injury of left ankle, initial encounter: Secondary | ICD-10-CM | POA: Diagnosis not present

## 2016-11-05 DIAGNOSIS — Z7982 Long term (current) use of aspirin: Secondary | ICD-10-CM | POA: Insufficient documentation

## 2016-11-05 DIAGNOSIS — W1809XA Striking against other object with subsequent fall, initial encounter: Secondary | ICD-10-CM | POA: Insufficient documentation

## 2016-11-05 DIAGNOSIS — S299XXA Unspecified injury of thorax, initial encounter: Secondary | ICD-10-CM | POA: Diagnosis not present

## 2016-11-05 DIAGNOSIS — Y929 Unspecified place or not applicable: Secondary | ICD-10-CM | POA: Insufficient documentation

## 2016-11-05 DIAGNOSIS — S4991XA Unspecified injury of right shoulder and upper arm, initial encounter: Secondary | ICD-10-CM | POA: Diagnosis not present

## 2016-11-05 DIAGNOSIS — Z7984 Long term (current) use of oral hypoglycemic drugs: Secondary | ICD-10-CM | POA: Insufficient documentation

## 2016-11-05 DIAGNOSIS — S22019A Unspecified fracture of first thoracic vertebra, initial encounter for closed fracture: Secondary | ICD-10-CM | POA: Diagnosis not present

## 2016-11-05 DIAGNOSIS — I609 Nontraumatic subarachnoid hemorrhage, unspecified: Secondary | ICD-10-CM | POA: Diagnosis not present

## 2016-11-05 DIAGNOSIS — S2222XD Fracture of body of sternum, subsequent encounter for fracture with routine healing: Secondary | ICD-10-CM | POA: Diagnosis not present

## 2016-11-05 DIAGNOSIS — R296 Repeated falls: Secondary | ICD-10-CM | POA: Diagnosis not present

## 2016-11-05 DIAGNOSIS — Y939 Activity, unspecified: Secondary | ICD-10-CM | POA: Diagnosis not present

## 2016-11-05 DIAGNOSIS — M8580 Other specified disorders of bone density and structure, unspecified site: Secondary | ICD-10-CM | POA: Diagnosis not present

## 2016-11-05 DIAGNOSIS — S2222XA Fracture of body of sternum, initial encounter for closed fracture: Secondary | ICD-10-CM | POA: Diagnosis not present

## 2016-11-05 DIAGNOSIS — S3991XA Unspecified injury of abdomen, initial encounter: Secondary | ICD-10-CM | POA: Diagnosis not present

## 2016-11-05 DIAGNOSIS — E039 Hypothyroidism, unspecified: Secondary | ICD-10-CM | POA: Diagnosis not present

## 2016-11-05 DIAGNOSIS — M6281 Muscle weakness (generalized): Secondary | ICD-10-CM | POA: Diagnosis not present

## 2016-11-05 DIAGNOSIS — M85871 Other specified disorders of bone density and structure, right ankle and foot: Secondary | ICD-10-CM | POA: Diagnosis not present

## 2016-11-05 DIAGNOSIS — J90 Pleural effusion, not elsewhere classified: Secondary | ICD-10-CM | POA: Diagnosis not present

## 2016-11-05 DIAGNOSIS — E785 Hyperlipidemia, unspecified: Secondary | ICD-10-CM | POA: Diagnosis not present

## 2016-11-05 DIAGNOSIS — S066X0A Traumatic subarachnoid hemorrhage without loss of consciousness, initial encounter: Secondary | ICD-10-CM | POA: Diagnosis not present

## 2016-11-05 DIAGNOSIS — S199XXA Unspecified injury of neck, initial encounter: Secondary | ICD-10-CM | POA: Diagnosis not present

## 2016-11-05 DIAGNOSIS — Z7401 Bed confinement status: Secondary | ICD-10-CM | POA: Diagnosis not present

## 2016-11-05 DIAGNOSIS — S42201A Unspecified fracture of upper end of right humerus, initial encounter for closed fracture: Secondary | ICD-10-CM | POA: Diagnosis not present

## 2016-11-05 LAB — CBC
HEMATOCRIT: 39.3 % (ref 35.0–47.0)
HEMOGLOBIN: 13.2 g/dL (ref 12.0–16.0)
MCH: 31.5 pg (ref 26.0–34.0)
MCHC: 33.6 g/dL (ref 32.0–36.0)
MCV: 93.7 fL (ref 80.0–100.0)
Platelets: 200 10*3/uL (ref 150–440)
RBC: 4.2 MIL/uL (ref 3.80–5.20)
RDW: 14.8 % — AB (ref 11.5–14.5)
WBC: 16.4 10*3/uL — AB (ref 3.6–11.0)

## 2016-11-05 LAB — BASIC METABOLIC PANEL
ANION GAP: 6 (ref 5–15)
BUN: 20 mg/dL (ref 6–20)
CALCIUM: 9.6 mg/dL (ref 8.9–10.3)
CO2: 25 mmol/L (ref 22–32)
Chloride: 105 mmol/L (ref 101–111)
Creatinine, Ser: 0.74 mg/dL (ref 0.44–1.00)
GFR calc Af Amer: >60 mL/min (ref >60)
Glucose, Bld: 195 mg/dL — ABNORMAL HIGH (ref 65–99)
POTASSIUM: 4.2 mmol/L (ref 3.5–5.1)
Sodium: 136 mmol/L (ref 135–145)

## 2016-11-05 LAB — TROPONIN I: TROPONIN I: 0.06 ng/mL — AB (ref <0.03)

## 2016-11-05 MED ORDER — SODIUM CHLORIDE 0.9 % IV SOLN
0.4000 ug/kg | Freq: Once | INTRAVENOUS | Status: AC
  Administered 2016-11-05: 19.2 ug via INTRAVENOUS
  Filled 2016-11-05: qty 4.8

## 2016-11-05 NOTE — ED Notes (Signed)
Patient has left sided facial droop and family states they have noticed this for the past few months.  Patient's grip strength is slightly stronger in her right hand.

## 2016-11-05 NOTE — ED Notes (Signed)
Pt cleaned of wet depends. Pt states she hadn't wanted to bother [place], so did let us know she was wet. Pt also had on two depends with 2 pads stuffed inside. States the depends are expensive and does that so doesn't have to change as often.

## 2016-11-05 NOTE — ED Notes (Signed)
Report received from [place] - pt to receive ddavp - per pharmacy was already sent down

## 2016-11-05 NOTE — ED Notes (Signed)
Dr. Clearnce Hasten in room to assess patient.

## 2016-11-05 NOTE — ED Notes (Signed)
Transportation on scene
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
## 2016-11-05 NOTE — ED Provider Notes (Signed)
[facility] Emergency Department Provider Note  ____________________________________________   First MD Initiated Contact with Patient 11/05/16 1538     (approximate)  I have reviewed the triage vital signs and the nursing notes.   HISTORY  Chief Complaint Fall   HPI Lorraine Fischer is a 83 y.o. female with a history of frequent falls on aspirin who is presenting to the emergency department today after falling her backyard. Her family says that she was not supposed to be outside by herself and was found on her back. The patient does not remember the details of the fall and whether she tripped and fell or passed out. She was noted to have a hematoma to the back of her head. She is also complaining of chest pain at this time which is across the front of her chest and worsens with movement.  Patient's sons are at the bedside and say that they think are normal that she would remember the details of the event and are concerned that her amnesia is related to injury from this fall.   Past Medical History:  Diagnosis Date  . Diabetes mellitus without complication ([place])   . Hyperlipidemia   . Hypertension   . Thyroid disease     Patient Active Problem List   Diagnosis Date Noted  . Multiple fractures 09/20/2016  . Frequent falls 09/20/2016  . Recurrent cystitis 09/20/2016  . Osteoporosis 11/08/2015  . Essential hypertension 11/08/2015    Past Surgical History:  Procedure Laterality Date  . ANKLE SURGERY    . HIP FRACTURE SURGERY    . WRIST SURGERY Right     Prior to Admission medications   Medication Sig Start Date End Date Taking? Authorizing Provider  aspirin 81 MG chewable tablet Chew 81 mg by mouth daily.   Yes Historical Provider, MD  Cranberry 200 MG CAPS Take 1 capsule by mouth daily.   Yes Historical Provider, MD  levothyroxine (SYNTHROID, LEVOTHROID) 50 MCG tablet Take 1 tablet (50 mcg total) by mouth daily before breakfast. 01/10/16  Yes  Juline Patch, MD  lovastatin (MEVACOR) 40 MG tablet Take 1 tablet (40 mg total) by mouth daily. 10/11/15  Yes Juline Patch, MD  metFORMIN (GLUCOPHAGE) 500 MG tablet TAKE ONE TABLET BY MOUTH ONCE DAILY WITH BREAKFAT 08/07/16  Yes Juline Patch, MD  metoprolol succinate (TOPROL-XL) 25 MG 24 hr tablet Take 25 mg by mouth daily. Dr Ubaldo Glassing   Yes Historical Provider, MD  nystatin cream (MYCOSTATIN) Apply 1 application topically 2 (two) times daily. 11/01/16  Yes Juline Patch, MD  cephALEXin (KEFLEX) 500 MG capsule Take 1 capsule (500 mg total) by mouth 3 (three) times daily. Patient not taking: Reported on 11/05/2016 10/24/16   Juline Patch, MD    Allergies Sulfa antibiotics  History reviewed. No pertinent family history.  Social History Social History  Substance Use Topics  . Smoking status: Never Smoker  . Smokeless tobacco: Never Used  . Alcohol use No    Review of Systems Constitutional: No fever/chills Eyes: No visual changes. ENT: No sore throat. Cardiovascular: Denies chest pain. Respiratory: Denies shortness of breath. Gastrointestinal: No abdominal pain.  No nausea, no vomiting.  No diarrhea.  No constipation. Genitourinary: Negative for dysuria. Musculoskeletal: Negative for back pain. Skin: Negative for rash. Neurological: Negative for focal weakness or numbness.  10-point ROS otherwise negative.  ____________________________________________   PHYSICAL EXAM:  VITAL SIGNS: ED Triage Vitals  Enc Vitals Group  BP 11/05/16 1453 (!) 209/66     Pulse Rate 11/05/16 1453 72     Resp 11/05/16 1453 18     Temp 11/05/16 1453 97.7 F (36.5 C)     Temp Source 11/05/16 1453 Oral     SpO2 11/05/16 1453 94 %     Weight 11/05/16 1452 106 lb (48.1 kg)     Height 11/05/16 1452 4\' 11"  (1.499 m)     Head Circumference --      Peak Flow --      Pain Score 11/05/16 1452 8     Pain Loc --      Pain Edu? --      Excl. in [place]? --     Constitutional: Alert and oriented.  Well appearing and in no acute distress. Eyes: Conjunctivae are normal. PERRL. EOMI. Head: 6-8 cm oval shaped cephalhematoma to the right occipitoparietal region. Overlying abrasion without any laceration or active bleeding. No depression or bogginess. Nose: No congestion/rhinnorhea. Mouth/Throat: Mucous membranes are moist.   Neck: No stridor.   Cardiovascular: Normal rate, regular rhythm. Grossly normal heart sounds.  Good peripheral circulation with intact and equal dorsalis pedis as well as radial pulses. Respiratory: Normal respiratory effort.  No retractions. Lungs CTAB. Gastrointestinal: Soft and nontender. No distention.  Musculoskeletal: No lower extremity tenderness nor edema.  Chest pain is reproducible palpation over the anterior chest. There is no crepitus or ecchymosis visualized.  Pelvis is stable and nontender. Patient able to elevate both lower extremities with 5 out of 5 strength. Neurologic:  Normal speech and language. No gross focal neurologic deficits are appreciated.  Skin:  Skin is warm, dry and intact. No rash noted. Psychiatric: Mood and affect are normal. Speech and behavior are normal.  ____________________________________________   LABS (all labs ordered are listed, but only abnormal results are displayed)  Labs Reviewed  BASIC METABOLIC PANEL - Abnormal; Notable for the following:       Result Value   Glucose, Bld 195 (*)    All other components within normal limits  CBC - Abnormal; Notable for the following:    WBC 16.4 (*)    RDW 14.8 (*)    All other components within normal limits  TROPONIN I - Abnormal; Notable for the following:    Troponin I 0.06 (*)    All other components within normal limits   ____________________________________________  EKG  ED ECG REPORT I, Doran Stabler, the attending physician, personally viewed and interpreted this ECG.   Date: 11/05/2016  EKG Time: 1449  Rate: 74  Rhythm: normal sinus rhythm  Axis:  normal  Intervals:none  ST&T Change:  ST elevation with concave morphology in V2 and V3 with T-wave inversions in V5 and V6 with one 2 mm of depression. Similar morphology seen on previous EKGs.  ____________________________________________  RADIOLOGY  CT Cervical Spine Wo Contrast (Accession [id number]) (Order [id number])  Imaging  Date: 11/05/2016 Department: [facility] Released By: Constance Goltz, RN (auto-released) Authorizing: Lavonia Drafts, MD  Exam Information   Status Exam Begun  Exam Ended   Final [99] 11/05/2016 3:22 PM 11/05/2016 3:30 PM  PACS Images   Show images for CT Cervical Spine Wo Contrast  Study Result   CLINICAL DATA:  [place] with trauma to the right side of the head and neck.  EXAM: CT HEAD WITHOUT CONTRAST  CT CERVICAL SPINE WITHOUT CONTRAST  TECHNIQUE: Multidetector CT imaging of the head and cervical spine was performed  following the standard protocol without intravenous contrast. Multiplanar CT image reconstructions of the cervical spine were also generated.  COMPARISON:  10/04/2013.  10/05/2013.  FINDINGS: CT HEAD FINDINGS  Brain: The brain shows generalized atrophy. There chronic small-vessel ischemic changes of the deep white matter. Old lacunar infarction right external capsule. No sign of acute infarction or mass lesion. Small amount of traumatic subarachnoid hemorrhage in the right posterior parietal sulci. No subdural hematoma.  Vascular: There is atherosclerotic calcification of the major vessels at the base of the brain.  Skull: No skull fracture.  Sinuses/Orbits: Clear/normal  Other: Right parietal scalp hematoma.  CT CERVICAL SPINE FINDINGS  Alignment: No traumatic malalignment. 2 mm degenerative anterolisthesis at C4-5, C5-6, C6-7 and C7-T1.  Skull base and vertebrae: Partial compression fracture of T1 with loss of height anteriorly of 20%. No retropulsed bone. No  other regional fracture.  Soft tissues and spinal canal: Negative  Disc levels: Facet arthropathy most pronounced at C2-3, C3-4, C4-5 and C5-6. Ordinary mild degenerative spondylosis.  Upper chest: Negative except for benign appearing pleural and parenchymal scarring.  Other: None significant  IMPRESSION: Head CT: Right parietal scalp hematoma. No skull fracture. Small amount of traumatic subarachnoid hemorrhage within the sulci of the right parietal region. No intraparenchymal bleeding. Atrophy and small-vessel changes elsewhere.  Cervical spine CT: Anterior compression fracture at T1 with loss of height of 20%. No retropulsed bone or canal compromise.  I am attempting to call this report by telephone at the time of interpretation on 11/05/2016 at 3:36 pm to Dr. Lavonia Drafts ,, but had been unsuccessful to this point.   Electronically Signed   By: Nelson Chimes M.D.   On: 11/05/2016 15:40  DG Chest 2 View (Accession [id number]) (Order [id number])  Imaging  Date: 11/05/2016 Department: [facility] EMERGENCY DEPARTMENT Released By: Constance Goltz, RN (auto-released) Authorizing: Lavonia Drafts, MD  Exam Information   Status Exam Begun  Exam Ended   Final [99] 11/05/2016 3:20 PM 11/05/2016 3:21 PM  PACS Images   Show images for DG Chest 2 View  Study Result   CLINICAL DATA:  Chest pain, found down.  EXAM: CHEST  2 VIEW  COMPARISON:  03/08/2014  FINDINGS: Cardiomegaly. There is hyperinflation of the lungs compatible with COPD. Linear areas of scarring in the mid and lower lungs bilaterally. Mild cardiomegaly. No effusions. Thoracolumbar scoliosis. No acute bony abnormality.  IMPRESSION: Cardiomegaly.  COPD/ chronic changes.   Electronically Signed   By: Rolm Baptise M.D.   On: 11/05/2016 15:27      ____________________________________________   PROCEDURES  Procedure(s) performed:   Procedures  Critical Care  performed:  CRITICAL CARE Performed by: Doran Stabler   Total critical care time: 35 minutes  Critical care time was exclusive of separately billable procedures and treating other patients.  Critical care was necessary to treat or prevent imminent or life-threatening deterioration.  Critical care was time spent personally by me on the following activities: development of treatment plan with patient and/or surrogate as well as nursing, discussions with consultants, evaluation of patient's response to treatment, examination of patient, obtaining history from patient or surrogate, ordering and performing treatments and interventions, ordering and review of laboratory studies, ordering and review of radiographic studies, pulse oximetry and re-evaluation of patient's condition.   ____________________________________________   INITIAL IMPRESSION / ASSESSMENT AND PLAN / ED COURSE  Pertinent labs & imaging results that were available during my care of the patient were reviewed by me  and considered in my medical decision making (see chart for details).  ----------------------------------------- 6:10 PM on 11/05/2016 -----------------------------------------  Discussed case with neurosurgery, Dr. Aris Lot, who recommends transfer to [facility]. Discussed the case with the trauma surgeon at [facility], Dr. Dorothyann Peng, who accepts the patient for ER to ER transfer. Abnormal EKG and slight elevated troponin. However, the patient has an elevated troponin at her baseline. Chest pain is very reproducible tenderness. Suspect musculoskeletal injury causing chest pain. DDAVP was also recommended by the neurosurgical attending.  Patient as well as the family are aware of the bleed to the brain as well as need for transfer and are willing to comply.      ____________________________________________   FINAL CLINICAL IMPRESSION(S) / ED DIAGNOSES  Traumatic subarachnoid hemorrhage. Chest pain.    NEW MEDICATIONS  STARTED DURING THIS VISIT:  New Prescriptions   No medications on file     Note:  This document was prepared using Dragon voice recognition software and may include unintentional dictation errors.    Orbie Pyo, MD 11/05/16 [phone]

## 2016-11-05 NOTE — ED Triage Notes (Signed)
Son states he found her on the ground outside, unsure of how long she was on the floor, states her head was on concrete, bump to back of head, states chest soreness at present, pt is unable to state if her chest hurt before the fall, awake and alert

## 2016-11-05 NOTE — ED Notes (Signed)
Report given to [facility] - they will be here in about 15 mins

## 2016-11-09 DIAGNOSIS — I2510 Atherosclerotic heart disease of native coronary artery without angina pectoris: Secondary | ICD-10-CM | POA: Diagnosis not present

## 2016-11-09 DIAGNOSIS — E119 Type 2 diabetes mellitus without complications: Secondary | ICD-10-CM | POA: Diagnosis not present

## 2016-11-09 DIAGNOSIS — M79632 Pain in left forearm: Secondary | ICD-10-CM | POA: Diagnosis not present

## 2016-11-09 DIAGNOSIS — Z7401 Bed confinement status: Secondary | ICD-10-CM | POA: Diagnosis not present

## 2016-11-09 DIAGNOSIS — M6250 Muscle wasting and atrophy, not elsewhere classified, unspecified site: Secondary | ICD-10-CM | POA: Diagnosis not present

## 2016-11-09 DIAGNOSIS — M6281 Muscle weakness (generalized): Secondary | ICD-10-CM | POA: Diagnosis not present

## 2016-11-09 DIAGNOSIS — R1310 Dysphagia, unspecified: Secondary | ICD-10-CM | POA: Diagnosis not present

## 2016-11-09 DIAGNOSIS — I609 Nontraumatic subarachnoid hemorrhage, unspecified: Secondary | ICD-10-CM | POA: Diagnosis not present

## 2016-11-09 DIAGNOSIS — R296 Repeated falls: Secondary | ICD-10-CM | POA: Diagnosis not present

## 2016-11-09 DIAGNOSIS — S066X0D Traumatic subarachnoid hemorrhage without loss of consciousness, subsequent encounter: Secondary | ICD-10-CM | POA: Diagnosis not present

## 2016-11-09 DIAGNOSIS — R262 Difficulty in walking, not elsewhere classified: Secondary | ICD-10-CM | POA: Diagnosis not present

## 2016-11-09 DIAGNOSIS — M25532 Pain in left wrist: Secondary | ICD-10-CM | POA: Diagnosis not present

## 2016-11-09 DIAGNOSIS — S22019D Unspecified fracture of first thoracic vertebra, subsequent encounter for fracture with routine healing: Secondary | ICD-10-CM | POA: Diagnosis not present

## 2016-11-09 DIAGNOSIS — S2222XD Fracture of body of sternum, subsequent encounter for fracture with routine healing: Secondary | ICD-10-CM | POA: Diagnosis not present

## 2016-11-09 DIAGNOSIS — W19XXXD Unspecified fall, subsequent encounter: Secondary | ICD-10-CM | POA: Diagnosis not present

## 2016-11-22 ENCOUNTER — Other Ambulatory Visit: Payer: Self-pay | Admitting: Internal Medicine

## 2016-11-22 DIAGNOSIS — R1312 Dysphagia, oropharyngeal phase: Secondary | ICD-10-CM

## 2016-11-28 DIAGNOSIS — I2510 Atherosclerotic heart disease of native coronary artery without angina pectoris: Secondary | ICD-10-CM | POA: Diagnosis not present

## 2016-11-30 ENCOUNTER — Other Ambulatory Visit: Payer: Self-pay

## 2016-11-30 ENCOUNTER — Telehealth: Payer: Self-pay

## 2016-11-30 NOTE — Telephone Encounter (Signed)
Pt called in with swelling and redness of hand- says that they "gave me some medicine for my hand that worked in [employer]". I called and spoke to [place] who said ," patient was only given Augmentin for 10 days and then xray was repeated and showed arthritis." Patient's son, Jonni Sanger, was told to ice hand over weekend and take Advil. If no better, call Monday morning to schedule appt with Dr Ronnald Ramp

## 2016-12-02 ENCOUNTER — Ambulatory Visit
Admission: EM | Admit: 2016-12-02 | Discharge: 2016-12-02 | Disposition: A | Discharge status: Home / Self Care | Payer: Medicare Other | Attending: Family Medicine | Admitting: Family Medicine

## 2016-12-02 ENCOUNTER — Encounter: Payer: Self-pay | Admitting: Gynecology

## 2016-12-02 DIAGNOSIS — L03114 Cellulitis of left upper limb: Secondary | ICD-10-CM | POA: Diagnosis not present

## 2016-12-02 DIAGNOSIS — I160 Hypertensive urgency: Secondary | ICD-10-CM

## 2016-12-02 MED ORDER — AMLODIPINE BESYLATE 2.5 MG PO TABS: 2.5000 mg | ORAL_TABLET | Freq: Every day | ORAL | 0 refills | Status: DC

## 2016-12-02 MED ORDER — DOXYCYCLINE HYCLATE 100 MG PO TABS: 100.0000 mg | ORAL_TABLET | Freq: Two times a day (BID) | ORAL | 0 refills | Status: DC

## 2016-12-02 NOTE — Discharge Instructions (Signed)
Antibiotic as prescribed.  If she worsens, straight to ED.  Take care  Dr. Lacinda Axon

## 2016-12-02 NOTE — ED Triage Notes (Signed)
Patient was seen x 1 month ago a [facility] for a fall. Per caregiver cellulitis in left hand where IV was place. Patient was given antibiotic which is finished and however swelling and pain is still there.

## 2016-12-02 NOTE — ED Provider Notes (Signed)
[facility]    CSN: [id number] Arrival date & time: 12/02/16  1435     History   Chief Complaint Chief Complaint  Patient presents with  . Hand Problem   HPI 83 year old female presents for evaluation of the above.  Her family members state that she was recently admitted to [facility] after she suffered a fall and was found to have a subarachnoid hemorrhage. She was recently discharged to a rehabilitation facility. While in the rehabilitation facility, she was found to have a cellulitis at a prior IV site on her left hand. Family members state that she was treated with antibiotic and had improvement but did not have complete resolution. They are unsure of what antibiotic that she was given. There is a thought that it was Keflex. They state that she continues to have redness and swelling of the dorsum of the hand extending to the wrist. No associated fevers or chills. It is quite painful. No other associated symptoms. No other complaints or concerns at this time.  Past Medical History:  Diagnosis Date  . Diabetes mellitus without complication ([place])   . Hyperlipidemia   . Hypertension   . Thyroid disease    Patient Active Problem List   Diagnosis Date Noted  . Multiple fractures 09/20/2016  . Frequent falls 09/20/2016  . Recurrent cystitis 09/20/2016  . Osteoporosis 11/08/2015  . Essential hypertension 11/08/2015   Past Surgical History:  Procedure Laterality Date  . ANKLE SURGERY    . HIP FRACTURE SURGERY    . WRIST SURGERY Right    OB History    No data available     Home Medications    Prior to Admission medications   Medication Sig Start Date End Date Taking? Authorizing Provider  aspirin 81 MG chewable tablet Chew 81 mg by mouth daily.   Yes Historical Provider, MD  cephALEXin (KEFLEX) 500 MG capsule Take 1 capsule (500 mg total) by mouth 3 (three) times daily. 10/24/16  Yes Juline Patch, MD  Cranberry 200 MG CAPS Take 1 capsule by mouth daily.   Yes  Historical Provider, MD  levothyroxine (SYNTHROID, LEVOTHROID) 50 MCG tablet Take 1 tablet (50 mcg total) by mouth daily before breakfast. 01/10/16  Yes Juline Patch, MD  lovastatin (MEVACOR) 40 MG tablet Take 1 tablet (40 mg total) by mouth daily. 10/11/15  Yes Juline Patch, MD  metFORMIN (GLUCOPHAGE) 500 MG tablet TAKE ONE TABLET BY MOUTH ONCE DAILY WITH BREAKFAT 08/07/16  Yes Juline Patch, MD  metoprolol succinate (TOPROL-XL) 25 MG 24 hr tablet Take 25 mg by mouth daily. Dr Ubaldo Glassing   Yes Historical Provider, MD  nystatin cream (MYCOSTATIN) Apply 1 application topically 2 (two) times daily. 11/01/16  Yes Juline Patch, MD  amLODipine (NORVASC) 2.5 MG tablet Take 1 tablet (2.5 mg total) by mouth daily. 12/02/16   Coral Spikes, DO  doxycycline (VIBRA-TABS) 100 MG tablet Take 1 tablet (100 mg total) by mouth 2 (two) times daily with a meal. 12/02/16   Coral Spikes, DO    Family History History reviewed. No pertinent family history.  Social History Social History  Substance Use Topics  . Smoking status: Never Smoker  . Smokeless tobacco: Never Used  . Alcohol use No     Allergies   Sulfa antibiotics   Review of Systems Review of Systems  Skin:       Swelling and redness of the left hand.  All other systems reviewed and are negative.  Physical Exam Triage Vital Signs ED Triage Vitals  Enc Vitals Group     BP 12/02/16 1533 (!) 191/65     Pulse Rate 12/02/16 1533 71     Resp 12/02/16 1533 16     Temp 12/02/16 1533 99.1 F (37.3 C)     Temp Source 12/02/16 1533 Oral     SpO2 12/02/16 1533 98 %     Weight 12/02/16 1536 100 lb (45.4 kg)     Height --      Head Circumference --      Peak Flow --      Pain Score 12/02/16 1537 5     Pain Loc --      Pain Edu? --      Excl. in [place]? --    Updated Vital Signs BP (!) 191/65 (BP Location: Right Arm)   Pulse 71   Temp 99.1 F (37.3 C) (Oral)   Resp 16   Wt 100 lb (45.4 kg)   SpO2 98%   BMI 20.20 kg/m   Physical Exam    Constitutional: She appears well-developed. No distress.  HENT:  Head: Normocephalic and atraumatic.  Eyes: Conjunctivae are normal.  Neck: Normal range of motion.  Cardiovascular: Normal rate and regular rhythm.   Pulmonary/Chest: Effort normal and breath sounds normal.  Abdominal: Soft. She exhibits no distension.  Neurological: She is alert.  Skin:  Left hand - significant erythema and warmth as well as mild swelling of the dorsum of the left hand and wrist.  Psychiatric:  Flat affect.  Vitals reviewed.   UC Treatments / Results  Labs (all labs ordered are listed, but only abnormal results are displayed) Labs Reviewed - No data to display  EKG  EKG Interpretation None       Radiology No results found.  Procedures Procedures (including critical care time)  Medications Ordered in UC Medications - No data to display   Initial Impression / Assessment and Plan / UC Course  I have reviewed the triage vital signs and the nursing notes.  Pertinent labs & imaging results that were available during my care of the patient were reviewed by me and considered in my medical decision making (see chart for details).   83 year old female presents with signs and symptoms of cellulitis. Treating with doxycycline. I informed the family members that if she fails to improve or worsens, she is going to need to be admitted for IV antibiotics. Additionally, her blood pressures markedly elevated today. She is well-appearing and there are no signs of end organ damage/hypertensive urgency. Adding Norvasc today.  Final Clinical Impressions(s) / UC Diagnoses   Final diagnoses:  Cellulitis of left upper extremity  Hypertensive urgency    New Prescriptions Discharge Medication List as of 12/02/2016  4:15 PM    START taking these medications   Details  amLODipine (NORVASC) 2.5 MG tablet Take 1 tablet (2.5 mg total) by mouth daily., Starting Sun 12/02/2016, Normal    doxycycline  (VIBRA-TABS) 100 MG tablet Take 1 tablet (100 mg total) by mouth 2 (two) times daily with a meal., Starting Sun 12/02/2016, Normal         Coral Spikes, DO 12/02/16 1632

## 2016-12-03 DIAGNOSIS — I2510 Atherosclerotic heart disease of native coronary artery without angina pectoris: Secondary | ICD-10-CM | POA: Diagnosis not present

## 2016-12-03 DIAGNOSIS — I10 Essential (primary) hypertension: Secondary | ICD-10-CM | POA: Diagnosis not present

## 2016-12-03 DIAGNOSIS — S22019S Unspecified fracture of first thoracic vertebra, sequela: Secondary | ICD-10-CM | POA: Diagnosis not present

## 2016-12-03 DIAGNOSIS — R262 Difficulty in walking, not elsewhere classified: Secondary | ICD-10-CM | POA: Diagnosis not present

## 2016-12-03 DIAGNOSIS — S066X0S Traumatic subarachnoid hemorrhage without loss of consciousness, sequela: Secondary | ICD-10-CM | POA: Diagnosis not present

## 2016-12-03 DIAGNOSIS — E119 Type 2 diabetes mellitus without complications: Secondary | ICD-10-CM | POA: Diagnosis not present

## 2016-12-03 DIAGNOSIS — M6281 Muscle weakness (generalized): Secondary | ICD-10-CM | POA: Diagnosis not present

## 2016-12-04 ENCOUNTER — Encounter: Payer: Self-pay | Admitting: Family Medicine

## 2016-12-04 ENCOUNTER — Ambulatory Visit (INDEPENDENT_AMBULATORY_CARE_PROVIDER_SITE_OTHER): Payer: Medicare Other | Admitting: Family Medicine

## 2016-12-04 VITALS — BP 110/62 | HR 72 | Temp 98.2°F | Ht 59.0 in | Wt 106.0 lb

## 2016-12-04 DIAGNOSIS — M79642 Pain in left hand: Secondary | ICD-10-CM | POA: Diagnosis not present

## 2016-12-04 DIAGNOSIS — Z7982 Long term (current) use of aspirin: Secondary | ICD-10-CM | POA: Diagnosis not present

## 2016-12-04 DIAGNOSIS — L03114 Cellulitis of left upper limb: Secondary | ICD-10-CM | POA: Diagnosis not present

## 2016-12-04 DIAGNOSIS — R404 Transient alteration of awareness: Secondary | ICD-10-CM

## 2016-12-04 DIAGNOSIS — L539 Erythematous condition, unspecified: Secondary | ICD-10-CM | POA: Diagnosis not present

## 2016-12-04 DIAGNOSIS — M25432 Effusion, left wrist: Secondary | ICD-10-CM | POA: Diagnosis not present

## 2016-12-04 DIAGNOSIS — M79641 Pain in right hand: Secondary | ICD-10-CM | POA: Diagnosis not present

## 2016-12-04 DIAGNOSIS — I10 Essential (primary) hypertension: Secondary | ICD-10-CM | POA: Diagnosis not present

## 2016-12-04 DIAGNOSIS — I110 Hypertensive heart disease with heart failure: Secondary | ICD-10-CM | POA: Diagnosis not present

## 2016-12-04 DIAGNOSIS — M79602 Pain in left arm: Secondary | ICD-10-CM | POA: Diagnosis not present

## 2016-12-04 DIAGNOSIS — Z8679 Personal history of other diseases of the circulatory system: Secondary | ICD-10-CM

## 2016-12-04 DIAGNOSIS — R509 Fever, unspecified: Secondary | ICD-10-CM | POA: Diagnosis not present

## 2016-12-04 DIAGNOSIS — Z7984 Long term (current) use of oral hypoglycemic drugs: Secondary | ICD-10-CM | POA: Diagnosis not present

## 2016-12-04 DIAGNOSIS — E039 Hypothyroidism, unspecified: Secondary | ICD-10-CM | POA: Diagnosis not present

## 2016-12-04 DIAGNOSIS — E785 Hyperlipidemia, unspecified: Secondary | ICD-10-CM | POA: Diagnosis not present

## 2016-12-04 DIAGNOSIS — E119 Type 2 diabetes mellitus without complications: Secondary | ICD-10-CM | POA: Diagnosis not present

## 2016-12-04 DIAGNOSIS — I509 Heart failure, unspecified: Secondary | ICD-10-CM | POA: Diagnosis not present

## 2016-12-04 DIAGNOSIS — M25532 Pain in left wrist: Secondary | ICD-10-CM | POA: Diagnosis not present

## 2016-12-04 DIAGNOSIS — M7989 Other specified soft tissue disorders: Secondary | ICD-10-CM | POA: Diagnosis not present

## 2016-12-04 DIAGNOSIS — R262 Difficulty in walking, not elsewhere classified: Secondary | ICD-10-CM | POA: Diagnosis not present

## 2016-12-04 NOTE — Progress Notes (Signed)
Name: Lorraine Fischer   MRN: [id number]    DOB: 05-15-1923   Date:12/04/2016       Progress Note  Subjective  Chief Complaint  Chief Complaint  Patient presents with  . Follow-up    urgent care- added Amlodipine 2.5mg  due to B/P and started Doxy- has had a slight decrease in swelling of L) hand and increased shaking bilateral as well as increased confusion.    Neurologic Problem  The patient's primary symptoms include an altered mental status. The patient's pertinent negatives include no clumsiness, focal sensory loss, focal weakness, loss of balance, memory loss, near-syncope, slurred speech, syncope, visual change or weakness. This is a new problem. The current episode started in the past 7 days. The neurological problem developed gradually. The problem has been waxing and waning since onset. There was upper extremity, left-sided and right-sided focality noted. Associated symptoms include bladder incontinence, bowel incontinence, confusion, dizziness, headaches and light-headedness. Pertinent negatives include no abdominal pain, auditory change, back pain, chest pain, fever, nausea, neck pain, palpitations, shortness of breath, vertigo or vomiting. (Unable to ambulate on own) Treatments tried: doxycycline/ amlodipine. The treatment provided no relief (gradual deterioration). Her past medical history is significant for head trauma. There is no history of dementia. (Subarachnoid)    No problem-specific Assessment & Plan notes found for this encounter.   Past Medical History:  Diagnosis Date  . Diabetes mellitus without complication ([place])   . Hyperlipidemia   . Hypertension   . Thyroid disease     Past Surgical History:  Procedure Laterality Date  . ANKLE SURGERY    . HIP FRACTURE SURGERY    . WRIST SURGERY Right     No family history on file.  Social History   Social History  . Marital status: Widowed    Spouse name: N/A  . Number of children: N/A  . Years of education: N/A    Occupational History  . Not on file.   Social History Main Topics  . Smoking status: Never Smoker  . Smokeless tobacco: Never Used  . Alcohol use No  . Drug use: No  . Sexual activity: Not Currently   Other Topics Concern  . Not on file   Social History Narrative  . No narrative on file    Allergies  Allergen Reactions  . Sulfa Antibiotics Shortness Of Breath    Outpatient Medications Prior to Visit  Medication Sig Dispense Refill  . amLODipine (NORVASC) 2.5 MG tablet Take 1 tablet (2.5 mg total) by mouth daily. 30 tablet 0  . aspirin 81 MG chewable tablet Chew 81 mg by mouth daily.    . Cranberry 200 MG CAPS Take 1 capsule by mouth daily.    Marland Kitchen doxycycline (VIBRA-TABS) 100 MG tablet Take 1 tablet (100 mg total) by mouth 2 (two) times daily with a meal. 20 tablet 0  . levothyroxine (SYNTHROID, LEVOTHROID) 50 MCG tablet Take 1 tablet (50 mcg total) by mouth daily before breakfast. 90 tablet 1  . lovastatin (MEVACOR) 40 MG tablet Take 1 tablet (40 mg total) by mouth daily. 90 tablet 1  . metFORMIN (GLUCOPHAGE) 500 MG tablet TAKE ONE TABLET BY MOUTH ONCE DAILY WITH BREAKFAT 90 tablet 0  . metoprolol succinate (TOPROL-XL) 25 MG 24 hr tablet Take 25 mg by mouth daily. Dr Ubaldo Glassing    . cephALEXin (KEFLEX) 500 MG capsule Take 1 capsule (500 mg total) by mouth 3 (three) times daily. 10 capsule 0  . nystatin cream (MYCOSTATIN) Apply 1 application  topically 2 (two) times daily. (Patient not taking: Reported on 12/04/2016) 30 g 0   No facility-administered medications prior to visit.     Review of Systems  Constitutional: Negative for chills, fever, malaise/fatigue and weight loss.  HENT: Negative for ear discharge, ear pain and sore throat.   Eyes: Negative for blurred vision.  Respiratory: Negative for cough, sputum production, shortness of breath and wheezing.   Cardiovascular: Negative for chest pain, palpitations, leg swelling and near-syncope.  Gastrointestinal: Positive for  bowel incontinence. Negative for abdominal pain, blood in stool, constipation, diarrhea, heartburn, melena, nausea and vomiting.  Genitourinary: Positive for bladder incontinence. Negative for dysuria, frequency, hematuria and urgency.  Musculoskeletal: Negative for back pain, joint pain, myalgias and neck pain.  Skin: Negative for rash.  Neurological: Positive for dizziness, tremors, light-headedness and headaches. Negative for vertigo, tingling, sensory change, focal weakness, syncope, weakness and loss of balance.  Endo/Heme/Allergies: Negative for environmental allergies and polydipsia. Does not bruise/bleed easily.  Psychiatric/Behavioral: Positive for confusion. Negative for depression, memory loss and suicidal ideas. The patient is not nervous/anxious and does not have insomnia.      Objective  Vitals:   12/04/16 1500  BP: 110/62  Pulse: 72  Temp: 98.2 F (36.8 C)  TempSrc: Oral  Weight: 106 lb (48.1 kg)  Height: 4\' 11"  (1.499 m)    Physical Exam  Constitutional: She is well-developed, well-nourished, and in no distress. No distress.  Oriented x2/ dishelveled  HENT:  Head: Normocephalic and atraumatic.  Right Ear: External ear normal.  Left Ear: External ear normal.  Nose: Nose normal.  Mouth/Throat: Oropharynx is clear and moist.  Eyes: Conjunctivae and EOM are normal. Pupils are equal, round, and reactive to light. Right eye exhibits no discharge. Left eye exhibits no discharge.  Neck: Normal range of motion. Neck supple. No JVD present. No thyromegaly present.  Cardiovascular: Normal rate, regular rhythm, normal heart sounds and intact distal pulses.  Exam reveals no gallop and no friction rub.   No murmur heard. Pulmonary/Chest: Effort normal and breath sounds normal.  Abdominal: Soft. Bowel sounds are normal. She exhibits no mass. There is no tenderness. There is no guarding.  Musculoskeletal: Normal range of motion. She exhibits no edema.  Lymphadenopathy:    She  has no cervical adenopathy.  Neurological: She is alert. She has normal reflexes. No cranial nerve deficit.  Unable to ambulate without assistance  Skin: Skin is warm and dry. She is not diaphoretic.  Psychiatric: Mood and affect normal.  Nursing note and vitals reviewed.     Assessment & Plan  Problem List Items Addressed This Visit    None    Visit Diagnoses    Transient alteration of awareness    -  Primary   ? sepsis   Cellulitis of left upper extremity       iv site   Hx of subarachnoid hemorrhage       ?fall related/       No orders of the defined types were placed in this encounter.     Dr. [facility]  12/04/16

## 2016-12-06 ENCOUNTER — Ambulatory Visit: Payer: Medicare Other

## 2016-12-07 ENCOUNTER — Telehealth: Payer: Self-pay | Admitting: Family Medicine

## 2016-12-07 NOTE — Telephone Encounter (Signed)
[facility] Schedule pt a hospital f/u with Dr. Ronnald Ramp on Friday 12/14/16 @ 11am. They advised pt should be discharged either today or tomorrow 12/07/16 or 12/08/16. Pt was treated for cellulitis and they will fax her records to our office after discharge. Thanks TNP

## 2016-12-10 DIAGNOSIS — M6281 Muscle weakness (generalized): Secondary | ICD-10-CM | POA: Diagnosis not present

## 2016-12-10 DIAGNOSIS — I10 Essential (primary) hypertension: Secondary | ICD-10-CM | POA: Diagnosis not present

## 2016-12-10 DIAGNOSIS — R262 Difficulty in walking, not elsewhere classified: Secondary | ICD-10-CM | POA: Diagnosis not present

## 2016-12-10 DIAGNOSIS — I2510 Atherosclerotic heart disease of native coronary artery without angina pectoris: Secondary | ICD-10-CM | POA: Diagnosis not present

## 2016-12-10 DIAGNOSIS — S22019S Unspecified fracture of first thoracic vertebra, sequela: Secondary | ICD-10-CM | POA: Diagnosis not present

## 2016-12-10 DIAGNOSIS — E119 Type 2 diabetes mellitus without complications: Secondary | ICD-10-CM | POA: Diagnosis not present

## 2016-12-10 DIAGNOSIS — S066X0S Traumatic subarachnoid hemorrhage without loss of consciousness, sequela: Secondary | ICD-10-CM | POA: Diagnosis not present

## 2016-12-12 DIAGNOSIS — S066X0S Traumatic subarachnoid hemorrhage without loss of consciousness, sequela: Secondary | ICD-10-CM | POA: Diagnosis not present

## 2016-12-12 DIAGNOSIS — M6281 Muscle weakness (generalized): Secondary | ICD-10-CM | POA: Diagnosis not present

## 2016-12-12 DIAGNOSIS — E119 Type 2 diabetes mellitus without complications: Secondary | ICD-10-CM | POA: Diagnosis not present

## 2016-12-12 DIAGNOSIS — I10 Essential (primary) hypertension: Secondary | ICD-10-CM | POA: Diagnosis not present

## 2016-12-12 DIAGNOSIS — I2510 Atherosclerotic heart disease of native coronary artery without angina pectoris: Secondary | ICD-10-CM | POA: Diagnosis not present

## 2016-12-12 DIAGNOSIS — S22019S Unspecified fracture of first thoracic vertebra, sequela: Secondary | ICD-10-CM | POA: Diagnosis not present

## 2016-12-12 DIAGNOSIS — R262 Difficulty in walking, not elsewhere classified: Secondary | ICD-10-CM | POA: Diagnosis not present

## 2016-12-14 ENCOUNTER — Ambulatory Visit (INDEPENDENT_AMBULATORY_CARE_PROVIDER_SITE_OTHER): Payer: Medicare Other | Admitting: Family Medicine

## 2016-12-14 VITALS — BP 158/70 | HR 72 | Ht 59.0 in | Wt 106.0 lb

## 2016-12-14 DIAGNOSIS — E785 Hyperlipidemia, unspecified: Secondary | ICD-10-CM

## 2016-12-14 DIAGNOSIS — Z09 Encounter for follow-up examination after completed treatment for conditions other than malignant neoplasm: Secondary | ICD-10-CM

## 2016-12-14 DIAGNOSIS — L84 Corns and callosities: Secondary | ICD-10-CM | POA: Diagnosis not present

## 2016-12-14 DIAGNOSIS — E039 Hypothyroidism, unspecified: Secondary | ICD-10-CM | POA: Diagnosis not present

## 2016-12-14 DIAGNOSIS — I10 Essential (primary) hypertension: Secondary | ICD-10-CM | POA: Diagnosis not present

## 2016-12-14 DIAGNOSIS — E119 Type 2 diabetes mellitus without complications: Secondary | ICD-10-CM | POA: Diagnosis not present

## 2016-12-14 MED ORDER — METFORMIN HCL 500 MG PO TABS: ORAL_TABLET | ORAL | 2 refills | Status: DC

## 2016-12-14 MED ORDER — METOPROLOL SUCCINATE ER 50 MG PO TB24: 50.0000 mg | ORAL_TABLET | Freq: Every day | ORAL | 2 refills | Status: DC

## 2016-12-14 MED ORDER — LISINOPRIL 5 MG PO TABS: 5.0000 mg | ORAL_TABLET | Freq: Every day | ORAL | 2 refills | Status: DC

## 2016-12-14 MED ORDER — LOVASTATIN 40 MG PO TABS: 40.0000 mg | ORAL_TABLET | Freq: Every day | ORAL | 1 refills | Status: DC

## 2016-12-14 MED ORDER — AMLODIPINE BESYLATE 2.5 MG PO TABS: 2.5000 mg | ORAL_TABLET | Freq: Every day | ORAL | 2 refills | Status: DC

## 2016-12-14 MED ORDER — LEVOTHYROXINE SODIUM 50 MCG PO TABS: 50.0000 ug | ORAL_TABLET | Freq: Every day | ORAL | 1 refills | Status: DC

## 2016-12-14 NOTE — Progress Notes (Signed)
Name: Lorraine Fischer   MRN: [id number]    DOB: 1923-05-21   Date:12/14/2016       Progress Note  Subjective  Chief Complaint  Chief Complaint  Patient presents with  . Follow-up    d/c from hosp on 12/08/2016 cellulitis- finishing up doxy. added b/p med to regimen    Patient presents for transfer of care.   Hand Pain   The incident occurred more than 1 week ago. There was no injury mechanism (cellulitis). The pain is present in the left hand. The quality of the pain is described as aching. The patient is experiencing no pain. Pertinent negatives include no chest pain or tingling. Treatments tried: antibiotics. The treatment provided significant relief.  Hypertension  This is a chronic problem. The current episode started 1 to 4 weeks ago. The problem has been waxing and waning since onset. The problem is controlled. Pertinent negatives include no anxiety, blurred vision, chest pain, headaches, malaise/fatigue, neck pain, orthopnea, palpitations, peripheral edema, PND, shortness of breath or sweats. There are no associated agents to hypertension. There are no known risk factors for coronary artery disease. Past treatments include ACE inhibitors, calcium channel blockers and beta blockers. The current treatment provides mild improvement. There are no compliance problems.  There is no history of angina, kidney disease, CAD/MI, CVA, heart failure, left ventricular hypertrophy, PVD or retinopathy. There is no history of chronic renal disease, a hypertension causing med or renovascular disease.    No problem-specific Assessment & Plan notes found for this encounter.   Past Medical History:  Diagnosis Date  . Diabetes mellitus without complication ([place])   . Hyperlipidemia   . Hypertension   . Thyroid disease     Past Surgical History:  Procedure Laterality Date  . ANKLE SURGERY    . HIP FRACTURE SURGERY    . WRIST SURGERY Right     No family history on file.  Social History    Social History  . Marital status: Widowed    Spouse name: N/A  . Number of children: N/A  . Years of education: N/A   Occupational History  . Not on file.   Social History Main Topics  . Smoking status: Never Smoker  . Smokeless tobacco: Never Used  . Alcohol use No  . Drug use: No  . Sexual activity: Not Currently   Other Topics Concern  . Not on file   Social History Narrative  . No narrative on file    Allergies  Allergen Reactions  . Sulfa Antibiotics Shortness Of Breath    Outpatient Medications Prior to Visit  Medication Sig Dispense Refill  . aspirin 81 MG chewable tablet Chew 81 mg by mouth daily.    Marland Kitchen doxycycline (VIBRA-TABS) 100 MG tablet Take 1 tablet (100 mg total) by mouth 2 (two) times daily with a meal. 20 tablet 0  . nystatin cream (MYCOSTATIN) Apply 1 application topically 2 (two) times daily. 30 g 0  . amLODipine (NORVASC) 2.5 MG tablet Take 1 tablet (2.5 mg total) by mouth daily. 30 tablet 0  . levothyroxine (SYNTHROID, LEVOTHROID) 50 MCG tablet Take 1 tablet (50 mcg total) by mouth daily before breakfast. 90 tablet 1  . lovastatin (MEVACOR) 40 MG tablet Take 1 tablet (40 mg total) by mouth daily. 90 tablet 1  . metFORMIN (GLUCOPHAGE) 500 MG tablet TAKE ONE TABLET BY MOUTH ONCE DAILY WITH BREAKFAT 90 tablet 0  . Cranberry 200 MG CAPS Take 1 capsule by mouth daily.    Marland Kitchen  metoprolol succinate (TOPROL-XL) 25 MG 24 hr tablet Take 25 mg by mouth daily. Dr Ubaldo Glassing     No facility-administered medications prior to visit.     Review of Systems  Constitutional: Negative for chills, fever, malaise/fatigue and weight loss.  HENT: Negative for ear discharge, ear pain and sore throat.   Eyes: Negative for blurred vision.  Respiratory: Negative for cough, sputum production, shortness of breath and wheezing.   Cardiovascular: Negative for chest pain, palpitations, orthopnea, leg swelling and PND.  Gastrointestinal: Negative for abdominal pain, blood in stool,  constipation, diarrhea, heartburn, melena and nausea.  Genitourinary: Negative for dysuria, frequency, hematuria and urgency.  Musculoskeletal: Negative for back pain, joint pain, myalgias and neck pain.  Skin: Negative for rash.  Neurological: Negative for dizziness, tingling, sensory change, focal weakness and headaches.  Endo/Heme/Allergies: Negative for environmental allergies and polydipsia. Does not bruise/bleed easily.  Psychiatric/Behavioral: Negative for depression and suicidal ideas. The patient is not nervous/anxious and does not have insomnia.      Objective  Vitals:   12/14/16 1116  BP: (!) 158/70  Pulse: 72  Weight: 106 lb (48.1 kg)  Height: 4\' 11"  (1.499 m)    Physical Exam  Constitutional: She is well-developed, well-nourished, and in no distress. No distress.  HENT:  Head: Normocephalic and atraumatic.  Right Ear: External ear normal.  Left Ear: External ear normal.  Nose: Nose normal.  Mouth/Throat: Oropharynx is clear and moist.  Eyes: Conjunctivae and EOM are normal. Pupils are equal, round, and reactive to light. Right eye exhibits no discharge. Left eye exhibits no discharge.  Neck: Normal range of motion. Neck supple. No JVD present. No thyromegaly present.  Cardiovascular: Normal rate, regular rhythm, normal heart sounds and intact distal pulses.  Exam reveals no gallop and no friction rub.   No murmur heard. Pulmonary/Chest: Effort normal and breath sounds normal. She has no wheezes. She has no rales.  Abdominal: Soft. Bowel sounds are normal. She exhibits no mass. There is no tenderness. There is no guarding.  Musculoskeletal: Normal range of motion. She exhibits no edema.       Left foot: There is tenderness and deformity.  Corn left great toe  Lymphadenopathy:    She has no cervical adenopathy.  Neurological: She is alert. She has normal reflexes.  Skin: Skin is warm and dry. She is not diaphoretic.  Psychiatric: Mood and affect normal.  Nursing  note and vitals reviewed.     Assessment & Plan  Problem List Items Addressed This Visit      Cardiovascular and Mediastinum   Essential hypertension   Relevant Medications   lisinopril (PRINIVIL,ZESTRIL) 5 MG tablet   amLODipine (NORVASC) 2.5 MG tablet   metoprolol succinate (TOPROL-XL) 50 MG 24 hr tablet   lovastatin (MEVACOR) 40 MG tablet    Other Visit Diagnoses    Hospital discharge follow-up    -  Primary   Hyperlipidemia, unspecified hyperlipidemia type       Relevant Medications   lisinopril (PRINIVIL,ZESTRIL) 5 MG tablet   amLODipine (NORVASC) 2.5 MG tablet   metoprolol succinate (TOPROL-XL) 50 MG 24 hr tablet   lovastatin (MEVACOR) 40 MG tablet   Type 2 diabetes mellitus without complication, without long-term current use of insulin (HCC)       Relevant Medications   lisinopril (PRINIVIL,ZESTRIL) 5 MG tablet   lovastatin (MEVACOR) 40 MG tablet   metFORMIN (GLUCOPHAGE) 500 MG tablet   Corn of toe       Relevant Orders  Ambulatory referral to Podiatry   Hypothyroidism, unspecified type       Relevant Medications   metoprolol succinate (TOPROL-XL) 50 MG 24 hr tablet   levothyroxine (SYNTHROID, LEVOTHROID) 50 MCG tablet      Meds ordered this encounter  Medications  . lisinopril (PRINIVIL,ZESTRIL) 5 MG tablet    Sig: Take 1 tablet (5 mg total) by mouth daily.    Dispense:  90 tablet    Refill:  2  . amLODipine (NORVASC) 2.5 MG tablet    Sig: Take 1 tablet (2.5 mg total) by mouth daily.    Dispense:  90 tablet    Refill:  2  . metoprolol succinate (TOPROL-XL) 50 MG 24 hr tablet    Sig: Take 1 tablet (50 mg total) by mouth daily. Take with or immediately following a meal.    Dispense:  90 tablet    Refill:  2  . lovastatin (MEVACOR) 40 MG tablet    Sig: Take 1 tablet (40 mg total) by mouth daily.    Dispense:  90 tablet    Refill:  1  . levothyroxine (SYNTHROID, LEVOTHROID) 50 MCG tablet    Sig: Take 1 tablet (50 mcg total) by mouth daily before  breakfast.    Dispense:  90 tablet    Refill:  1  . metFORMIN (GLUCOPHAGE) 500 MG tablet    Sig: TAKE ONE TABLET BY MOUTH ONCE DAILY WITH BREAKFAT    Dispense:  90 tablet    Refill:  2      Dr. Kioni Stahl [facility]  12/14/16

## 2016-12-14 NOTE — Patient Instructions (Signed)
Corns and Calluses Corns are small areas of thickened skin that occur on the top, sides, or tip of a toe. They contain a cone-shaped core with a point that can press on a nerve below. This causes pain. Calluses are areas of thickened skin that can occur anywhere on the body including hands, fingers, palms, soles of the feet, and heels.Calluses are usually larger than corns. What are the causes? Corns and calluses are caused by rubbing (friction) or pressure, such as from shoes that are too tight or do not fit properly. What increases the risk? Corns are more likely to develop in people who have toe deformities, such as hammer toes. Since calluses can occur with friction to any area of the skin, calluses are more likely to develop in people who:  Work with their hands.  Wear shoes that fit poorly, shoes that are too tight, or shoes that are high-heeled.  Have toes deformities. What are the signs or symptoms? Symptoms of a corn or callus include:  A hard growth on the skin.  Pain or tenderness under the skin.  Redness and swelling.  Increased discomfort while wearing tight-fitting shoes. How is this diagnosed? Corns and calluses may be diagnosed with a medical history and physical exam. How is this treated? Corns and calluses may be treated with:  Removing the cause of the friction or pressure. This may include:  Changing your shoes.  Wearing shoe inserts (orthotics) or other protective layers in your shoes, such as a corn pad.  Wearing gloves.  Medicines to help soften skin in the hardened, thickened areas.  Reducing the size of the corn or callus by removing the dead layers of skin.  Antibiotic medicines to treat infection.  Surgery, if a toe deformity is the cause. Follow these instructions at home:  Take medicines only as directed by your health care provider.  If you were prescribed an antibiotic, finish all of it even if you start to feel better.  Wear shoes that  fit well. Avoid wearing high-heeled shoes and shoes that are too tight or too loose.  Wear any padding, protective layers, gloves, or orthotics as directed by your health care provider.  Soak your hands or feet and then use a file or pumice stone to soften your corn or callus. Do this as directed by your health care provider.  Check your corn or callus every day for signs of infection. Watch for:  Redness, swelling, or pain.  Fluid, blood, or pus. Contact a health care provider if:  Your symptoms do not improve with treatment.  You have increased redness, swelling, or pain at the site of your corn or callus.  You have fluid, blood, or pus coming from your corn or callus.  You have new symptoms. This information is not intended to replace advice given to you by your health care provider. Make sure you discuss any questions you have with your health care provider. Document Released: 05/12/2004 Document Revised: 02/24/2016 Document Reviewed: 08/02/2014 Elsevier Interactive Patient Education  2017 [employer]  

## 2016-12-17 DIAGNOSIS — S22019S Unspecified fracture of first thoracic vertebra, sequela: Secondary | ICD-10-CM | POA: Diagnosis not present

## 2016-12-17 DIAGNOSIS — M6281 Muscle weakness (generalized): Secondary | ICD-10-CM | POA: Diagnosis not present

## 2016-12-17 DIAGNOSIS — R262 Difficulty in walking, not elsewhere classified: Secondary | ICD-10-CM | POA: Diagnosis not present

## 2016-12-17 DIAGNOSIS — S066X0S Traumatic subarachnoid hemorrhage without loss of consciousness, sequela: Secondary | ICD-10-CM | POA: Diagnosis not present

## 2016-12-17 DIAGNOSIS — I10 Essential (primary) hypertension: Secondary | ICD-10-CM | POA: Diagnosis not present

## 2016-12-17 DIAGNOSIS — E119 Type 2 diabetes mellitus without complications: Secondary | ICD-10-CM | POA: Diagnosis not present

## 2016-12-17 DIAGNOSIS — I2510 Atherosclerotic heart disease of native coronary artery without angina pectoris: Secondary | ICD-10-CM | POA: Diagnosis not present

## 2016-12-18 DIAGNOSIS — R262 Difficulty in walking, not elsewhere classified: Secondary | ICD-10-CM | POA: Diagnosis not present

## 2016-12-18 DIAGNOSIS — I2510 Atherosclerotic heart disease of native coronary artery without angina pectoris: Secondary | ICD-10-CM | POA: Diagnosis not present

## 2016-12-18 DIAGNOSIS — S066X0S Traumatic subarachnoid hemorrhage without loss of consciousness, sequela: Secondary | ICD-10-CM | POA: Diagnosis not present

## 2016-12-18 DIAGNOSIS — S22019S Unspecified fracture of first thoracic vertebra, sequela: Secondary | ICD-10-CM | POA: Diagnosis not present

## 2016-12-18 DIAGNOSIS — I10 Essential (primary) hypertension: Secondary | ICD-10-CM | POA: Diagnosis not present

## 2016-12-18 DIAGNOSIS — E119 Type 2 diabetes mellitus without complications: Secondary | ICD-10-CM | POA: Diagnosis not present

## 2016-12-18 DIAGNOSIS — M6281 Muscle weakness (generalized): Secondary | ICD-10-CM | POA: Diagnosis not present

## 2016-12-19 DIAGNOSIS — B351 Tinea unguium: Secondary | ICD-10-CM | POA: Diagnosis not present

## 2016-12-19 DIAGNOSIS — E1151 Type 2 diabetes mellitus with diabetic peripheral angiopathy without gangrene: Secondary | ICD-10-CM | POA: Diagnosis not present

## 2016-12-19 DIAGNOSIS — D2372 Other benign neoplasm of skin of left lower limb, including hip: Secondary | ICD-10-CM | POA: Diagnosis not present

## 2016-12-20 DIAGNOSIS — R262 Difficulty in walking, not elsewhere classified: Secondary | ICD-10-CM | POA: Diagnosis not present

## 2016-12-20 DIAGNOSIS — I10 Essential (primary) hypertension: Secondary | ICD-10-CM | POA: Diagnosis not present

## 2016-12-20 DIAGNOSIS — S066X0S Traumatic subarachnoid hemorrhage without loss of consciousness, sequela: Secondary | ICD-10-CM | POA: Diagnosis not present

## 2016-12-20 DIAGNOSIS — I2510 Atherosclerotic heart disease of native coronary artery without angina pectoris: Secondary | ICD-10-CM | POA: Diagnosis not present

## 2016-12-20 DIAGNOSIS — E119 Type 2 diabetes mellitus without complications: Secondary | ICD-10-CM | POA: Diagnosis not present

## 2016-12-20 DIAGNOSIS — M6281 Muscle weakness (generalized): Secondary | ICD-10-CM | POA: Diagnosis not present

## 2016-12-20 DIAGNOSIS — S22019S Unspecified fracture of first thoracic vertebra, sequela: Secondary | ICD-10-CM | POA: Diagnosis not present

## 2016-12-21 DIAGNOSIS — M6281 Muscle weakness (generalized): Secondary | ICD-10-CM | POA: Diagnosis not present

## 2016-12-21 DIAGNOSIS — R262 Difficulty in walking, not elsewhere classified: Secondary | ICD-10-CM | POA: Diagnosis not present

## 2016-12-21 DIAGNOSIS — I2510 Atherosclerotic heart disease of native coronary artery without angina pectoris: Secondary | ICD-10-CM | POA: Diagnosis not present

## 2016-12-21 DIAGNOSIS — I10 Essential (primary) hypertension: Secondary | ICD-10-CM | POA: Diagnosis not present

## 2016-12-21 DIAGNOSIS — S22019S Unspecified fracture of first thoracic vertebra, sequela: Secondary | ICD-10-CM | POA: Diagnosis not present

## 2016-12-21 DIAGNOSIS — E119 Type 2 diabetes mellitus without complications: Secondary | ICD-10-CM | POA: Diagnosis not present

## 2016-12-21 DIAGNOSIS — S066X0S Traumatic subarachnoid hemorrhage without loss of consciousness, sequela: Secondary | ICD-10-CM | POA: Diagnosis not present

## 2016-12-24 ENCOUNTER — Telehealth: Payer: Self-pay

## 2016-12-24 DIAGNOSIS — I10 Essential (primary) hypertension: Secondary | ICD-10-CM | POA: Diagnosis not present

## 2016-12-24 DIAGNOSIS — E119 Type 2 diabetes mellitus without complications: Secondary | ICD-10-CM | POA: Diagnosis not present

## 2016-12-24 DIAGNOSIS — S22019S Unspecified fracture of first thoracic vertebra, sequela: Secondary | ICD-10-CM | POA: Diagnosis not present

## 2016-12-24 DIAGNOSIS — R262 Difficulty in walking, not elsewhere classified: Secondary | ICD-10-CM | POA: Diagnosis not present

## 2016-12-24 DIAGNOSIS — I2510 Atherosclerotic heart disease of native coronary artery without angina pectoris: Secondary | ICD-10-CM | POA: Diagnosis not present

## 2016-12-24 DIAGNOSIS — M6281 Muscle weakness (generalized): Secondary | ICD-10-CM | POA: Diagnosis not present

## 2016-12-24 DIAGNOSIS — S066X0S Traumatic subarachnoid hemorrhage without loss of consciousness, sequela: Secondary | ICD-10-CM | POA: Diagnosis not present

## 2016-12-24 NOTE — Telephone Encounter (Signed)
PT called to report that when she went out to see pt on Friday- pt told her she had "tried to get up by myself out of the bed" and she fell on Thursday night. She has a "knot on her head as well as an abrasion. No other pain was noted"

## 2016-12-26 DIAGNOSIS — S22019S Unspecified fracture of first thoracic vertebra, sequela: Secondary | ICD-10-CM | POA: Diagnosis not present

## 2016-12-26 DIAGNOSIS — M6281 Muscle weakness (generalized): Secondary | ICD-10-CM | POA: Diagnosis not present

## 2016-12-26 DIAGNOSIS — E119 Type 2 diabetes mellitus without complications: Secondary | ICD-10-CM | POA: Diagnosis not present

## 2016-12-26 DIAGNOSIS — I2510 Atherosclerotic heart disease of native coronary artery without angina pectoris: Secondary | ICD-10-CM | POA: Diagnosis not present

## 2016-12-26 DIAGNOSIS — I10 Essential (primary) hypertension: Secondary | ICD-10-CM | POA: Diagnosis not present

## 2016-12-26 DIAGNOSIS — S066X0S Traumatic subarachnoid hemorrhage without loss of consciousness, sequela: Secondary | ICD-10-CM | POA: Diagnosis not present

## 2016-12-26 DIAGNOSIS — R262 Difficulty in walking, not elsewhere classified: Secondary | ICD-10-CM | POA: Diagnosis not present

## 2016-12-27 DIAGNOSIS — R262 Difficulty in walking, not elsewhere classified: Secondary | ICD-10-CM | POA: Diagnosis not present

## 2016-12-27 DIAGNOSIS — S066X0S Traumatic subarachnoid hemorrhage without loss of consciousness, sequela: Secondary | ICD-10-CM | POA: Diagnosis not present

## 2016-12-27 DIAGNOSIS — E119 Type 2 diabetes mellitus without complications: Secondary | ICD-10-CM | POA: Diagnosis not present

## 2016-12-27 DIAGNOSIS — I2510 Atherosclerotic heart disease of native coronary artery without angina pectoris: Secondary | ICD-10-CM | POA: Diagnosis not present

## 2016-12-27 DIAGNOSIS — I10 Essential (primary) hypertension: Secondary | ICD-10-CM | POA: Diagnosis not present

## 2016-12-27 DIAGNOSIS — M6281 Muscle weakness (generalized): Secondary | ICD-10-CM | POA: Diagnosis not present

## 2016-12-27 DIAGNOSIS — S22019S Unspecified fracture of first thoracic vertebra, sequela: Secondary | ICD-10-CM | POA: Diagnosis not present

## 2016-12-28 DIAGNOSIS — M6281 Muscle weakness (generalized): Secondary | ICD-10-CM | POA: Diagnosis not present

## 2016-12-28 DIAGNOSIS — E119 Type 2 diabetes mellitus without complications: Secondary | ICD-10-CM | POA: Diagnosis not present

## 2016-12-28 DIAGNOSIS — S22019S Unspecified fracture of first thoracic vertebra, sequela: Secondary | ICD-10-CM | POA: Diagnosis not present

## 2016-12-28 DIAGNOSIS — I2510 Atherosclerotic heart disease of native coronary artery without angina pectoris: Secondary | ICD-10-CM | POA: Diagnosis not present

## 2016-12-28 DIAGNOSIS — R262 Difficulty in walking, not elsewhere classified: Secondary | ICD-10-CM | POA: Diagnosis not present

## 2016-12-28 DIAGNOSIS — I10 Essential (primary) hypertension: Secondary | ICD-10-CM | POA: Diagnosis not present

## 2016-12-28 DIAGNOSIS — S066X0S Traumatic subarachnoid hemorrhage without loss of consciousness, sequela: Secondary | ICD-10-CM | POA: Diagnosis not present

## 2017-01-01 DIAGNOSIS — I10 Essential (primary) hypertension: Secondary | ICD-10-CM | POA: Diagnosis not present

## 2017-01-01 DIAGNOSIS — E119 Type 2 diabetes mellitus without complications: Secondary | ICD-10-CM | POA: Diagnosis not present

## 2017-01-01 DIAGNOSIS — R262 Difficulty in walking, not elsewhere classified: Secondary | ICD-10-CM | POA: Diagnosis not present

## 2017-01-01 DIAGNOSIS — S066X0S Traumatic subarachnoid hemorrhage without loss of consciousness, sequela: Secondary | ICD-10-CM | POA: Diagnosis not present

## 2017-01-01 DIAGNOSIS — I2510 Atherosclerotic heart disease of native coronary artery without angina pectoris: Secondary | ICD-10-CM | POA: Diagnosis not present

## 2017-01-01 DIAGNOSIS — S22019S Unspecified fracture of first thoracic vertebra, sequela: Secondary | ICD-10-CM | POA: Diagnosis not present

## 2017-01-01 DIAGNOSIS — M6281 Muscle weakness (generalized): Secondary | ICD-10-CM | POA: Diagnosis not present

## 2017-01-02 DIAGNOSIS — S066X0S Traumatic subarachnoid hemorrhage without loss of consciousness, sequela: Secondary | ICD-10-CM | POA: Diagnosis not present

## 2017-01-02 DIAGNOSIS — I2510 Atherosclerotic heart disease of native coronary artery without angina pectoris: Secondary | ICD-10-CM | POA: Diagnosis not present

## 2017-01-02 DIAGNOSIS — M6281 Muscle weakness (generalized): Secondary | ICD-10-CM | POA: Diagnosis not present

## 2017-01-02 DIAGNOSIS — E119 Type 2 diabetes mellitus without complications: Secondary | ICD-10-CM | POA: Diagnosis not present

## 2017-01-02 DIAGNOSIS — I10 Essential (primary) hypertension: Secondary | ICD-10-CM | POA: Diagnosis not present

## 2017-01-02 DIAGNOSIS — R262 Difficulty in walking, not elsewhere classified: Secondary | ICD-10-CM | POA: Diagnosis not present

## 2017-01-02 DIAGNOSIS — S22019S Unspecified fracture of first thoracic vertebra, sequela: Secondary | ICD-10-CM | POA: Diagnosis not present

## 2017-01-07 DIAGNOSIS — S22019S Unspecified fracture of first thoracic vertebra, sequela: Secondary | ICD-10-CM | POA: Diagnosis not present

## 2017-01-07 DIAGNOSIS — I10 Essential (primary) hypertension: Secondary | ICD-10-CM | POA: Diagnosis not present

## 2017-01-07 DIAGNOSIS — E119 Type 2 diabetes mellitus without complications: Secondary | ICD-10-CM | POA: Diagnosis not present

## 2017-01-07 DIAGNOSIS — I2510 Atherosclerotic heart disease of native coronary artery without angina pectoris: Secondary | ICD-10-CM | POA: Diagnosis not present

## 2017-01-07 DIAGNOSIS — R262 Difficulty in walking, not elsewhere classified: Secondary | ICD-10-CM | POA: Diagnosis not present

## 2017-01-07 DIAGNOSIS — S066X0S Traumatic subarachnoid hemorrhage without loss of consciousness, sequela: Secondary | ICD-10-CM | POA: Diagnosis not present

## 2017-01-07 DIAGNOSIS — M6281 Muscle weakness (generalized): Secondary | ICD-10-CM | POA: Diagnosis not present

## 2017-01-10 DIAGNOSIS — S066X0S Traumatic subarachnoid hemorrhage without loss of consciousness, sequela: Secondary | ICD-10-CM | POA: Diagnosis not present

## 2017-01-10 DIAGNOSIS — R262 Difficulty in walking, not elsewhere classified: Secondary | ICD-10-CM | POA: Diagnosis not present

## 2017-01-10 DIAGNOSIS — S22019S Unspecified fracture of first thoracic vertebra, sequela: Secondary | ICD-10-CM | POA: Diagnosis not present

## 2017-01-10 DIAGNOSIS — I10 Essential (primary) hypertension: Secondary | ICD-10-CM | POA: Diagnosis not present

## 2017-01-10 DIAGNOSIS — M6281 Muscle weakness (generalized): Secondary | ICD-10-CM | POA: Diagnosis not present

## 2017-01-10 DIAGNOSIS — E119 Type 2 diabetes mellitus without complications: Secondary | ICD-10-CM | POA: Diagnosis not present

## 2017-01-10 DIAGNOSIS — I2510 Atherosclerotic heart disease of native coronary artery without angina pectoris: Secondary | ICD-10-CM | POA: Diagnosis not present

## 2017-01-16 DIAGNOSIS — I2510 Atherosclerotic heart disease of native coronary artery without angina pectoris: Secondary | ICD-10-CM | POA: Diagnosis not present

## 2017-01-16 DIAGNOSIS — I10 Essential (primary) hypertension: Secondary | ICD-10-CM | POA: Diagnosis not present

## 2017-01-16 DIAGNOSIS — M6281 Muscle weakness (generalized): Secondary | ICD-10-CM | POA: Diagnosis not present

## 2017-01-16 DIAGNOSIS — S066X0S Traumatic subarachnoid hemorrhage without loss of consciousness, sequela: Secondary | ICD-10-CM | POA: Diagnosis not present

## 2017-01-16 DIAGNOSIS — E119 Type 2 diabetes mellitus without complications: Secondary | ICD-10-CM | POA: Diagnosis not present

## 2017-01-16 DIAGNOSIS — R262 Difficulty in walking, not elsewhere classified: Secondary | ICD-10-CM | POA: Diagnosis not present

## 2017-01-16 DIAGNOSIS — S22019S Unspecified fracture of first thoracic vertebra, sequela: Secondary | ICD-10-CM | POA: Diagnosis not present

## 2017-01-21 IMAGING — CR DG HIP (WITH OR WITHOUT PELVIS) 2-3V*L*
3 series · 3 of 3 positions shown · non-contrast
Comparison: 01/18/2013.

CLINICAL DATA: Fall.  Pain.

EXAM:
DG HIP (WITH OR WITHOUT PELVIS) 2-3V LEFT

[pelvis ap]
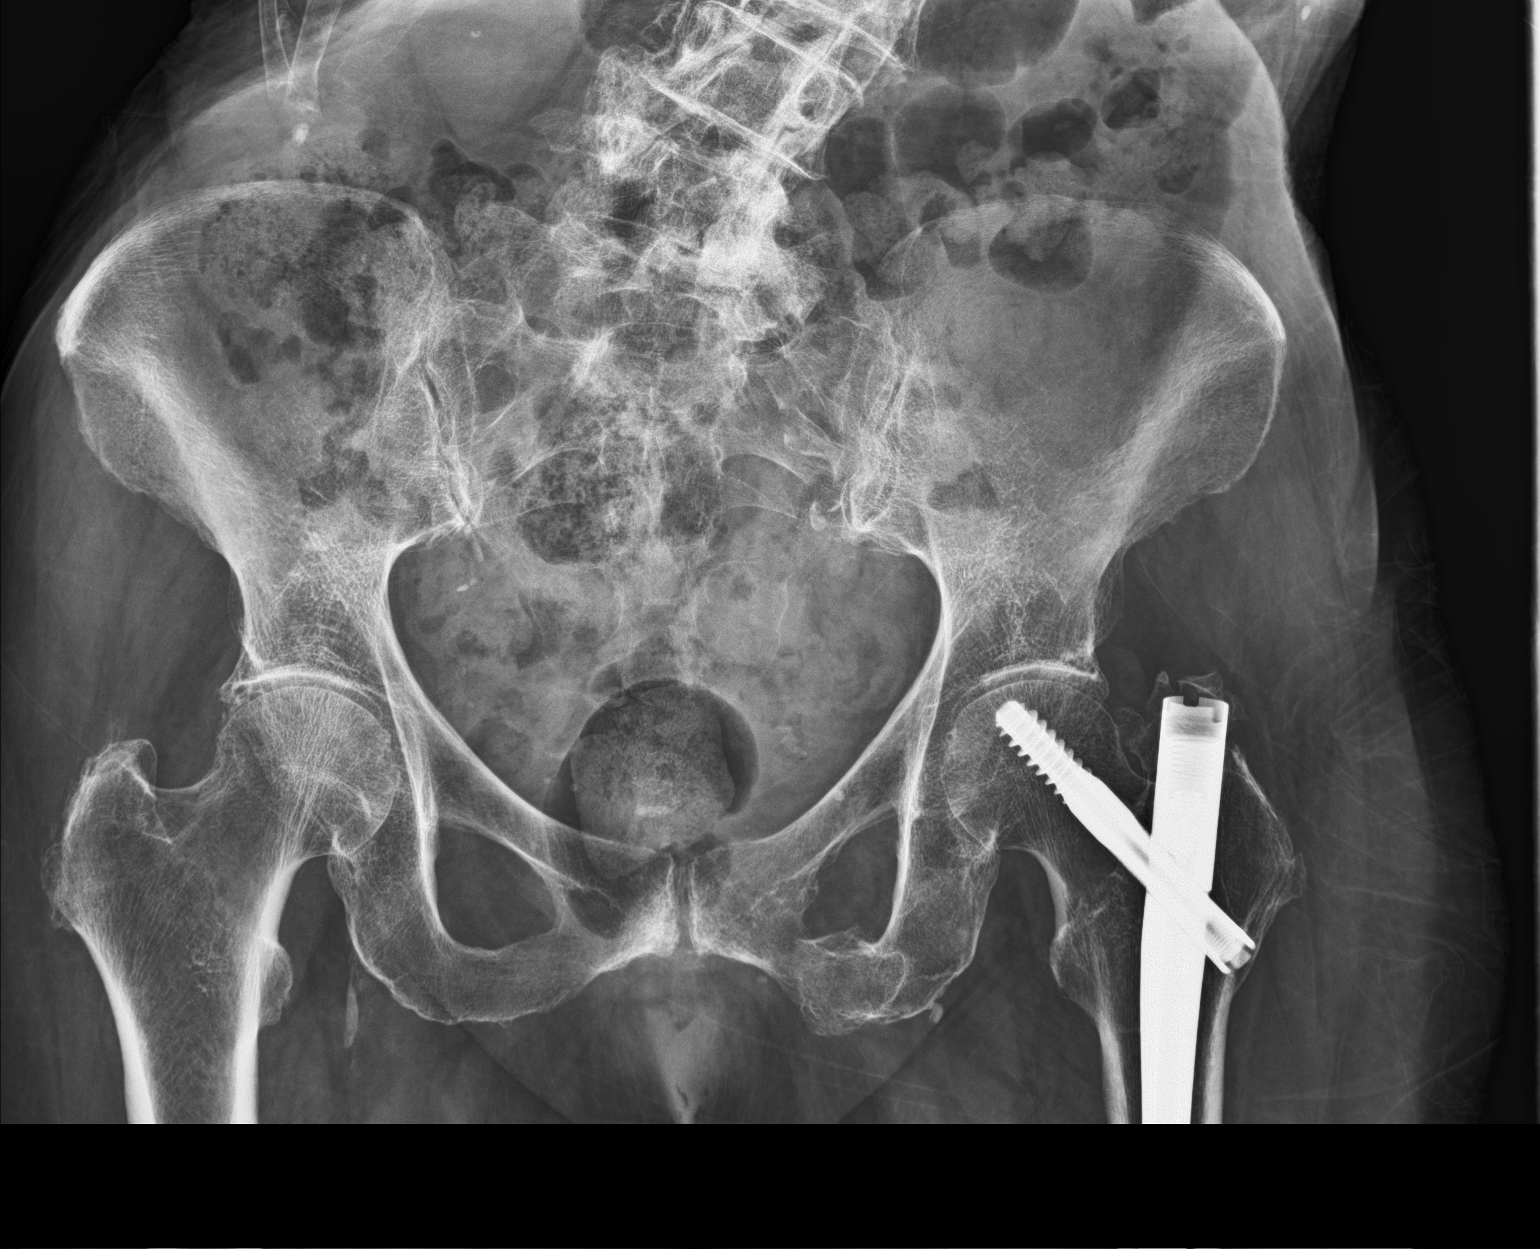

[hip ap]
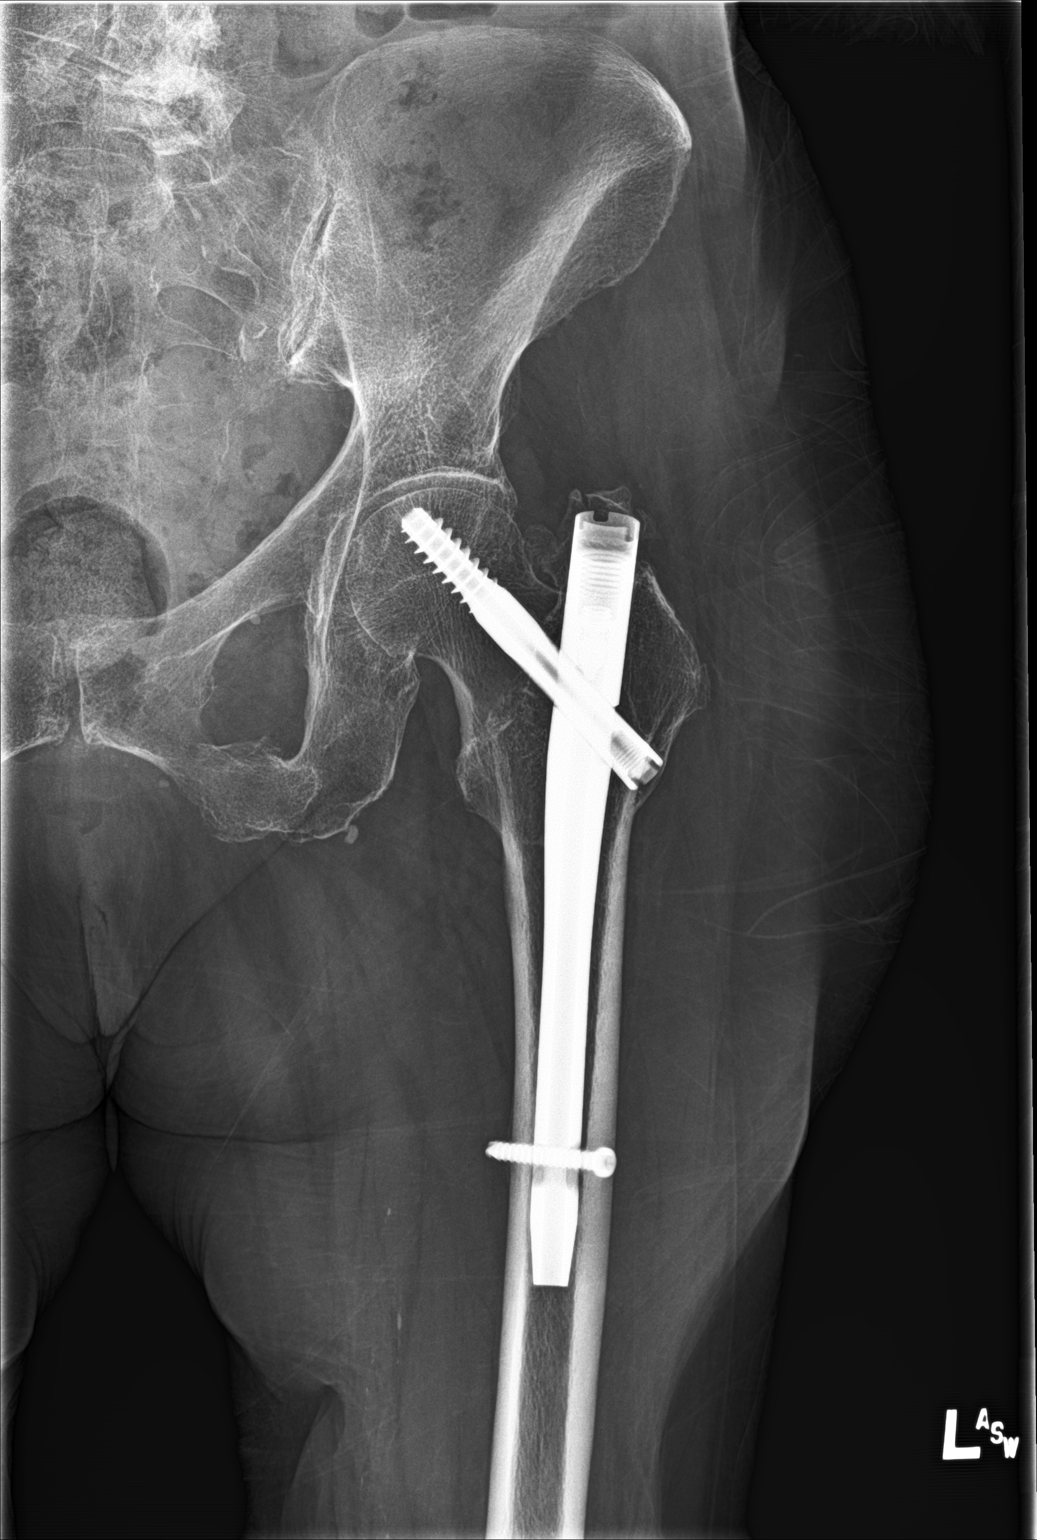

[hip lat]
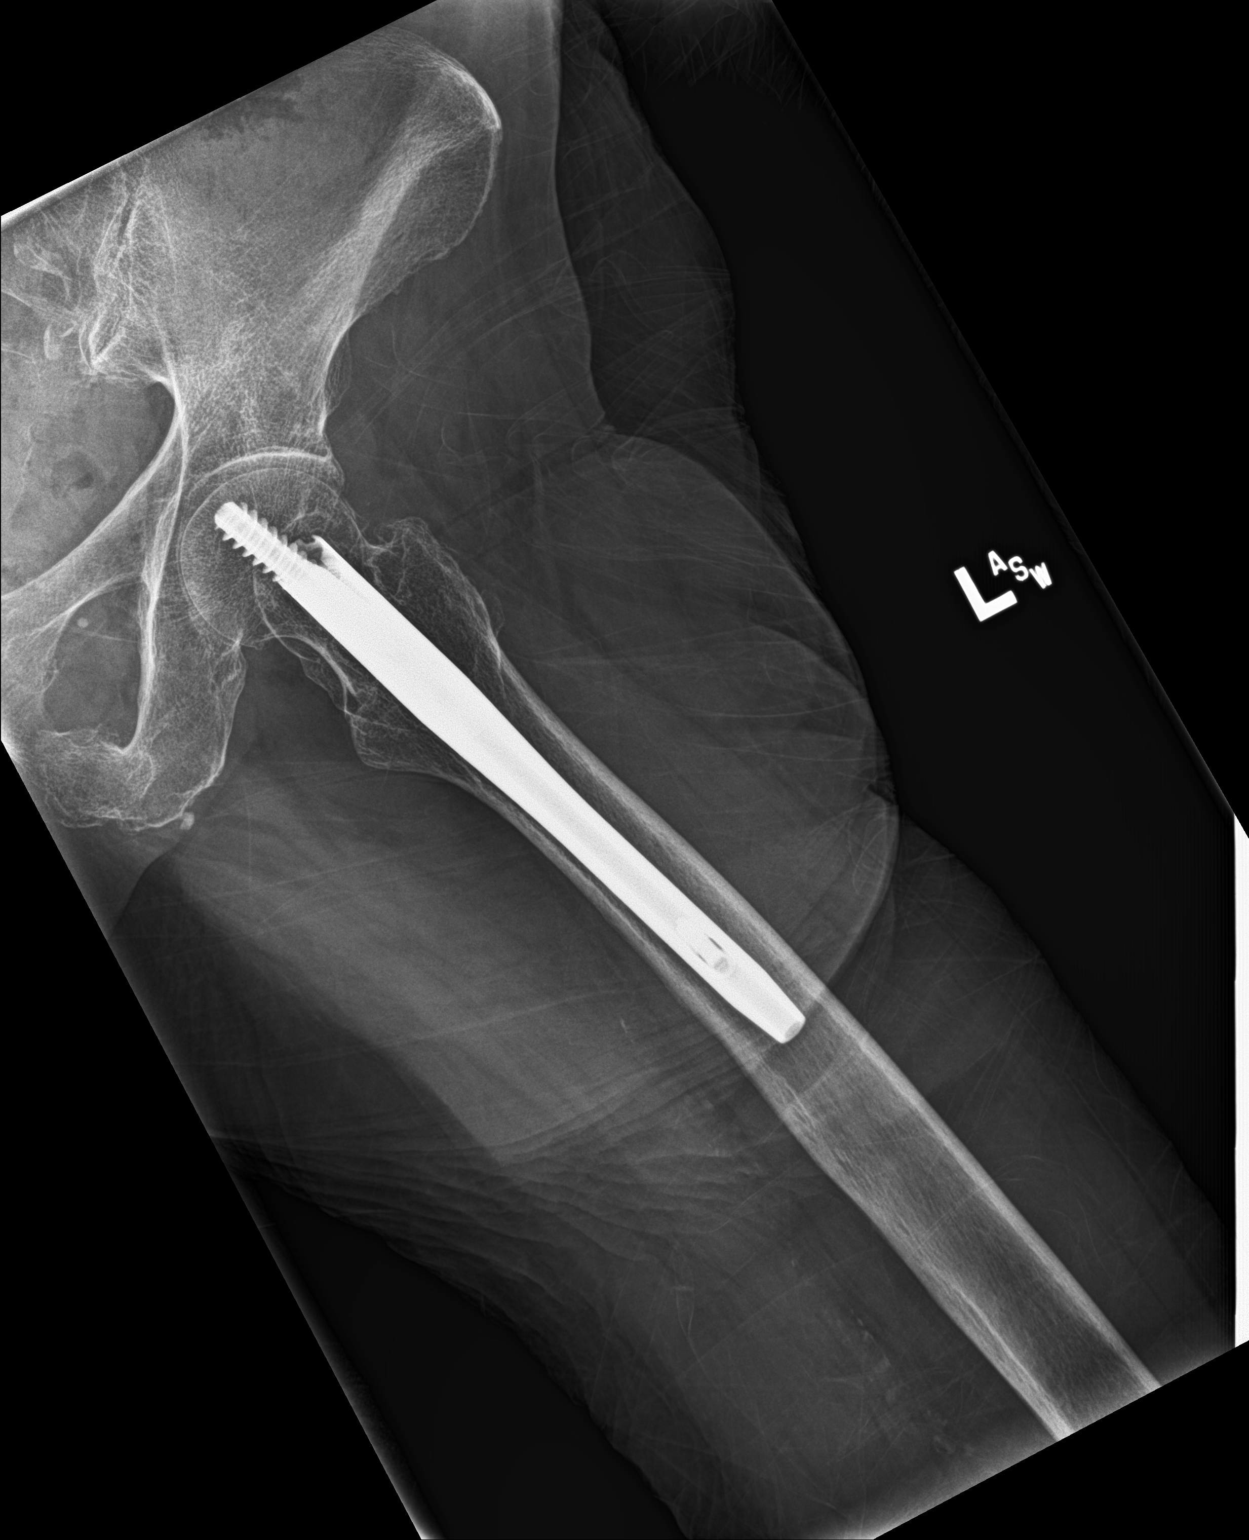

[3 of 3 positions shown; findings below may reference images not displayed]

FINDINGS: Degenerative changes lumbar spine and both hips. Lumbar scoliosis.
Prior ORIF left hip. Left inferior pubic ramus fracture noted. This
may be old. Callus formation appears to be present. No other focal
abnormality. Aortoiliac atherosclerotic-disease .
IMPRESSION: 1. Left inferior pubic ramus fracture. This may be old. Some degree
of callus formation appears to be present.

2. ORIF left hip.

3. Degenerative changes lumbar spine and both hips. Diffuse
osteopenia. Scoliosis lumbar spine.

4. Aortoiliac atherosclerotic vascular disease.

## 2017-01-23 DIAGNOSIS — S066X0S Traumatic subarachnoid hemorrhage without loss of consciousness, sequela: Secondary | ICD-10-CM | POA: Diagnosis not present

## 2017-01-23 DIAGNOSIS — I2510 Atherosclerotic heart disease of native coronary artery without angina pectoris: Secondary | ICD-10-CM | POA: Diagnosis not present

## 2017-01-23 DIAGNOSIS — S22019S Unspecified fracture of first thoracic vertebra, sequela: Secondary | ICD-10-CM | POA: Diagnosis not present

## 2017-01-23 DIAGNOSIS — E119 Type 2 diabetes mellitus without complications: Secondary | ICD-10-CM | POA: Diagnosis not present

## 2017-01-23 DIAGNOSIS — I10 Essential (primary) hypertension: Secondary | ICD-10-CM | POA: Diagnosis not present

## 2017-01-23 DIAGNOSIS — R262 Difficulty in walking, not elsewhere classified: Secondary | ICD-10-CM | POA: Diagnosis not present

## 2017-01-23 DIAGNOSIS — M6281 Muscle weakness (generalized): Secondary | ICD-10-CM | POA: Diagnosis not present

## 2017-02-27 ENCOUNTER — Encounter: Payer: Self-pay | Admitting: Family Medicine

## 2017-02-27 ENCOUNTER — Ambulatory Visit (INDEPENDENT_AMBULATORY_CARE_PROVIDER_SITE_OTHER): Payer: Medicare Other | Admitting: Family Medicine

## 2017-02-27 ENCOUNTER — Ambulatory Visit
Admission: RE | Admit: 2017-02-27 | Discharge: 2017-02-27 | Disposition: A | Discharge status: Home / Self Care | Payer: Medicare Other | Source: Ambulatory Visit | Attending: Family Medicine | Admitting: Family Medicine

## 2017-02-27 VITALS — BP 120/62 | HR 72 | Ht 59.0 in | Wt 111.0 lb

## 2017-02-27 DIAGNOSIS — W19XXXA Unspecified fall, initial encounter: Secondary | ICD-10-CM | POA: Diagnosis not present

## 2017-02-27 DIAGNOSIS — R0602 Shortness of breath: Secondary | ICD-10-CM

## 2017-02-27 DIAGNOSIS — R918 Other nonspecific abnormal finding of lung field: Secondary | ICD-10-CM | POA: Diagnosis not present

## 2017-02-27 DIAGNOSIS — S2242XA Multiple fractures of ribs, left side, initial encounter for closed fracture: Secondary | ICD-10-CM | POA: Insufficient documentation

## 2017-02-27 DIAGNOSIS — E039 Hypothyroidism, unspecified: Secondary | ICD-10-CM | POA: Insufficient documentation

## 2017-02-27 DIAGNOSIS — R0781 Pleurodynia: Secondary | ICD-10-CM

## 2017-02-27 DIAGNOSIS — E785 Hyperlipidemia, unspecified: Secondary | ICD-10-CM

## 2017-02-27 DIAGNOSIS — M79621 Pain in right upper arm: Secondary | ICD-10-CM

## 2017-02-27 DIAGNOSIS — X58XXXA Exposure to other specified factors, initial encounter: Secondary | ICD-10-CM | POA: Diagnosis not present

## 2017-02-27 DIAGNOSIS — E782 Mixed hyperlipidemia: Secondary | ICD-10-CM | POA: Diagnosis not present

## 2017-02-27 DIAGNOSIS — E119 Type 2 diabetes mellitus without complications: Secondary | ICD-10-CM | POA: Diagnosis not present

## 2017-02-27 DIAGNOSIS — M898X2 Other specified disorders of bone, upper arm: Secondary | ICD-10-CM

## 2017-02-27 DIAGNOSIS — S4991XA Unspecified injury of right shoulder and upper arm, initial encounter: Secondary | ICD-10-CM | POA: Diagnosis not present

## 2017-02-27 DIAGNOSIS — I10 Essential (primary) hypertension: Secondary | ICD-10-CM | POA: Diagnosis not present

## 2017-02-27 DIAGNOSIS — I700 Atherosclerosis of aorta: Secondary | ICD-10-CM | POA: Diagnosis not present

## 2017-02-27 DIAGNOSIS — S299XXA Unspecified injury of thorax, initial encounter: Secondary | ICD-10-CM | POA: Diagnosis not present

## 2017-02-27 MED ORDER — LISINOPRIL 5 MG PO TABS: 5.0000 mg | ORAL_TABLET | Freq: Every day | ORAL | 2 refills | Status: DC

## 2017-02-27 MED ORDER — LOVASTATIN 40 MG PO TABS: 40.0000 mg | ORAL_TABLET | Freq: Every day | ORAL | 2 refills | Status: DC

## 2017-02-27 MED ORDER — LEVOTHYROXINE SODIUM 50 MCG PO TABS: 50.0000 ug | ORAL_TABLET | Freq: Every day | ORAL | 2 refills | Status: AC

## 2017-02-27 MED ORDER — AMLODIPINE BESYLATE 2.5 MG PO TABS: 2.5000 mg | ORAL_TABLET | Freq: Every day | ORAL | 2 refills | Status: DC

## 2017-02-27 MED ORDER — METOPROLOL SUCCINATE ER 50 MG PO TB24: 50.0000 mg | ORAL_TABLET | Freq: Every day | ORAL | 2 refills | Status: AC

## 2017-02-27 MED ORDER — METFORMIN HCL 500 MG PO TABS: ORAL_TABLET | ORAL | 2 refills | Status: AC

## 2017-02-27 NOTE — Progress Notes (Signed)
Name: Lorraine Fischer   MRN: [id number]    DOB: May 22, 1923   Date:02/27/2017       Progress Note  Subjective  Chief Complaint  Chief Complaint  Patient presents with  . Fall    has not felt good since she fell about a week and half ago- hit L) ribs and back when fell  . Hypothyroidism  . Hypertension    Fall  The accident occurred more than 1 week ago. The fall occurred while walking (in darkness). She landed on carpet. The point of impact was the right shoulder (left ribs). The symptoms are aggravated by ambulation and use of injured limb. Pertinent negatives include no abdominal pain, fever, headaches, hematuria, loss of consciousness, nausea, numbness or tingling. She has tried NSAID for the symptoms. The treatment provided mild relief.  Hypertension  This is a chronic problem. The current episode started more than 1 year ago. The problem is controlled. Pertinent negatives include no blurred vision, chest pain, headaches, malaise/fatigue, neck pain, palpitations or shortness of breath. Agents associated with hypertension include NSAIDs. Risk factors for coronary artery disease include dyslipidemia. There are no compliance problems.  There is no history of angina, kidney disease, CAD/MI, CVA, heart failure, left ventricular hypertrophy, PVD or retinopathy. Identifiable causes of hypertension include a thyroid problem. There is no history of chronic renal disease, a hypertension causing med or renovascular disease.  Hyperlipidemia  This is a chronic problem. The problem is controlled. Recent lipid tests were reviewed and are normal. She has no history of chronic renal disease. Pertinent negatives include no chest pain, focal weakness, myalgias or shortness of breath. Current antihyperlipidemic treatment includes statins. The current treatment provides moderate improvement of lipids. There are no compliance problems.   Thyroid Problem  Presents for follow-up visit. Symptoms include fatigue.  Patient reports no anxiety, constipation, diarrhea, palpitations or weight loss. The symptoms have been stable. Her past medical history is significant for hyperlipidemia. There is no history of heart failure.    No problem-specific Assessment & Plan notes found for this encounter.   Past Medical History:  Diagnosis Date  . Diabetes mellitus without complication ([place])   . Hyperlipidemia   . Hypertension   . Thyroid disease     Past Surgical History:  Procedure Laterality Date  . ANKLE SURGERY    . HIP FRACTURE SURGERY    . WRIST SURGERY Right     No family history on file.  Social History   Social History  . Marital status: Widowed    Spouse name: N/A  . Number of children: N/A  . Years of education: N/A   Occupational History  . Not on file.   Social History Main Topics  . Smoking status: Never Smoker  . Smokeless tobacco: Never Used  . Alcohol use No  . Drug use: No  . Sexual activity: Not Currently   Other Topics Concern  . Not on file   Social History Narrative  . No narrative on file    Allergies  Allergen Reactions  . Sulfa Antibiotics Shortness Of Breath    Outpatient Medications Prior to Visit  Medication Sig Dispense Refill  . aspirin 81 MG chewable tablet Chew 81 mg by mouth daily.    . Cranberry 425 MG CAPS Take 1 capsule by mouth daily.    . Melatonin 3 MG TBDP Take 1 tablet by mouth at bedtime.    Marland Kitchen nystatin cream (MYCOSTATIN) Apply 1 application topically 2 (two) times daily. Justin  g 0  . amLODipine (NORVASC) 2.5 MG tablet Take 1 tablet (2.5 mg total) by mouth daily. 90 tablet 2  . levothyroxine (SYNTHROID, LEVOTHROID) 50 MCG tablet Take 1 tablet (50 mcg total) by mouth daily before breakfast. 90 tablet 1  . lisinopril (PRINIVIL,ZESTRIL) 5 MG tablet Take 1 tablet (5 mg total) by mouth daily. 90 tablet 2  . lovastatin (MEVACOR) 40 MG tablet Take 1 tablet (40 mg total) by mouth daily. 90 tablet 1  . metFORMIN (GLUCOPHAGE) 500 MG tablet TAKE ONE  TABLET BY MOUTH ONCE DAILY WITH BREAKFAT 90 tablet 2  . metoprolol succinate (TOPROL-XL) 50 MG 24 hr tablet Take 1 tablet (50 mg total) by mouth daily. Take with or immediately following a meal. 90 tablet 2  . doxycycline (VIBRA-TABS) 100 MG tablet Take 1 tablet (100 mg total) by mouth 2 (two) times daily with a meal. 20 tablet 0   No facility-administered medications prior to visit.     Review of Systems  Constitutional: Positive for fatigue. Negative for chills, fever, malaise/fatigue and weight loss.  HENT: Negative for ear discharge, ear pain and sore throat.   Eyes: Negative for blurred vision.  Respiratory: Negative for cough, sputum production, shortness of breath and wheezing.   Cardiovascular: Negative for chest pain, palpitations and leg swelling.  Gastrointestinal: Negative for abdominal pain, blood in stool, constipation, diarrhea, heartburn, melena and nausea.  Genitourinary: Negative for dysuria, frequency, hematuria and urgency.  Musculoskeletal: Positive for back pain and joint pain. Negative for myalgias and neck pain.  Skin: Negative for rash.  Neurological: Negative for dizziness, tingling, sensory change, focal weakness, loss of consciousness, numbness and headaches.  Endo/Heme/Allergies: Negative for environmental allergies and polydipsia. Does not bruise/bleed easily.  Psychiatric/Behavioral: Negative for depression and suicidal ideas. The patient is not nervous/anxious and does not have insomnia.      Objective  Vitals:   02/27/17 0938  BP: 120/62  Pulse: 72  Weight: 111 lb (50.3 kg)  Height: 4\' 11"  (1.499 m)    Physical Exam  Constitutional: She is well-developed, well-nourished, and in no distress. No distress.  HENT:  Head: Normocephalic and atraumatic.  Right Ear: External ear normal.  Left Ear: External ear normal.  Nose: Nose normal.  Mouth/Throat: Oropharynx is clear and moist.  Eyes: Conjunctivae and EOM are normal. Pupils are equal, round, and  reactive to light. Right eye exhibits no discharge. Left eye exhibits no discharge.  Neck: Normal range of motion. Neck supple. No JVD present. No thyromegaly present.  Cardiovascular: Normal rate, regular rhythm, normal heart sounds and intact distal pulses.  Exam reveals no gallop and no friction rub.   No murmur heard. Pulmonary/Chest: Effort normal and breath sounds normal. She has no wheezes. She has no rales. She exhibits tenderness.    Tender ribs left 9/10  Abdominal: Soft. Bowel sounds are normal. She exhibits no mass. There is no tenderness. There is no guarding.  Musculoskeletal: Normal range of motion. She exhibits no edema.       Right upper arm: She exhibits tenderness.       Arms: Lymphadenopathy:    She has no cervical adenopathy.  Neurological: She is alert. She has normal reflexes.  Skin: Skin is warm and dry. She is not diaphoretic.  Psychiatric: Mood and affect normal.  Nursing note and vitals reviewed.     Assessment & Plan  Problem List Items Addressed This Visit      Cardiovascular and Mediastinum   Essential hypertension  Relevant Medications   lovastatin (MEVACOR) 40 MG tablet   metoprolol succinate (TOPROL-XL) 50 MG 24 hr tablet   lisinopril (PRINIVIL,ZESTRIL) 5 MG tablet   amLODipine (NORVASC) 2.5 MG tablet   Other Relevant Orders   Renal Function Panel     Endocrine   Adult hypothyroidism   Relevant Medications   levothyroxine (SYNTHROID, LEVOTHROID) 50 MCG tablet   metoprolol succinate (TOPROL-XL) 50 MG 24 hr tablet   Other Relevant Orders   TSH    Other Visit Diagnoses    Fall, initial encounter    -  Primary   Relevant Orders   DG Humerus Right   DG Ribs Unilateral Left   DG Ribs Unilateral W/Chest Left   Pain of right humerus       Rib pain on left side       Relevant Orders   DG Ribs Unilateral W/Chest Left   Mixed hyperlipidemia       Relevant Medications   lovastatin (MEVACOR) 40 MG tablet   metoprolol succinate (TOPROL-XL)  50 MG 24 hr tablet   lisinopril (PRINIVIL,ZESTRIL) 5 MG tablet   amLODipine (NORVASC) 2.5 MG tablet   Other Relevant Orders   Lipid Profile   Type 2 diabetes mellitus without complication, without long-term current use of insulin (HCC)       Relevant Medications   metFORMIN (GLUCOPHAGE) 500 MG tablet   lovastatin (MEVACOR) 40 MG tablet   lisinopril (PRINIVIL,ZESTRIL) 5 MG tablet   Other Relevant Orders   Renal Function Panel   Hypothyroidism, unspecified type       Relevant Medications   levothyroxine (SYNTHROID, LEVOTHROID) 50 MCG tablet   metoprolol succinate (TOPROL-XL) 50 MG 24 hr tablet   Hyperlipidemia, unspecified hyperlipidemia type       Relevant Medications   lovastatin (MEVACOR) 40 MG tablet   metoprolol succinate (TOPROL-XL) 50 MG 24 hr tablet   lisinopril (PRINIVIL,ZESTRIL) 5 MG tablet   amLODipine (NORVASC) 2.5 MG tablet   SOB (shortness of breath)       Relevant Orders   DG Ribs Unilateral W/Chest Left      Meds ordered this encounter  Medications  . metFORMIN (GLUCOPHAGE) 500 MG tablet    Sig: TAKE ONE TABLET BY MOUTH ONCE DAILY WITH BREAKFAT    Dispense:  90 tablet    Refill:  2  . levothyroxine (SYNTHROID, LEVOTHROID) 50 MCG tablet    Sig: Take 1 tablet (50 mcg total) by mouth daily before breakfast.    Dispense:  90 tablet    Refill:  2  . lovastatin (MEVACOR) 40 MG tablet    Sig: Take 1 tablet (40 mg total) by mouth daily.    Dispense:  90 tablet    Refill:  2  . metoprolol succinate (TOPROL-XL) 50 MG 24 hr tablet    Sig: Take 1 tablet (50 mg total) by mouth daily. Take with or immediately following a meal.    Dispense:  90 tablet    Refill:  2  . lisinopril (PRINIVIL,ZESTRIL) 5 MG tablet    Sig: Take 1 tablet (5 mg total) by mouth daily.    Dispense:  90 tablet    Refill:  2  . amLODipine (NORVASC) 2.5 MG tablet    Sig: Take 1 tablet (2.5 mg total) by mouth daily.    Dispense:  90 tablet    Refill:  2      Dr. Otilio Miu [facility]  02/27/17

## 2017-02-27 NOTE — Patient Instructions (Signed)

## 2017-02-28 ENCOUNTER — Other Ambulatory Visit: Payer: Self-pay

## 2017-02-28 LAB — LIPID PANEL
CHOL/HDL RATIO: 3.4 ratio (ref 0.0–4.4)
Cholesterol, Total: 162 mg/dL (ref 100–199)
HDL: 48 mg/dL (ref >39)
LDL Calculated: 98 mg/dL (ref 0–99)
Triglycerides: 81 mg/dL (ref 0–149)
VLDL Cholesterol Cal: 16 mg/dL (ref 5–40)

## 2017-02-28 LAB — RENAL FUNCTION PANEL
Albumin: 3.4 g/dL (ref 3.2–4.6)
BUN / CREAT RATIO: 16 (ref 12–28)
BUN: 15 mg/dL (ref 10–36)
CALCIUM: 9.2 mg/dL (ref 8.7–10.3)
CO2: 19 mmol/L — ABNORMAL LOW (ref 20–29)
Chloride: 99 mmol/L (ref 96–106)
Creatinine, Ser: 0.91 mg/dL (ref 0.57–1.00)
GFR calc Af Amer: 63 mL/min/{1.73_m2} (ref >59)
GFR, EST NON AFRICAN AMERICAN: 55 mL/min/{1.73_m2} — AB (ref >59)
GLUCOSE: 111 mg/dL — AB (ref 65–99)
PHOSPHORUS: 2.9 mg/dL (ref 2.5–4.5)
POTASSIUM: 5.4 mmol/L — AB (ref 3.5–5.2)
SODIUM: 134 mmol/L (ref 134–144)

## 2017-02-28 LAB — TSH: TSH: 4.69 u[IU]/mL — ABNORMAL HIGH (ref 0.450–4.500)

## 2017-02-28 MED ORDER — AZITHROMYCIN 250 MG PO TABS: ORAL_TABLET | ORAL | 0 refills | Status: DC

## 2017-03-06 ENCOUNTER — Ambulatory Visit (INDEPENDENT_AMBULATORY_CARE_PROVIDER_SITE_OTHER): Payer: Medicare Other | Admitting: Family Medicine

## 2017-03-06 ENCOUNTER — Ambulatory Visit
Admission: RE | Admit: 2017-03-06 | Discharge: 2017-03-06 | Disposition: A | Discharge status: Home / Self Care | Payer: Medicare Other | Source: Ambulatory Visit | Attending: Family Medicine | Admitting: Family Medicine

## 2017-03-06 VITALS — BP 120/70 | HR 68 | Ht <= 58 in | Wt 111.0 lb

## 2017-03-06 DIAGNOSIS — I700 Atherosclerosis of aorta: Secondary | ICD-10-CM | POA: Diagnosis not present

## 2017-03-06 DIAGNOSIS — R9389 Abnormal findings on diagnostic imaging of other specified body structures: Secondary | ICD-10-CM

## 2017-03-06 DIAGNOSIS — R0602 Shortness of breath: Secondary | ICD-10-CM

## 2017-03-06 DIAGNOSIS — R938 Abnormal findings on diagnostic imaging of other specified body structures: Secondary | ICD-10-CM

## 2017-03-06 DIAGNOSIS — I872 Venous insufficiency (chronic) (peripheral): Secondary | ICD-10-CM | POA: Diagnosis not present

## 2017-03-06 DIAGNOSIS — R600 Localized edema: Secondary | ICD-10-CM

## 2017-03-06 DIAGNOSIS — J189 Pneumonia, unspecified organism: Secondary | ICD-10-CM | POA: Diagnosis not present

## 2017-03-06 DIAGNOSIS — L600 Ingrowing nail: Secondary | ICD-10-CM

## 2017-03-06 DIAGNOSIS — J90 Pleural effusion, not elsewhere classified: Secondary | ICD-10-CM | POA: Diagnosis not present

## 2017-03-06 MED ORDER — FUROSEMIDE 20 MG PO TABS: 20.0000 mg | ORAL_TABLET | Freq: Every day | ORAL | 3 refills | Status: DC

## 2017-03-06 NOTE — Progress Notes (Signed)
Name: Lorraine Fischer   MRN: [id number]    DOB: 11-Jun-1923   Date:03/06/2017       Progress Note  Subjective  Chief Complaint  Chief Complaint  Patient presents with  . Follow-up    pneumonia- finished antibiotic     Shortness of Breath  This is a recurrent problem. The current episode started more than 1 month ago. The problem has been gradually improving (still not recover). Associated symptoms include leg swelling. Pertinent negatives include no abdominal pain, chest pain, ear pain, fever, headaches, hemoptysis, neck pain, orthopnea, PND, rash, rhinorrhea, sore throat, sputum production, swollen glands, syncope, vomiting or wheezing. Exacerbated by: seditary most of day. Risk factors include prolonged immobilization. (Heart disease)    No problem-specific Assessment & Plan notes found for this encounter.   Past Medical History:  Diagnosis Date  . Diabetes mellitus without complication ([place])   . Hyperlipidemia   . Hypertension   . Thyroid disease     Past Surgical History:  Procedure Laterality Date  . ANKLE SURGERY    . HIP FRACTURE SURGERY    . WRIST SURGERY Right     No family history on file.  Social History   Social History  . Marital status: Widowed    Spouse name: N/A  . Number of children: N/A  . Years of education: N/A   Occupational History  . Not on file.   Social History Main Topics  . Smoking status: Never Smoker  . Smokeless tobacco: Never Used  . Alcohol use No  . Drug use: No  . Sexual activity: Not Currently   Other Topics Concern  . Not on file   Social History Narrative  . No narrative on file    Allergies  Allergen Reactions  . Sulfa Antibiotics Shortness Of Breath    Outpatient Medications Prior to Visit  Medication Sig Dispense Refill  . amLODipine (NORVASC) 2.5 MG tablet Take 1 tablet (2.5 mg total) by mouth daily. 90 tablet 2  . aspirin 81 MG chewable tablet Chew 81 mg by mouth daily.    . Cranberry 425 MG CAPS Take 1  capsule by mouth daily.    Marland Kitchen levothyroxine (SYNTHROID, LEVOTHROID) 50 MCG tablet Take 1 tablet (50 mcg total) by mouth daily before breakfast. 90 tablet 2  . lisinopril (PRINIVIL,ZESTRIL) 5 MG tablet Take 1 tablet (5 mg total) by mouth daily. 90 tablet 2  . lovastatin (MEVACOR) 40 MG tablet Take 1 tablet (40 mg total) by mouth daily. 90 tablet 2  . Melatonin 3 MG TBDP Take 1 tablet by mouth at bedtime.    . metFORMIN (GLUCOPHAGE) 500 MG tablet TAKE ONE TABLET BY MOUTH ONCE DAILY WITH BREAKFAT 90 tablet 2  . metoprolol succinate (TOPROL-XL) 50 MG 24 hr tablet Take 1 tablet (50 mg total) by mouth daily. Take with or immediately following a meal. 90 tablet 2  . nystatin cream (MYCOSTATIN) Apply 1 application topically 2 (two) times daily. 30 g 0  . azithromycin (ZITHROMAX) 250 MG tablet Take 2 pills today and 1 pill every day for 4 days 6 tablet 0   No facility-administered medications prior to visit.     Review of Systems  Constitutional: Negative for chills, fever, malaise/fatigue and weight loss.  HENT: Negative for ear discharge, ear pain, rhinorrhea and sore throat.   Eyes: Negative for blurred vision.  Respiratory: Positive for shortness of breath. Negative for cough, hemoptysis, sputum production and wheezing.   Cardiovascular: Positive for leg swelling. Negative  for chest pain, palpitations, orthopnea, syncope and PND.  Gastrointestinal: Negative for abdominal pain, blood in stool, constipation, diarrhea, heartburn, melena, nausea and vomiting.  Genitourinary: Negative for dysuria, frequency, hematuria and urgency.  Musculoskeletal: Negative for back pain, joint pain, myalgias and neck pain.  Skin: Negative for rash.  Neurological: Negative for dizziness, tingling, sensory change, focal weakness and headaches.  Endo/Heme/Allergies: Negative for environmental allergies and polydipsia. Does not bruise/bleed easily.  Psychiatric/Behavioral: Negative for depression and suicidal ideas. The  patient is not nervous/anxious and does not have insomnia.      Objective  Vitals:   03/06/17 0927  BP: 120/70  Pulse: 68  SpO2: 96%  Weight: 111 lb (50.3 kg)  Height: 4\' 6"  (1.372 m)    Physical Exam  Constitutional: She is well-developed, well-nourished, and in no distress. No distress.  HENT:  Head: Normocephalic and atraumatic.  Right Ear: External ear normal.  Left Ear: External ear normal.  Nose: Nose normal.  Mouth/Throat: Oropharynx is clear and moist.  Eyes: Pupils are equal, round, and reactive to light. Conjunctivae and EOM are normal. Right eye exhibits no discharge. Left eye exhibits no discharge.  Neck: Normal range of motion. Neck supple. No JVD present. No thyromegaly present.  Cardiovascular: Normal rate, regular rhythm, S1 normal, S2 normal, normal heart sounds, intact distal pulses and normal pulses.  PMI is not displaced.  Exam reveals no gallop, no S3, no S4 and no friction rub.   No murmur heard. bilat edema  L>R  Pulmonary/Chest: Effort normal. She has decreased breath sounds in the right lower field and the left lower field. She has no wheezes. She has no rales.  Abdominal: Soft. Bowel sounds are normal. She exhibits no mass. There is no tenderness. There is no guarding.  Musculoskeletal: Normal range of motion. She exhibits no edema.  Lymphadenopathy:    She has no cervical adenopathy.  Neurological: She is alert. She has normal reflexes.  Skin: Skin is warm and dry. No rash noted. She is not diaphoretic. There is erythema.  Ingrown nail left great toe  Psychiatric: Mood and affect normal.  Nursing note and vitals reviewed.     Assessment & Plan  Problem List Items Addressed This Visit    None    Visit Diagnoses    Shortness of breath    -  Primary   Relevant Medications   furosemide (LASIX) 20 MG tablet   Other Relevant Orders   Ambulatory referral to Vascular Surgery   Ambulatory referral to Cardiology   DG Chest 2 View   Abnormal  chest x-ray       Relevant Orders   DG Chest 2 View   Localized edema       bilateral lower legs/L>R   Relevant Medications   furosemide (LASIX) 20 MG tablet   Ingrown nail       Relevant Orders   Ambulatory referral to Podiatry   Venous insufficiency       Relevant Medications   furosemide (LASIX) 20 MG tablet   Other Relevant Orders   Ambulatory referral to Vascular Surgery   Aortic atherosclerosis (HCC)       Relevant Medications   furosemide (LASIX) 20 MG tablet      Meds ordered this encounter  Medications  . furosemide (LASIX) 20 MG tablet    Sig: Take 1 tablet (20 mg total) by mouth daily.    Dispense:  30 tablet    Refill:  3  Dr. [facility]  03/06/17

## 2017-03-07 ENCOUNTER — Other Ambulatory Visit: Payer: Self-pay

## 2017-03-07 DIAGNOSIS — I10 Essential (primary) hypertension: Secondary | ICD-10-CM

## 2017-03-08 DIAGNOSIS — R0609 Other forms of dyspnea: Secondary | ICD-10-CM | POA: Diagnosis not present

## 2017-03-11 ENCOUNTER — Other Ambulatory Visit: Payer: Self-pay | Admitting: Cardiology

## 2017-03-11 DIAGNOSIS — R0609 Other forms of dyspnea: Principal | ICD-10-CM

## 2017-03-19 ENCOUNTER — Ambulatory Visit
Admission: RE | Admit: 2017-03-19 | Discharge: 2017-03-19 | Disposition: A | Discharge status: Home / Self Care | Payer: Medicare Other | Source: Ambulatory Visit | Attending: Cardiology | Admitting: Cardiology

## 2017-03-19 ENCOUNTER — Ambulatory Visit: Admission: RE | Admit: 2017-03-19 | Payer: Medicare Other | Source: Ambulatory Visit

## 2017-03-19 DIAGNOSIS — I2510 Atherosclerotic heart disease of native coronary artery without angina pectoris: Secondary | ICD-10-CM | POA: Insufficient documentation

## 2017-03-19 DIAGNOSIS — R29890 Loss of height: Secondary | ICD-10-CM | POA: Diagnosis not present

## 2017-03-19 DIAGNOSIS — I10 Essential (primary) hypertension: Secondary | ICD-10-CM | POA: Diagnosis not present

## 2017-03-19 DIAGNOSIS — X58XXXA Exposure to other specified factors, initial encounter: Secondary | ICD-10-CM | POA: Insufficient documentation

## 2017-03-19 DIAGNOSIS — S2220XA Unspecified fracture of sternum, initial encounter for closed fracture: Secondary | ICD-10-CM | POA: Insufficient documentation

## 2017-03-19 DIAGNOSIS — Z79899 Other long term (current) drug therapy: Secondary | ICD-10-CM | POA: Diagnosis not present

## 2017-03-19 DIAGNOSIS — R0781 Pleurodynia: Secondary | ICD-10-CM | POA: Diagnosis not present

## 2017-03-19 DIAGNOSIS — E119 Type 2 diabetes mellitus without complications: Secondary | ICD-10-CM | POA: Diagnosis not present

## 2017-03-19 DIAGNOSIS — S299XXA Unspecified injury of thorax, initial encounter: Secondary | ICD-10-CM | POA: Diagnosis not present

## 2017-03-19 DIAGNOSIS — S2242XA Multiple fractures of ribs, left side, initial encounter for closed fracture: Secondary | ICD-10-CM | POA: Insufficient documentation

## 2017-03-19 DIAGNOSIS — I517 Cardiomegaly: Secondary | ICD-10-CM | POA: Diagnosis not present

## 2017-03-19 DIAGNOSIS — R0602 Shortness of breath: Secondary | ICD-10-CM | POA: Diagnosis not present

## 2017-03-19 DIAGNOSIS — R0609 Other forms of dyspnea: Secondary | ICD-10-CM | POA: Insufficient documentation

## 2017-03-19 DIAGNOSIS — I700 Atherosclerosis of aorta: Secondary | ICD-10-CM | POA: Diagnosis not present

## 2017-03-19 DIAGNOSIS — R918 Other nonspecific abnormal finding of lung field: Secondary | ICD-10-CM | POA: Diagnosis not present

## 2017-03-19 DIAGNOSIS — R296 Repeated falls: Secondary | ICD-10-CM | POA: Diagnosis not present

## 2017-03-19 DIAGNOSIS — J90 Pleural effusion, not elsewhere classified: Secondary | ICD-10-CM | POA: Diagnosis not present

## 2017-03-19 MED ORDER — IOPAMIDOL (ISOVUE-370) INJECTION 76%
75.0000 mL | Freq: Once | INTRAVENOUS | Status: AC | PRN
  Administered 2017-03-19: 75 mL via INTRAVENOUS

## 2017-03-21 ENCOUNTER — Ambulatory Visit
Admission: RE | Admit: 2017-03-21 | Discharge: 2017-03-21 | Disposition: A | Discharge status: Home / Self Care | Payer: Medicare Other | Source: Ambulatory Visit | Attending: Specialist | Admitting: Specialist

## 2017-03-21 ENCOUNTER — Other Ambulatory Visit: Payer: Self-pay | Admitting: Specialist

## 2017-03-21 DIAGNOSIS — R600 Localized edema: Secondary | ICD-10-CM | POA: Insufficient documentation

## 2017-03-21 DIAGNOSIS — I824Z3 Acute embolism and thrombosis of unspecified deep veins of distal lower extremity, bilateral: Secondary | ICD-10-CM | POA: Insufficient documentation

## 2017-03-21 DIAGNOSIS — M7989 Other specified soft tissue disorders: Secondary | ICD-10-CM

## 2017-03-21 DIAGNOSIS — I82403 Acute embolism and thrombosis of unspecified deep veins of lower extremity, bilateral: Secondary | ICD-10-CM | POA: Diagnosis not present

## 2017-03-21 DIAGNOSIS — J90 Pleural effusion, not elsewhere classified: Secondary | ICD-10-CM | POA: Diagnosis not present

## 2017-03-22 ENCOUNTER — Encounter (INDEPENDENT_AMBULATORY_CARE_PROVIDER_SITE_OTHER): Payer: Self-pay | Admitting: Vascular Surgery

## 2017-03-22 ENCOUNTER — Ambulatory Visit (INDEPENDENT_AMBULATORY_CARE_PROVIDER_SITE_OTHER): Payer: Medicare Other | Admitting: Vascular Surgery

## 2017-03-22 VITALS — BP 118/70 | HR 75 | Resp 13 | Ht 59.0 in | Wt 110.0 lb

## 2017-03-22 DIAGNOSIS — I82443 Acute embolism and thrombosis of tibial vein, bilateral: Secondary | ICD-10-CM

## 2017-03-22 DIAGNOSIS — I10 Essential (primary) hypertension: Secondary | ICD-10-CM

## 2017-03-22 DIAGNOSIS — R296 Repeated falls: Secondary | ICD-10-CM | POA: Diagnosis not present

## 2017-03-22 DIAGNOSIS — I6529 Occlusion and stenosis of unspecified carotid artery: Secondary | ICD-10-CM | POA: Diagnosis not present

## 2017-03-22 DIAGNOSIS — E785 Hyperlipidemia, unspecified: Secondary | ICD-10-CM | POA: Diagnosis not present

## 2017-03-22 DIAGNOSIS — M7989 Other specified soft tissue disorders: Secondary | ICD-10-CM | POA: Diagnosis not present

## 2017-03-22 DIAGNOSIS — I82409 Acute embolism and thrombosis of unspecified deep veins of unspecified lower extremity: Secondary | ICD-10-CM | POA: Insufficient documentation

## 2017-03-22 NOTE — Assessment & Plan Note (Signed)
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 All of the measures that family has already taken have helped significantly and I would add a low-level compression stocking as well. I'll see her back in a couple of months to assess her status.

## 2017-03-22 NOTE — Assessment & Plan Note (Signed)
blood pressure control important in reducing the progression of atherosclerotic disease. On appropriate oral medications.  

## 2017-03-22 NOTE — Patient Instructions (Signed)
Deep Vein Thrombosis A deep vein thrombosis (DVT) is a blood clot (thrombus) that usually occurs in a deep, larger vein of the lower leg or the pelvis, or in an upper extremity such as the arm. These are dangerous and can lead to serious and even life-threatening complications if the clot travels to the lungs. A DVT can damage the valves in your leg veins so that instead of flowing upward, the blood pools in the lower leg. This is called post-thrombotic syndrome, and it can result in pain, swelling, discoloration, and sores on the leg. What are the causes? A DVT is caused by the formation of a blood clot in your leg, pelvis, or arm. Usually, several things contribute to the formation of blood clots. A clot may develop when:  Your blood flow slows down.  Your vein becomes damaged in some way.  You have a condition that makes your blood clot more easily.  What increases the risk? A DVT is more likely to develop in:  People who are older, especially over 60 years of age.  People who are overweight (obese).  People who sit or lie still for a long time, such as during long-distance travel (over 4 hours), bed rest, hospitalization, or during recovery from certain medical conditions like a stroke.  People who do not engage in much physical activity (sedentary lifestyle).  People who have chronic breathing disorders.  People who have a personal or family history of blood clots or blood clotting disease.  People who have peripheral vascular disease (PVD), diabetes, or some types of cancer.  People who have heart disease, especially if the person had a recent heart attack or has congestive heart failure.  People who have neurological diseases that affect the legs (leg paresis).  People who have had a traumatic injury, such as breaking a hip or leg.  People who have recently had major or lengthy surgery, especially on the hip, knee, or abdomen.  People who have had a central line placed  inside a large vein.  People who take medicines that contain the hormone estrogen. These include birth control pills and hormone replacement therapy.  Pregnancy or during childbirth or the postpartum period.  Long plane flights (over 8 hours).  What are the signs or symptoms?  Symptoms of a DVT can include:  Swelling of your leg or arm, especially if one side is much worse.  Warmth and redness of your leg or arm, especially if one side is much worse.  Pain in your arm or leg. If the clot is in your leg, symptoms may be more noticeable or worse when you stand or walk.  A feeling of pins and needles, if the clot is in the arm.  The symptoms of a DVT that has traveled to the lungs (pulmonary embolism, PE) usually start suddenly and include:  Shortness of breath while active or at rest.  Coughing or coughing up blood or blood-tinged mucus.  Chest pain that is often worse with deep breaths.  Rapid or irregular heartbeat.  Feeling light-headed or dizzy.  Fainting.  Feeling anxious.  Sweating.  There may also be pain and swelling in a leg if that is where the blood clot started. These symptoms may represent a serious problem that is an emergency. Do not wait to see if the symptoms will go away. Get medical help right away. Call your local emergency services (911 in the [place] Do not drive yourself to the hospital. How is this diagnosed? Your health   care provider will take a medical history and perform a physical exam. You may also have other tests, including:  Blood tests to assess the clotting properties of your blood.  Imaging tests, such as CT, ultrasound, MRI, X-ray, and other tests to see if you have clots anywhere in your body.  How is this treated? After a DVT is identified, it can be treated. The type of treatment that you receive depends on many factors, such as the cause of your DVT, your risk for bleeding or developing more clots, and other medical conditions that  you have. Sometimes, a combination of treatments is necessary. Treatment options may be combined and include:  Monitoring the blood clot with ultrasound.  Taking medicines by mouth, such as newer blood thinners (anticoagulants), thrombolytics, or warfarin.  Taking anticoagulant medicine by injection or through an IV tube.  Wearing compression stockings or using different types ofdevices.  Surgery (rare) to remove the blood clot or to place a filter in your abdomen to stop the blood clot from traveling to your lungs.  Treatments for a DVT are often divided into immediate treatment and long-term treatment (up to 3 months after DVT). You can work with your health care provider to choose the treatment program that is best for you. Follow these instructions at home: If you are taking a newer oral anticoagulant:  Take the medicine every single day at the same time each day.  Understand what foods and drugs interact with this medicine.  Understand that there are no regular blood tests required when using this medicine.  Understand the side effects of this medicine, including excessive bruising or bleeding. Ask your health care provider or pharmacist about other possible side effects. If you are taking warfarin:  Understand how to take warfarin and know which foods can affect how warfarin works in your body.  Understand that it is dangerous to take too much or too little warfarin. Too much warfarin increases the risk of bleeding. Too little warfarin continues to allow the risk for blood clots.  Follow your PT and INR blood testing schedule. The PT and INR results allow your health care provider to adjust your dose of warfarin. It is very important that you have your PT and INR tested as often as told by your health care provider.  Avoid major changes in your diet, or tell your health care provider before you change your diet. Arrange a visit with a registered dietitian to answer your  questions. Many foods, especially foods that are high in vitamin K, can interfere with warfarin and affect the PT and INR results. Eat a consistent amount of foods that are high in vitamin K, such as: ? Spinach, kale, broccoli, cabbage, collard greens, turnip greens, Brussels sprouts, peas, cauliflower, seaweed, and parsley. ? Beef liver and pork liver. ? Green tea. ? Soybean oil.  Tell your health care provider about any and all medicines, vitamins, and supplements that you take, including aspirin and other over-the-counter anti-inflammatory medicines. Be especially cautious with aspirin and anti-inflammatory medicines. Do not take those before you ask your health care provider if it is safe to do so. This is important because many medicines can interfere with warfarin and affect the PT and INR results.  Do not start or stop taking any over-the-counter or prescription medicine unless your health care provider or pharmacist tells you to do so. If you take warfarin, you will also need to do these things:  Hold pressure over cuts for longer than   usual.  Tell your dentist and other health care providers that you are taking warfarin before you have any procedures in which bleeding may occur.  Avoid alcohol or drink very small amounts. Tell your health care provider if you change your alcohol intake.  Do not use tobacco products, including cigarettes, chewing tobacco, and e-cigarettes. If you need help quitting, ask your health care provider.  Avoid contact sports.  General instructions  Take over-the-counter and prescription medicines only as told by your health care provider. Anticoagulant medicines can have side effects, including easy bruising and difficulty stopping bleeding. If you are prescribed an anticoagulant, you will also need to do these things: ? Hold pressure over cuts for longer than usual. ? Tell your dentist and other health care providers that you are taking anticoagulants  before you have any procedures in which bleeding may occur. ? Avoid contact sports.  Wear a medical alert bracelet or carry a medical alert card that says you have had a PE.  Ask your health care provider how soon you can go back to your normal activities. Stay active to prevent new blood clots from forming.  Make sure to exercise while traveling or when you have been sitting or standing for a long period of time. It is very important to exercise. Exercise your legs by walking or by tightening and relaxing your leg muscles often. Take frequent walks.  Wear compression stockings as told by your health care provider to help prevent more blood clots from forming.  Do not use tobacco products, including cigarettes, chewing tobacco, and e-cigarettes. If you need help quitting, ask your health care provider.  Keep all follow-up appointments with your health care provider. This is important. How is this prevented? Take these actions to decrease your risk of developing another DVT:  Exercise regularly. For at least 30 minutes every day, engage in: ? Activity that involves moving your arms and legs. ? Activity that encourages good blood flow through your body by increasing your heart rate.  Exercise your arms and legs every hour during long-distance travel (over 4 hours). Drink plenty of water and avoid drinking alcohol while traveling.  Avoid sitting or lying in bed for long periods of time without moving your legs.  Maintain a weight that is appropriate for your height. Ask your health care provider what weight is healthy for you.  If you are a woman who is over 35 years of age, avoid unnecessary use of medicines that contain estrogen. These include birth control pills.  Do not smoke, especially if you take estrogen medicines. If you need help quitting, ask your health care provider.  If you are hospitalized, prevention measures may include:  Early walking after surgery, as soon as your  health care provider says that it is safe.  Receiving anticoagulants to prevent blood clots.If you cannot take anticoagulants, other options may be available, such as wearing compression stockings or using different types of devices.  Get help right away if:  You have new or increased pain, swelling, or redness in an arm or leg.  You have numbness or tingling in an arm or leg.  You have shortness of breath while active or at rest.  You have chest pain.  You have a rapid or irregular heartbeat.  You feel light-headed or dizzy.  You cough up blood.  You notice blood in your vomit, bowel movement, or urine. These symptoms may represent a serious problem that is an emergency. Do not wait to see   if the symptoms will go away. Get medical help right away. Call your local emergency services (911 in the [place] Do not drive yourself to the hospital. This information is not intended to replace advice given to you by your health care provider. Make sure you discuss any questions you have with your health care provider. Document Released: 08/06/2005 Document Revised: 01/12/2016 Document Reviewed: 12/01/2014 [employer] Interactive Patient Education  2017 [employer]  

## 2017-03-22 NOTE — Assessment & Plan Note (Signed)
lipid control important in reducing the progression of atherosclerotic disease. Continue statin therapy  

## 2017-03-22 NOTE — Progress Notes (Signed)
MRN : [id number]  Lorraine Fischer is a 83 y.o. (09/20/1922) female who presents with chief complaint of  Chief Complaint  Patient presents with  . Re-evaluation    ADD ON leg swelling, blod clots  .  History of Present Illness: Patient returns today in follow up on referral from Dr. Raul Del. For several weeks, she has had worsening leg swelling. Both lower extremities are affected. Her daughter that accompanies her today says that the swelling is actually about 60% better than it was a few weeks ago. This is attributed largely to increasing her Lasix dose to 40 mg daily, removing a large amount of salt from her diet, and making her walk a lot more. This has helped quite a lot.  She had a venous duplex recently and a CT angiogram of the chest. She had what was described as a very small pulmonary embolus present. 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  Current Outpatient Prescriptions  Medication Sig Dispense Refill  . amLODipine (NORVASC) 2.5 MG tablet Take 1 tablet (2.5 mg total) by mouth daily. 90 tablet 2  . aspirin 81 MG chewable tablet Chew 81 mg by mouth daily.    . Cranberry 425 MG CAPS Take 1 capsule by mouth daily.    . furosemide (LASIX) 20 MG tablet Take 1 tablet (20 mg total) by mouth daily. (Patient taking differently: Take 40 mg by mouth daily. ) 30 tablet 3  . levothyroxine (SYNTHROID, LEVOTHROID) 50 MCG tablet Take 1 tablet (50 mcg total) by mouth daily before breakfast. 90 tablet 2  . lisinopril (PRINIVIL,ZESTRIL) 5 MG tablet Take 1 tablet (5 mg total) by mouth daily. 90 tablet 2  . lovastatin (MEVACOR) 40 MG tablet Take 1 tablet (40 mg total) by mouth daily. 90 tablet 2  . Melatonin 3 MG TBDP Take 1 tablet by mouth at bedtime.    . metFORMIN (GLUCOPHAGE) 500 MG tablet TAKE ONE TABLET BY MOUTH ONCE DAILY WITH BREAKFAT 90 tablet 2  . metoprolol succinate (TOPROL-XL) 50 MG 24 hr tablet Take 1 tablet (50 mg total) by mouth daily. Take with or  immediately following a meal. 90 tablet 2  . nystatin cream (MYCOSTATIN) Apply 1 application topically 2 (two) times daily. 30 g 0   No current facility-administered medications for this visit.     Past Medical History:  Diagnosis Date  . Diabetes mellitus without complication ([place])   . Hyperlipidemia   . Hypertension   . Thyroid disease     Past Surgical History:  Procedure Laterality Date  . ANKLE SURGERY    . HIP FRACTURE SURGERY    . WRIST SURGERY Right     Social History Social History  Substance Use Topics  . Smoking status: Never Smoker  . Smokeless tobacco: Never Used  . Alcohol use No  No IVDU  Family History No bleeding disorders, clotting disorders, autoimmune diseases, or aneurysms  Allergies  Allergen Reactions  . Sulfa Antibiotics Shortness Of Breath  . Sulfasalazine Shortness Of Breath     REVIEW OF SYSTEMS (Negative unless checked)  Constitutional: [] Weight loss  [] Fever  [] Chills Cardiac: [] Chest pain   [] Chest pressure   [] Palpitations   [] Shortness of breath when laying flat   [] Shortness of breath at rest   [] Shortness of breath with exertion. Vascular:  [] Pain in legs with walking   [] Pain in legs at rest   [] Pain in legs when laying flat   [] Claudication   [] Pain in feet  when walking  [] Pain in feet at rest  [] Pain in feet when laying flat   [x] History of DVT   [] Phlebitis   [x] Swelling in legs   [] Varicose veins   [] Non-healing ulcers Pulmonary:   [] Uses home oxygen   [] Productive cough   [] Hemoptysis   [] Wheeze  [] COPD   [] Asthma Neurologic:  [x] Dizziness  [] Blackouts   [] Seizures   [] History of stroke   [] History of TIA  [] Aphasia   [] Temporary blindness   [] Dysphagia   [] Weakness or numbness in arms   [] Weakness or numbness in legs Musculoskeletal:  [x] Arthritis   [] Joint swelling   [] Joint pain   [x] Low back pain Hematologic:  [] Easy bruising  [] Easy bleeding   [] Hypercoagulable state   [] Anemic   Gastrointestinal:  [] Blood in stool    [] Vomiting blood  [] Gastroesophageal reflux/heartburn   [] Abdominal pain Genitourinary:  [] Chronic kidney disease   [] Difficult urination  [] Frequent urination  [] Burning with urination   [] Hematuria Skin:  [] Rashes   [] Ulcers   [] Wounds Psychological:  [] History of anxiety   []  History of major depression.  Physical Examination  BP 118/70 (BP Location: Right Arm)   Pulse 75   Resp 13   Ht 4\' 11"  (1.499 m)   Wt 110 lb (49.9 kg)   BMI 22.22 kg/m  Gen:  WD/WN, NAD Head: [place]/AT, No temporalis wasting. Ear/Nose/Throat: Hearing grossly intact, nares w/o erythema or drainage, trachea midline Eyes: Conjunctiva clear. Sclera non-icteric Neck: Supple.  No JVD.  Pulmonary:  Good air movement, no use of accessory muscles.  Cardiac: irregular Vascular:  Vessel Right Left  Radial Palpable Palpable                          PT Not Palpable Not Palpable  DP 1+ Palpable 1+ Palpable   Musculoskeletal: M/S 5/5 throughout.  No deformity or atrophy. 2+ BLE edema. Neurologic: Sensation grossly intact in extremities.  Symmetrical.  Speech is fluent.  Psychiatric: Judgment and insight are fair.  Not a great historian Dermatologic: No rashes or ulcers noted.  No cellulitis or open wounds. Lymph : No Cervical, Axillary, or Inguinal lymphadenopathy.      Labs Recent Results (from the past 2160 hour(s))  TSH     Status: Abnormal   Collection Time: 02/27/17 10:29 AM  Result Value Ref Range   TSH 4.690 (H) 0.450 - 4.500 uIU/mL  Lipid Profile     Status: None   Collection Time: 02/27/17 10:29 AM  Result Value Ref Range   Cholesterol, Total 162 100 - 199 mg/dL   Triglycerides 81 0 - 149 mg/dL   HDL 48 >39 mg/dL   VLDL Cholesterol Cal 16 5 - 40 mg/dL   LDL Calculated 98 0 - 99 mg/dL   Chol/HDL Ratio 3.4 0.0 - 4.4 ratio    Comment:                                   T. Chol/HDL Ratio                                             Men  Women  1/2 Avg.Risk  3.4     3.3                                   Avg.Risk  5.0    4.4                                2X Avg.Risk  9.6    7.1                                3X Avg.Risk 23.4   11.0   Renal Function Panel     Status: Abnormal   Collection Time: 02/27/17 10:29 AM  Result Value Ref Range   Glucose 111 (H) 65 - 99 mg/dL   BUN 15 10 - 36 mg/dL   Creatinine, Ser 0.91 0.57 - 1.00 mg/dL   GFR calc non Af Amer 55 (L) >59 mL/min/1.73   GFR calc Af Amer 63 >59 mL/min/1.73   BUN/Creatinine Ratio 16 12 - 28   Sodium 134 134 - 144 mmol/L   Potassium 5.4 (H) 3.5 - 5.2 mmol/L   Chloride 99 96 - 106 mmol/L   CO2 19 (L) 20 - 29 mmol/L   Calcium 9.2 8.7 - 10.3 mg/dL   Phosphorus 2.9 2.5 - 4.5 mg/dL   Albumin 3.4 3.2 - 4.6 g/dL    Radiology Dg Chest 2 View  Result Date: 03/06/2017 CLINICAL DATA:  Follow-up pneumonia. EXAM: CHEST  2 VIEW COMPARISON:  02/27/2017 .  11/05/2016 . FINDINGS: Mediastinum hilar structures normal. Heart size stable. Persistent bibasilar atelectasis and infiltrates. Persistent small bilateral pleural effusions. No interim change from prior exam.Aortic atherosclerotic vascular calcification noted. Left rib fractures again noted. Thoracolumbar spine scoliosis again noted. IMPRESSION: 1. Persistent bibasilar atelectasis and infiltrates and persistent bilateral pleural effusions. No interim change from prior exam. 2. Aortic atherosclerotic vascular disease. Electronically Signed   By: Marcello Moores  Register   On: 03/06/2017 13:30   Dg Ribs Unilateral W/chest Left  Result Date: 02/27/2017 CLINICAL DATA:  Shortness of breath, left rib pain after falls. EXAM: LEFT RIBS AND CHEST - 3+ VIEW COMPARISON:  Radiographs November 05, 2016. FINDINGS: Stable cardiomediastinal silhouette. Atherosclerosis of thoracic aorta is noted. No pneumothorax is noted. Large bibasilar opacities are noted concerning for pneumonia or atelectasis with associated pleural effusions. Multiple old left rib fractures are noted. No acute  fracture is noted. IMPRESSION: Aortic atherosclerosis. Interval development of large bibasilar opacities concerning for pneumonia or atelectasis with associated pleural effusions. Multiple old left rib fractures are noted. No acute fracture is noted. Electronically Signed   By: Marijo Conception, M.D.   On: 02/27/2017 12:42   Ct Angio Chest Pe W Or Wo Contrast  Result Date: 03/19/2017 CLINICAL DATA:  83 year old female fell several weeks ago and broke sternum. Fell 1 week ago hurting sternum again. Difficulty breathing. Subsequent encounter. EXAM: CT ANGIOGRAPHY CHEST WITH CONTRAST TECHNIQUE: Multidetector CT imaging of the chest was performed using the standard protocol during bolus administration of intravenous contrast. Multiplanar CT image reconstructions and MIPs were obtained to evaluate the vascular anatomy. CONTRAST:  75 cc Isovue 370. COMPARISON:  03/06/2017 chest x-ray.  11/05/2016 cervical spine CT. FINDINGS: Cardiovascular: Very small right lower lobe pulmonary embolus suspected. No large central pulmonary embolus. Prominent atherosclerotic changes thoracic aorta and great vessels.  Exam not optimized to evaluate arterial structures. Cardiomegaly without right ventricular dominance. Coronary artery calcifications. Mediastinum/Nodes: Top-normal size lymph nodes. Lungs/Pleura: Moderately large bilateral pleural effusions. Lower lobe consolidation probably represents passive atelectasis although infiltrate could not be excluded in the proper clinical setting. Trachea and mainstem bronchi are patent. No pneumothorax. Upper Abdomen: No acute abnormality. Musculoskeletal: Subacute upper sternal fracture with slight buckling. T1 anterior wedge compression fracture noted on the 11/05/2016 exam. There has been further slight loss of height mild sclerosis without retropulsion. No new thoracic compression fracture. Mild thoracic kyphosis. Multiple remote left-sided rib fractures. Third spacing of fluid. Review of  the MIP images confirms the above findings. IMPRESSION: Very small right lower lobe pulmonary embolus suspected. No large central pulmonary embolus. Cardiomegaly without right ventricular dominance. Prominent coronary artery calcifications. Moderately large bilateral pleural effusions. Lower lobe consolidation probably represents passive atelectasis although infiltrate could not be excluded in the proper clinical setting. Subacute upper sternal fracture with slight buckling. T1 anterior wedge compression fracture noted on the 11/05/2016 exam. There has been further slight loss of height mild sclerosis without retropulsion. No new thoracic compression fracture. Multiple remote left-sided rib fractures. Aortic Atherosclerosis (ICD10-I70.0). These results were called by telephone at the time of interpretation on 03/19/2017 at 4:28 pm to [facility] who verbally acknowledged these results. Electronically Signed   By: Genia Del M.D.   On: 03/19/2017 16:27   US Venous Img Lower Bilateral  Result Date: 03/21/2017 CLINICAL DATA:  Bilateral leg swelling over the last 4 days. EXAM: BILATERAL LOWER EXTREMITY VENOUS DOPPLER ULTRASOUND TECHNIQUE: Gray-scale sonography with graded compression, as well as color Doppler and duplex ultrasound were performed to evaluate the lower extremity deep venous systems from the level of the common femoral vein and including the common femoral, femoral, profunda femoral, popliteal and calf veins including the posterior tibial, peroneal and gastrocnemius veins when visible. The superficial great saphenous vein was also interrogated. Spectral Doppler was utilized to evaluate flow at rest and with distal augmentation maneuvers in the common femoral, femoral and popliteal veins. COMPARISON:  None. FINDINGS: RIGHT LOWER EXTREMITY Common Femoral Vein: No evidence of thrombus. Normal compressibility, respiratory phasicity and response to augmentation. Saphenofemoral Junction: No evidence of  thrombus. Normal compressibility and flow on color Doppler imaging. Profunda Femoral Vein: No evidence of thrombus. Normal compressibility and flow on color Doppler imaging. Femoral Vein: No evidence of thrombus. Normal compressibility, respiratory phasicity and response to augmentation. Popliteal Vein: No evidence of thrombus. Normal compressibility, respiratory phasicity and response to augmentation. Calf Veins: Posterior tibial veins are not seen. Peroneal veins show thrombosis. Superficial Great Saphenous Vein: No evidence of thrombus. Normal compressibility and flow on color Doppler imaging. Venous Reflux:  None. Other Findings:  None. LEFT LOWER EXTREMITY Common Femoral Vein: No evidence of thrombus. Normal compressibility, respiratory phasicity and response to augmentation. Saphenofemoral Junction: No evidence of thrombus. Normal compressibility and flow on color Doppler imaging. Profunda Femoral Vein: No evidence of thrombus. Normal compressibility and flow on color Doppler imaging. Femoral Vein: No evidence of thrombus. Normal compressibility, respiratory phasicity and response to augmentation. Popliteal Vein: No evidence of thrombus. Normal compressibility, respiratory phasicity and response to augmentation. Calf Veins: Posterior tibial veins not visualized. Peroneal veins show thrombosis. Superficial Great Saphenous Vein: No evidence of thrombus. Normal compressibility and flow on color Doppler imaging. Venous Reflux:  None. Other Findings:  None. IMPRESSION: The examination shows the somewhat unusual finding of bilateral calf vein thrombosis affecting the peroneal veins. No venous thrombosis  proximal to that on either side. Electronically Signed   By: Nelson Chimes M.D.   On: 03/21/2017 15:40   Dg Humerus Right  Result Date: 02/27/2017 CLINICAL DATA:  Right proximal humerus pain post fall. EXAM: RIGHT HUMERUS - 2+ VIEW COMPARISON:  None. FINDINGS: There is no evidence of fracture or other focal bone  lesions. Soft tissues are unremarkable. IMPRESSION: Negative. Electronically Signed   By: Fidela Salisbury M.D.   On: 02/27/2017 12:40      Assessment/Plan  Frequent falls Not a great candidate for anticoagulation secondary to this.  Hyperlipidemia lipid control important in reducing the progression of atherosclerotic disease. Continue statin therapy   Essential hypertension blood pressure control important in reducing the progression of atherosclerotic disease. On appropriate oral medications.   Carotid stenosis Did well after right carotid endarterectomy in the past. No recent worrisome symptoms.  Swelling of limb This is likely multifactorial, but certainly tibial vein DVT is at least a contributing factor. All of the measures that family has already taken have helped significantly and I would add a low-level compression stocking as well. I'll see her back in a couple of months to assess her status.  DVT (deep venous thrombosis) ([place]) She had a venous duplex recently and a CT angiogram of the chest. She had what was described as a very small pulmonary embolus present. 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 I had a long discussion today with the family regarding treatment options. Given her advanced age and her frequent falls, she is a poor candidate for anticoagulation. All the conservative measures they are already doing should be continued, and I would add a low-level compression stocking. I do not think an IVC filter is all that helpful particularly with only tibial vein DVT currently remaining. We are going to add Plavix to try to increase her blood thinning affect with minimal bleeding risk. The family is in agreement with our plan of care and I will see her back in a month or 2.    Leotis Pain, MD  03/22/2017 12:57 PM    This note was created with Dragon medical transcription system.  Any errors from dictation are purely unintentional

## 2017-03-22 NOTE — Assessment & Plan Note (Signed)
Did well after right carotid endarterectomy in the past. No recent worrisome symptoms.

## 2017-03-22 NOTE — Assessment & Plan Note (Signed)
She had a venous duplex recently and a CT angiogram of the chest. She had what was described as a very small pulmonary embolus present. 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 I had a long discussion today with the family regarding treatment options. Given her advanced age and her frequent falls, she is a poor candidate for anticoagulation. All the conservative measures they are already doing should be continued, and I would add a low-level compression stocking. I do not think an IVC filter is all that helpful particularly with only tibial vein DVT currently remaining. We are going to add Plavix to try to increase her blood thinning affect with minimal bleeding risk. The family is in agreement with our plan of care and I will see her back in a month or 2.

## 2017-03-22 NOTE — Assessment & Plan Note (Signed)
Not a great candidate for anticoagulation secondary to this.

## 2017-03-25 DIAGNOSIS — I739 Peripheral vascular disease, unspecified: Secondary | ICD-10-CM | POA: Diagnosis not present

## 2017-03-25 DIAGNOSIS — B351 Tinea unguium: Secondary | ICD-10-CM | POA: Diagnosis not present

## 2017-03-25 DIAGNOSIS — M79675 Pain in left toe(s): Secondary | ICD-10-CM | POA: Diagnosis not present

## 2017-03-25 DIAGNOSIS — D2372 Other benign neoplasm of skin of left lower limb, including hip: Secondary | ICD-10-CM | POA: Diagnosis not present

## 2017-03-25 DIAGNOSIS — E1151 Type 2 diabetes mellitus with diabetic peripheral angiopathy without gangrene: Secondary | ICD-10-CM | POA: Diagnosis not present

## 2017-03-28 DIAGNOSIS — I493 Ventricular premature depolarization: Secondary | ICD-10-CM | POA: Diagnosis not present

## 2017-03-28 DIAGNOSIS — I10 Essential (primary) hypertension: Secondary | ICD-10-CM | POA: Diagnosis not present

## 2017-03-28 DIAGNOSIS — R002 Palpitations: Secondary | ICD-10-CM | POA: Diagnosis not present

## 2017-04-04 ENCOUNTER — Ambulatory Visit (INDEPENDENT_AMBULATORY_CARE_PROVIDER_SITE_OTHER): Payer: Medicare Other | Admitting: Family Medicine

## 2017-04-04 ENCOUNTER — Ambulatory Visit
Admission: RE | Admit: 2017-04-04 | Discharge: 2017-04-04 | Disposition: A | Discharge status: Home / Self Care | Payer: Medicare Other | Source: Ambulatory Visit | Attending: Family Medicine | Admitting: Family Medicine

## 2017-04-04 ENCOUNTER — Encounter: Payer: Self-pay | Admitting: Family Medicine

## 2017-04-04 VITALS — BP 110/80 | HR 80 | Ht 59.0 in | Wt 110.0 lb

## 2017-04-04 DIAGNOSIS — I700 Atherosclerosis of aorta: Secondary | ICD-10-CM | POA: Diagnosis not present

## 2017-04-04 DIAGNOSIS — I2510 Atherosclerotic heart disease of native coronary artery without angina pectoris: Secondary | ICD-10-CM | POA: Insufficient documentation

## 2017-04-04 DIAGNOSIS — J9811 Atelectasis: Secondary | ICD-10-CM

## 2017-04-04 NOTE — Progress Notes (Signed)
Name: Lorraine Fischer   MRN: [id number]    DOB: [birth year]   Date:04/04/2017       Progress Note  Subjective  Chief Complaint  Chief Complaint  Patient presents with  . Follow-up    atelectasis on chest xray    Shortness of Breath  This is a recurrent problem. The current episode started more than 1 year ago. The problem occurs intermittently. The problem has been waxing and waning. Pertinent negatives include no abdominal pain, chest pain, claudication, coryza, ear pain, fever, headaches, hemoptysis, leg pain, leg swelling, neck pain, orthopnea, PND, rash, rhinorrhea, sore throat, sputum production, swollen glands, syncope, vomiting or wheezing. Exacerbated by: sodium. The patient has no known risk factors for DVT/PE. She has tried nothing for the symptoms.    No problem-specific Assessment & Plan notes found for this encounter.   Past Medical History:  Diagnosis Date  . Diabetes mellitus without complication ([place])   . Hyperlipidemia   . Hypertension   . Thyroid disease     Past Surgical History:  Procedure Laterality Date  . ANKLE SURGERY    . HIP FRACTURE SURGERY    . WRIST SURGERY Right     No family history on file.  Social History   Social History  . Marital status: Widowed    Spouse name: N/A  . Number of children: N/A  . Years of education: N/A   Occupational History  . Not on file.   Social History Main Topics  . Smoking status: Never Smoker  . Smokeless tobacco: Never Used  . Alcohol use No  . Drug use: No  . Sexual activity: Not Currently   Other Topics Concern  . Not on file   Social History Narrative  . No narrative on file    Allergies  Allergen Reactions  . Sulfa Antibiotics Shortness Of Breath  . Sulfasalazine Shortness Of Breath    Outpatient Medications Prior to Visit  Medication Sig Dispense Refill  . amLODipine (NORVASC) 2.5 MG tablet Take 1 tablet (2.5 mg total) by mouth daily. 90 tablet 2  . aspirin 81 MG chewable tablet Chew  81 mg by mouth daily.    . Cranberry 425 MG CAPS Take 1 capsule by mouth daily.    . furosemide (LASIX) 20 MG tablet Take 1 tablet (20 mg total) by mouth daily. (Patient taking differently: Take 40 mg by mouth daily. ) 30 tablet 3  . levothyroxine (SYNTHROID, LEVOTHROID) 50 MCG tablet Take 1 tablet (50 mcg total) by mouth daily before breakfast. 90 tablet 2  . lisinopril (PRINIVIL,ZESTRIL) 5 MG tablet Take 1 tablet (5 mg total) by mouth daily. 90 tablet 2  . lovastatin (MEVACOR) 40 MG tablet Take 1 tablet (40 mg total) by mouth daily. 90 tablet 2  . Melatonin 3 MG TBDP Take 1 tablet by mouth at bedtime.    . metFORMIN (GLUCOPHAGE) 500 MG tablet TAKE ONE TABLET BY MOUTH ONCE DAILY WITH BREAKFAT 90 tablet 2  . metoprolol succinate (TOPROL-XL) 50 MG 24 hr tablet Take 1 tablet (50 mg total) by mouth daily. Take with or immediately following a meal. 90 tablet 2  . nystatin cream (MYCOSTATIN) Apply 1 application topically 2 (two) times daily. 30 g 0   No facility-administered medications prior to visit.     Review of Systems  Constitutional: Negative for chills, fever, malaise/fatigue and weight loss.  HENT: Negative for ear discharge, ear pain, rhinorrhea and sore throat.   Eyes: Negative for blurred vision.  Respiratory: Positive for shortness of breath. Negative for cough, hemoptysis, sputum production and wheezing.   Cardiovascular: Negative for chest pain, palpitations, orthopnea, claudication, leg swelling, syncope and PND.  Gastrointestinal: Negative for abdominal pain, blood in stool, constipation, diarrhea, heartburn, melena, nausea and vomiting.  Genitourinary: Negative for dysuria, frequency, hematuria and urgency.  Musculoskeletal: Negative for back pain, joint pain, myalgias and neck pain.  Skin: Negative for rash.  Neurological: Negative for dizziness, tingling, sensory change, focal weakness and headaches.  Endo/Heme/Allergies: Negative for environmental allergies and polydipsia.  Does not bruise/bleed easily.  Psychiatric/Behavioral: Negative for depression and suicidal ideas. The patient is not nervous/anxious and does not have insomnia.      Objective  Vitals:   04/04/17 0827  BP: 110/80  Pulse: 80  Weight: 110 lb (49.9 kg)  Height: 4\' 11"  (1.499 m)    Physical Exam  Constitutional: She is well-developed, well-nourished, and in no distress. No distress.  HENT:  Head: Normocephalic and atraumatic.  Right Ear: External ear normal.  Left Ear: External ear normal.  Nose: Nose normal.  Mouth/Throat: Oropharynx is clear and moist.  Eyes: Pupils are equal, round, and reactive to light. Conjunctivae and EOM are normal. Right eye exhibits no discharge. Left eye exhibits no discharge.  Neck: Normal range of motion. Neck supple. No JVD present. No thyromegaly present.  Cardiovascular: Normal rate, regular rhythm, normal heart sounds and intact distal pulses.  Exam reveals no gallop and no friction rub.   No murmur heard. Pulmonary/Chest: Effort normal and breath sounds normal. No respiratory distress. She has no wheezes. She has no rales.  Abdominal: Soft. Bowel sounds are normal. She exhibits no mass. There is no tenderness. There is no guarding.  Musculoskeletal: Normal range of motion. She exhibits no edema.  Lymphadenopathy:    She has no cervical adenopathy.  Neurological: She is alert. She has normal reflexes.  Skin: Skin is warm and dry. She is not diaphoretic.  Psychiatric: Mood and affect normal.  Nursing note and vitals reviewed.     Assessment & Plan  Problem List Items Addressed This Visit    None    Visit Diagnoses    Atelectasis    -  Primary   Relevant Orders   DG Chest 2 View (Completed)      No orders of the defined types were placed in this encounter.     Dr. Macon [facility]  04/04/17

## 2017-04-09 ENCOUNTER — Ambulatory Visit (INDEPENDENT_AMBULATORY_CARE_PROVIDER_SITE_OTHER): Payer: Medicare Other | Admitting: Vascular Surgery

## 2017-04-18 DIAGNOSIS — R0609 Other forms of dyspnea: Secondary | ICD-10-CM | POA: Diagnosis not present

## 2017-04-25 DIAGNOSIS — R002 Palpitations: Secondary | ICD-10-CM | POA: Diagnosis not present

## 2017-04-25 DIAGNOSIS — M79671 Pain in right foot: Secondary | ICD-10-CM | POA: Diagnosis not present

## 2017-04-25 DIAGNOSIS — I10 Essential (primary) hypertension: Secondary | ICD-10-CM | POA: Diagnosis not present

## 2017-04-25 DIAGNOSIS — I493 Ventricular premature depolarization: Secondary | ICD-10-CM | POA: Diagnosis not present

## 2017-04-25 DIAGNOSIS — R0602 Shortness of breath: Secondary | ICD-10-CM | POA: Diagnosis not present

## 2017-05-01 DIAGNOSIS — M79675 Pain in left toe(s): Secondary | ICD-10-CM | POA: Diagnosis not present

## 2017-05-01 DIAGNOSIS — I739 Peripheral vascular disease, unspecified: Secondary | ICD-10-CM | POA: Diagnosis not present

## 2017-05-01 DIAGNOSIS — L97521 Non-pressure chronic ulcer of other part of left foot limited to breakdown of skin: Secondary | ICD-10-CM | POA: Diagnosis not present

## 2017-05-01 DIAGNOSIS — E1151 Type 2 diabetes mellitus with diabetic peripheral angiopathy without gangrene: Secondary | ICD-10-CM | POA: Diagnosis not present

## 2017-05-24 ENCOUNTER — Ambulatory Visit (INDEPENDENT_AMBULATORY_CARE_PROVIDER_SITE_OTHER): Payer: Medicare Other | Admitting: Vascular Surgery

## 2017-06-03 ENCOUNTER — Emergency Department: Payer: Medicare Other

## 2017-06-03 ENCOUNTER — Ambulatory Visit: Payer: Medicare Other | Admitting: Family Medicine

## 2017-06-03 ENCOUNTER — Inpatient Hospital Stay
Admission: EM | Admit: 2017-06-03 | Discharge: 2017-06-06 | DRG: 293 | Disposition: A | Discharge status: Skilled Nursing Facility | Payer: Medicare Other | Attending: Internal Medicine | Admitting: Internal Medicine

## 2017-06-03 DIAGNOSIS — E785 Hyperlipidemia, unspecified: Secondary | ICD-10-CM | POA: Diagnosis present

## 2017-06-03 DIAGNOSIS — Z66 Do not resuscitate: Secondary | ICD-10-CM | POA: Diagnosis not present

## 2017-06-03 DIAGNOSIS — M6281 Muscle weakness (generalized): Secondary | ICD-10-CM | POA: Diagnosis not present

## 2017-06-03 DIAGNOSIS — I5031 Acute diastolic (congestive) heart failure: Secondary | ICD-10-CM

## 2017-06-03 DIAGNOSIS — J811 Chronic pulmonary edema: Secondary | ICD-10-CM

## 2017-06-03 DIAGNOSIS — Z8249 Family history of ischemic heart disease and other diseases of the circulatory system: Secondary | ICD-10-CM

## 2017-06-03 DIAGNOSIS — I5023 Acute on chronic systolic (congestive) heart failure: Secondary | ICD-10-CM | POA: Diagnosis not present

## 2017-06-03 DIAGNOSIS — E119 Type 2 diabetes mellitus without complications: Secondary | ICD-10-CM | POA: Diagnosis present

## 2017-06-03 DIAGNOSIS — R262 Difficulty in walking, not elsewhere classified: Secondary | ICD-10-CM | POA: Diagnosis not present

## 2017-06-03 DIAGNOSIS — M7989 Other specified soft tissue disorders: Secondary | ICD-10-CM | POA: Diagnosis not present

## 2017-06-03 DIAGNOSIS — Z7401 Bed confinement status: Secondary | ICD-10-CM | POA: Diagnosis not present

## 2017-06-03 DIAGNOSIS — S22019D Unspecified fracture of first thoracic vertebra, subsequent encounter for fracture with routine healing: Secondary | ICD-10-CM | POA: Diagnosis not present

## 2017-06-03 DIAGNOSIS — S066X0D Traumatic subarachnoid hemorrhage without loss of consciousness, subsequent encounter: Secondary | ICD-10-CM | POA: Diagnosis not present

## 2017-06-03 DIAGNOSIS — Z9889 Other specified postprocedural states: Secondary | ICD-10-CM

## 2017-06-03 DIAGNOSIS — E039 Hypothyroidism, unspecified: Secondary | ICD-10-CM | POA: Diagnosis present

## 2017-06-03 DIAGNOSIS — I110 Hypertensive heart disease with heart failure: Principal | ICD-10-CM | POA: Diagnosis present

## 2017-06-03 DIAGNOSIS — R778 Other specified abnormalities of plasma proteins: Secondary | ICD-10-CM

## 2017-06-03 DIAGNOSIS — I5033 Acute on chronic diastolic (congestive) heart failure: Secondary | ICD-10-CM | POA: Diagnosis not present

## 2017-06-03 DIAGNOSIS — I10 Essential (primary) hypertension: Secondary | ICD-10-CM | POA: Diagnosis not present

## 2017-06-03 DIAGNOSIS — Z79899 Other long term (current) drug therapy: Secondary | ICD-10-CM

## 2017-06-03 DIAGNOSIS — Z7982 Long term (current) use of aspirin: Secondary | ICD-10-CM

## 2017-06-03 DIAGNOSIS — Z7984 Long term (current) use of oral hypoglycemic drugs: Secondary | ICD-10-CM

## 2017-06-03 DIAGNOSIS — J90 Pleural effusion, not elsewhere classified: Secondary | ICD-10-CM | POA: Diagnosis not present

## 2017-06-03 DIAGNOSIS — I5043 Acute on chronic combined systolic (congestive) and diastolic (congestive) heart failure: Secondary | ICD-10-CM | POA: Diagnosis present

## 2017-06-03 DIAGNOSIS — R7989 Other specified abnormal findings of blood chemistry: Secondary | ICD-10-CM | POA: Diagnosis not present

## 2017-06-03 DIAGNOSIS — R079 Chest pain, unspecified: Secondary | ICD-10-CM | POA: Diagnosis not present

## 2017-06-03 DIAGNOSIS — I5021 Acute systolic (congestive) heart failure: Secondary | ICD-10-CM | POA: Diagnosis not present

## 2017-06-03 DIAGNOSIS — R296 Repeated falls: Secondary | ICD-10-CM | POA: Diagnosis not present

## 2017-06-03 DIAGNOSIS — Z86718 Personal history of other venous thrombosis and embolism: Secondary | ICD-10-CM | POA: Diagnosis not present

## 2017-06-03 DIAGNOSIS — R609 Edema, unspecified: Secondary | ICD-10-CM

## 2017-06-03 DIAGNOSIS — R748 Abnormal levels of other serum enzymes: Secondary | ICD-10-CM | POA: Diagnosis not present

## 2017-06-03 LAB — TROPONIN I
TROPONIN I: 0.15 ng/mL — AB (ref <0.03)
Troponin I: 0.13 ng/mL (ref <0.03)
Troponin I: 0.16 ng/mL (ref <0.03)

## 2017-06-03 LAB — BASIC METABOLIC PANEL
Anion gap: 11 (ref 5–15)
BUN: 30 mg/dL — ABNORMAL HIGH (ref 6–20)
CHLORIDE: 103 mmol/L (ref 101–111)
CO2: 25 mmol/L (ref 22–32)
CREATININE: 1.14 mg/dL — AB (ref 0.44–1.00)
Calcium: 9.5 mg/dL (ref 8.9–10.3)
GFR calc non Af Amer: 40 mL/min — ABNORMAL LOW (ref >60)
GFR, EST AFRICAN AMERICAN: 47 mL/min — AB (ref >60)
GLUCOSE: 128 mg/dL — AB (ref 65–99)
Potassium: 4.5 mmol/L (ref 3.5–5.1)
Sodium: 139 mmol/L (ref 135–145)

## 2017-06-03 LAB — CBC
HCT: 35.9 % (ref 35.0–47.0)
HEMOGLOBIN: 12.1 g/dL (ref 12.0–16.0)
MCH: 31 pg (ref 26.0–34.0)
MCHC: 33.6 g/dL (ref 32.0–36.0)
MCV: 92.2 fL (ref 80.0–100.0)
PLATELETS: 208 10*3/uL (ref 150–440)
RBC: 3.9 MIL/uL (ref 3.80–5.20)
RDW: 18.9 % — ABNORMAL HIGH (ref 11.5–14.5)
WBC: 6.1 10*3/uL (ref 3.6–11.0)

## 2017-06-03 LAB — GLUCOSE, CAPILLARY
GLUCOSE-CAPILLARY: 135 mg/dL — AB (ref 65–99)
Glucose-Capillary: 92 mg/dL (ref 65–99)

## 2017-06-03 LAB — BRAIN NATRIURETIC PEPTIDE: B Natriuretic Peptide: >4500 pg/mL — ABNORMAL HIGH (ref 0.0–100.0)

## 2017-06-03 MED ORDER — IOPAMIDOL (ISOVUE-370) INJECTION 76%
75.0000 mL | Freq: Once | INTRAVENOUS | Status: AC | PRN
  Administered 2017-06-03: 75 mL via INTRAVENOUS

## 2017-06-03 MED ORDER — LEVOTHYROXINE SODIUM 50 MCG PO TABS
50.0000 ug | ORAL_TABLET | Freq: Every day | ORAL | Status: DC
  Administered 2017-06-06: 50 ug via ORAL
  Filled 2017-06-03 (×2): qty 1

## 2017-06-03 MED ORDER — PRAVASTATIN SODIUM 40 MG PO TABS
40.0000 mg | ORAL_TABLET | Freq: Every day | ORAL | Status: DC
  Administered 2017-06-04 – 2017-06-05 (×2): 40 mg via ORAL
  Filled 2017-06-03 (×2): qty 1

## 2017-06-03 MED ORDER — DOCUSATE SODIUM 100 MG PO CAPS
100.0000 mg | ORAL_CAPSULE | Freq: Two times a day (BID) | ORAL | Status: DC | PRN
  Administered 2017-06-05: 100 mg via ORAL
  Filled 2017-06-03: qty 1

## 2017-06-03 MED ORDER — METOPROLOL SUCCINATE ER 50 MG PO TB24
50.0000 mg | ORAL_TABLET | Freq: Every day | ORAL | Status: DC
  Administered 2017-06-03 – 2017-06-06 (×4): 50 mg via ORAL
  Filled 2017-06-03 (×4): qty 1

## 2017-06-03 MED ORDER — FUROSEMIDE 10 MG/ML IJ SOLN
20.0000 mg | Freq: Three times a day (TID) | INTRAMUSCULAR | Status: DC
  Administered 2017-06-03 – 2017-06-04 (×2): 20 mg via INTRAVENOUS
  Filled 2017-06-03 (×2): qty 2

## 2017-06-03 MED ORDER — LISINOPRIL 5 MG PO TABS
5.0000 mg | ORAL_TABLET | Freq: Every day | ORAL | Status: DC
  Administered 2017-06-03 – 2017-06-05 (×3): 5 mg via ORAL
  Filled 2017-06-03 (×3): qty 1

## 2017-06-03 MED ORDER — INSULIN ASPART 100 UNIT/ML ~~LOC~~ SOLN
0.0000 [IU] | Freq: Three times a day (TID) | SUBCUTANEOUS | Status: DC
  Administered 2017-06-04: 2 [IU] via SUBCUTANEOUS
  Administered 2017-06-05 (×2): 1 [IU] via SUBCUTANEOUS
  Administered 2017-06-06: 2 [IU] via SUBCUTANEOUS
  Filled 2017-06-03 (×4): qty 1

## 2017-06-03 MED ORDER — HEPARIN SODIUM (PORCINE) 5000 UNIT/ML IJ SOLN
5000.0000 [IU] | Freq: Three times a day (TID) | INTRAMUSCULAR | Status: DC
  Administered 2017-06-03 – 2017-06-06 (×9): 5000 [IU] via SUBCUTANEOUS
  Filled 2017-06-03 (×9): qty 1

## 2017-06-03 MED ORDER — AMLODIPINE BESYLATE 5 MG PO TABS
2.5000 mg | ORAL_TABLET | Freq: Every day | ORAL | Status: DC
  Administered 2017-06-03 – 2017-06-05 (×3): 2.5 mg via ORAL
  Filled 2017-06-03 (×3): qty 1

## 2017-06-03 MED ORDER — ACETAMINOPHEN 325 MG PO TABS
650.0000 mg | ORAL_TABLET | Freq: Four times a day (QID) | ORAL | Status: DC | PRN
  Administered 2017-06-04: 650 mg via ORAL

## 2017-06-03 MED ORDER — OXYCODONE-ACETAMINOPHEN 5-325 MG PO TABS: 1.0000 | ORAL_TABLET | Freq: Four times a day (QID) | ORAL | Status: DC | PRN

## 2017-06-03 MED ORDER — SODIUM CHLORIDE 0.9% FLUSH
3.0000 mL | Freq: Two times a day (BID) | INTRAVENOUS | Status: DC
  Administered 2017-06-03 – 2017-06-06 (×7): 3 mL via INTRAVENOUS

## 2017-06-03 MED ORDER — FUROSEMIDE 10 MG/ML IJ SOLN
80.0000 mg | Freq: Once | INTRAMUSCULAR | Status: AC
  Administered 2017-06-03: 80 mg via INTRAVENOUS
  Filled 2017-06-03: qty 8

## 2017-06-03 MED ORDER — ASPIRIN 81 MG PO CHEW
81.0000 mg | CHEWABLE_TABLET | Freq: Every day | ORAL | Status: DC
  Administered 2017-06-03 – 2017-06-06 (×4): 81 mg via ORAL
  Filled 2017-06-03 (×4): qty 1

## 2017-06-03 NOTE — H&P (Signed)
Lorraine Fischer NAME: Lorraine Fischer    MR#:  [id number]  DATE OF BIRTH:  [birth year]  DATE OF ADMISSION:  06/03/2017  PRIMARY CARE PHYSICIAN: Juline Patch, MD   REQUESTING/REFERRING PHYSICIAN: Gwyndolyn Saxon  CHIEF COMPLAINT:   Chief Complaint  Patient presents with  . Leg Swelling    HISTORY OF PRESENT ILLNESS: Lorraine Fischer  is a 83 y.o. female with a known history of DM, Hyperlipidemia, Hypertension, hypothyroidism- have worsening edema on her legs and some small blisters and leaking fluids now. Also have SOB, so called PMD, they sent to ER for CHF. Noted generalized pulm edema and some pleural effusions also.  Started on IV lasix and given for admission.  PAST MEDICAL HISTORY:   Past Medical History:  Diagnosis Date  . Diabetes mellitus without complication ([place])   . Hyperlipidemia   . Hypertension   . Thyroid disease     PAST SURGICAL HISTORY: Past Surgical History:  Procedure Laterality Date  . ANKLE SURGERY    . HIP FRACTURE SURGERY    . WRIST SURGERY Right     SOCIAL HISTORY:  Social History  Substance Use Topics  . Smoking status: Never Smoker  . Smokeless tobacco: Never Used  . Alcohol use No    FAMILY HISTORY:  Family History  Problem Relation Age of Onset  . Hypertension Mother     DRUG ALLERGIES:  Allergies  Allergen Reactions  . Sulfa Antibiotics Shortness Of Breath  . Sulfasalazine Shortness Of Breath    REVIEW OF SYSTEMS:   CONSTITUTIONAL: No fever,positive for fatigue or weakness.  EYES: No blurred or double vision.  EARS, NOSE, AND THROAT: No tinnitus or ear pain.  RESPIRATORY: No cough, shortness of breath, wheezing or hemoptysis.  CARDIOVASCULAR: No chest pain, positive for orthopnea, edema.  GASTROINTESTINAL: No nausea, vomiting, diarrhea or abdominal pain.  GENITOURINARY: No dysuria, hematuria.  ENDOCRINE: No polyuria, nocturia,  HEMATOLOGY: No anemia, easy bruising or bleeding SKIN: No rash  or lesion. MUSCULOSKELETAL: No joint pain or arthritis.   NEUROLOGIC: No tingling, numbness, weakness.  PSYCHIATRY: No anxiety or depression.   MEDICATIONS AT HOME:  Prior to Admission medications   Medication Sig Start Date End Date Taking? Authorizing Provider  amLODipine (NORVASC) 2.5 MG tablet Take 1 tablet (2.5 mg total) by mouth daily. 02/27/17  Yes Juline Patch, MD  aspirin 81 MG chewable tablet Chew 81 mg by mouth daily.   Yes [provider]  furosemide (LASIX) 20 MG tablet Take 1 tablet (20 mg total) by mouth daily. Patient taking differently: Take 40 mg by mouth daily.  03/06/17  Yes Juline Patch, MD  levothyroxine (SYNTHROID, LEVOTHROID) 50 MCG tablet Take 1 tablet (50 mcg total) by mouth daily before breakfast. 02/27/17  Yes Juline Patch, MD  lisinopril (PRINIVIL,ZESTRIL) 5 MG tablet Take 1 tablet (5 mg total) by mouth daily. 02/27/17 02/27/18 Yes Juline Patch, MD  lovastatin (MEVACOR) 40 MG tablet Take 1 tablet (40 mg total) by mouth daily. 02/27/17  Yes Juline Patch, MD  metFORMIN (GLUCOPHAGE) 500 MG tablet TAKE ONE TABLET BY MOUTH ONCE DAILY WITH BREAKFAT 02/27/17  Yes Juline Patch, MD  metoprolol succinate (TOPROL-XL) 50 MG 24 hr tablet Take 1 tablet (50 mg total) by mouth daily. Take with or immediately following a meal. 02/27/17  Yes Juline Patch, MD      PHYSICAL EXAMINATION:   VITAL SIGNS: Blood pressure 127/78, pulse 64, resp.  rate (!) 21, height 4\' 11"  (1.499 m), weight 49 kg (108 lb), SpO2 92 %.  GENERAL:  83 y.o.-year-old patient lying in the bed with no acute distress.  EYES: Pupils equal, round, reactive to light and accommodation. No scleral icterus. Extraocular muscles intact.  HEENT: Head atraumatic, normocephalic. Oropharynx and nasopharynx clear.  NECK:  Supple, no jugular venous distention. No thyroid enlargement, no tenderness.  LUNGS: Normal breath sounds bilaterally, no wheezing, bilateral crepitation. No use of accessory muscles  of respiration.  CARDIOVASCULAR: S1, S2 normal. No murmurs, rubs, or gallops.  ABDOMEN: Soft, nontender, nondistended. Bowel sounds present. No organomegaly or mass.  EXTREMITIES: b/l pedal edema, no cyanosis, or clubbing.  NEUROLOGIC: Cranial nerves II through XII are intact. Muscle strength 5/5 in all extremities. Sensation intact. Gait not checked.  PSYCHIATRIC: The patient is alert and oriented x 3.  SKIN: No obvious rash, lesion, or ulcer.   LABORATORY PANEL:   CBC  Recent Labs Lab 06/03/17 0928  WBC 6.1  HGB 12.1  HCT 35.9  PLT 208  MCV 92.2  MCH 31.0  MCHC 33.6  RDW 18.9*   ------------------------------------------------------------------------------------------------------------------  Chemistries   Recent Labs Lab 06/03/17 0928  NA 139  K 4.5  CL 103  CO2 25  GLUCOSE 128*  BUN 30*  CREATININE 1.14*  CALCIUM 9.5   ------------------------------------------------------------------------------------------------------------------ estimated creatinine clearance is 21.5 mL/min (A) (by C-G formula based on SCr of 1.14 mg/dL (H)). ------------------------------------------------------------------------------------------------------------------ No results for input(s): TSH, T4TOTAL, T3FREE, THYROIDAB in the last 72 hours.  Invalid input(s): FREET3   Coagulation profile No results for input(s): INR, PROTIME in the last 168 hours. ------------------------------------------------------------------------------------------------------------------- No results for input(s): DDIMER in the last 72 hours. -------------------------------------------------------------------------------------------------------------------  Cardiac Enzymes  Recent Labs Lab 06/03/17 0928  TROPONINI 0.13*   ------------------------------------------------------------------------------------------------------------------ Invalid input(s):  POCBNP  ---------------------------------------------------------------------------------------------------------------  Urinalysis    Component Value Date/Time   COLORURINE YELLOW (A) 07/17/2016 2036   APPEARANCEUR CLEAR (A) 07/17/2016 2036   APPEARANCEUR Hazy 12/01/2013 0853   LABSPEC 1.008 07/17/2016 2036   LABSPEC 1.019 12/01/2013 0853   PHURINE 7.0 07/17/2016 2036   GLUCOSEU NEGATIVE 07/17/2016 2036   GLUCOSEU Negative 12/01/2013 0853   HGBUR 1+ (A) 07/17/2016 2036   BILIRUBINUR negative 10/18/2016 1449   BILIRUBINUR Negative 12/01/2013 [place] (A) 07/17/2016 2036   PROTEINUR 1+ 10/18/2016 1449   PROTEINUR NEGATIVE 07/17/2016 2036   UROBILINOGEN 0.2 10/18/2016 1449   NITRITE positive 10/18/2016 1449   NITRITE NEGATIVE 07/17/2016 2036   LEUKOCYTESUR Negative 10/18/2016 1449   LEUKOCYTESUR 1+ 12/01/2013 0853     RADIOLOGY: Dg Chest 2 View  Result Date: 06/03/2017 CLINICAL DATA:  Chest pain, leg swelling EXAM: CHEST  2 VIEW COMPARISON:  04/04/2017 FINDINGS: Cardiomegaly with vascular congestion and interstitial prominence concerning for interstitial edema. Small bilateral pleural effusions with bibasilar opacities, left greater than right, likely atelectasis. Diffuse aortic calcifications without visible aneurysm. No acute bony abnormality. IMPRESSION: Cardiomegaly.  Suspect mild interstitial edema. Small bilateral effusions with bibasilar atelectasis. Electronically Signed   By: Rolm Baptise M.D.   On: 06/03/2017 10:44   Ct Angio Chest Pe W And/or Wo Contrast  Result Date: 06/03/2017 CLINICAL DATA:  Bilateral leg swelling. EXAM: CT ANGIOGRAPHY CHEST WITH CONTRAST TECHNIQUE: Multidetector CT imaging of the chest was performed using the standard protocol during bolus administration of intravenous contrast. Multiplanar CT image reconstructions and MIPs were obtained to evaluate the vascular anatomy. CONTRAST:  60 cc Isovue 370 IV COMPARISON:  03/19/2017 FINDINGS:  Cardiovascular: No filling defects in the pulmonary arteries to suggest pulmonary emboli. Cardiomegaly. Diffuse aortic and coronary artery calcifications. No evidence of aortic aneurysm. Mediastinum/Nodes: Diffuse edema throughout the mediastinum. No visible measurable enlarged lymph nodes. Lungs/Pleura: Large bilateral pleural effusions. Bibasilar atelectasis. Mild ground-glass opacities throughout the lungs could reflect early edema. Upper Abdomen: Imaging into the upper abdomen shows no acute findings. Musculoskeletal: Mild compression fracture at T1, stable. Mild compression fracture at T3 is new since prior study. Review of the MIP images confirms the above findings. IMPRESSION: No evidence of pulmonary embolus. Large bilateral pleural effusions. Cardiomegaly. Ground-glass opacities throughout the lungs could reflect early edema. Bibasilar atelectasis. Chronic stable T1 compression fracture. Mild T3 compression fracture, new since prior study. Coronary artery disease, aortic atherosclerosis. Electronically Signed   By: Rolm Baptise M.D.   On: 06/03/2017 11:42   US Venous Img Lower Bilateral  Result Date: 06/03/2017 CLINICAL DATA:  History of bilateral calf vein thrombosis EXAM: BILATERAL LOWER EXTREMITY VENOUS DOPPLER ULTRASOUND TECHNIQUE: Gray-scale sonography with graded compression, as well as color Doppler and duplex ultrasound were performed to evaluate the lower extremity deep venous systems from the level of the common femoral vein and including the common femoral, femoral, profunda femoral, popliteal and calf veins including the posterior tibial, peroneal and gastrocnemius veins when visible. The superficial great saphenous vein was also interrogated. Spectral Doppler was utilized to evaluate flow at rest and with distal augmentation maneuvers in the common femoral, femoral and popliteal veins. COMPARISON:  03/21/2017 FINDINGS: RIGHT LOWER EXTREMITY Common Femoral Vein: No evidence of thrombus.  Normal compressibility, respiratory phasicity and response to augmentation. Saphenofemoral Junction: No evidence of thrombus. Normal compressibility and flow on color Doppler imaging. Profunda Femoral Vein: No evidence of thrombus. Normal compressibility and flow on color Doppler imaging. Femoral Vein: No evidence of thrombus. Normal compressibility, respiratory phasicity and response to augmentation. Popliteal Vein: No evidence of thrombus. Normal compressibility, respiratory phasicity and response to augmentation. Calf Veins: No evidence of thrombus. Normal compressibility and flow on color Doppler imaging. Superficial Great Saphenous Vein: No evidence of thrombus. Normal compressibility. Venous Reflux:  None. Other Findings:  None. LEFT LOWER EXTREMITY Common Femoral Vein: No evidence of thrombus. Normal compressibility, respiratory phasicity and response to augmentation. Saphenofemoral Junction: No evidence of thrombus. Normal compressibility and flow on color Doppler imaging. Profunda Femoral Vein: No evidence of thrombus. Normal compressibility and flow on color Doppler imaging. Femoral Vein: No evidence of thrombus. Normal compressibility, respiratory phasicity and response to augmentation. Popliteal Vein: No evidence of thrombus. Normal compressibility, respiratory phasicity and response to augmentation. Calf Veins: No evidence of thrombus. Normal compressibility and flow on color Doppler imaging. Superficial Great Saphenous Vein: No evidence of thrombus. Normal compressibility. Venous Reflux:  None. Other Findings:  None. IMPRESSION: Previously seen peroneal vein thrombus is not well visualized on today's exam and has likely resolved. No new deep venous thrombosis is identified. Electronically Signed   By: Inez Catalina M.D.   On: 06/03/2017 13:13    EKG: Orders placed or performed during the hospital encounter of 06/03/17  . EKG 12-Lead  . EKG 12-Lead  . ED EKG within 10 minutes  . ED EKG within 10  minutes    IMPRESSION AND PLAN:  * Ac diastolic CHF   IV lasix, Fluid restrictions, I/O monitor.    Get ECHO and monitor on tele with serial troponin.   If needed, may call Dr. Ubaldo Glassing- cardio consult.  * Hypertension   Cont home meds  *  DM   Hold metformin due to CHF   Keep on ISS.  * Hx of DVT   No DVT now, checked US Venous.    Cont ASA.  * Hyperlipidemia   COnt statin.  All the records are reviewed and case discussed with ED provider. Management plans discussed with the patient, family and they are in agreement.  CODE STATUS: DNR Code Status History    This patient does not have a recorded code status. Please follow your organizational policy for patients in this situation.    Advance Directive Documentation     Most Recent Value  Type of Advance Directive  Healthcare Power of Attorney, Living will  Pre-existing out of facility DNR order (yellow form or pink MOST form)  -  "MOST" Form in Place?  -     Spoke to her son in room.  TOTAL TIME TAKING CARE OF THIS PATIENT: 45 minutes.    Vaughan Basta M.D on 06/03/2017   Between 7am to 6pm - Pager - [phone]  After 6pm go to [email or web address] - password [email or web address] [facility]  Office  [phone]  CC: Primary care physician; Juline Patch, MD   Note: This dictation was prepared with Dragon dictation along with smaller phrase technology. Any transcriptional errors that result from this process are unintentional.

## 2017-06-03 NOTE — ED Notes (Signed)
EDP at bedside  

## 2017-06-03 NOTE — ED Triage Notes (Addendum)
Pt arrives to ER via POV from home c/o bilateral leg swelling X 2 days. Pt states left worse than right. Painful. Blisters to outer left lower leg present at this time. Denies injury. Takes fluid pill as prescribed. Pt alert and oriented X4, active, cooperative, pt in NAD. RR even and unlabored, color WNL.    Pt reports CP yesterday.

## 2017-06-03 NOTE — ED Notes (Signed)
First Nurse Note:  Dr. Ronnald Ramp' office called to say they were sending patient to the ED.  Patient has pitting edema to the left leg that is so severe it is "oozing fluid".  Family reports swelling began on Saturday night.  PCP did not treat but sent to ED for eval.

## 2017-06-03 NOTE — ED Notes (Signed)
Pt returned from xray, resting in bed, family at bedside

## 2017-06-03 NOTE — ED Notes (Signed)
Dr. Corky Downs notified of critical trop of 0.13

## 2017-06-03 NOTE — ED Notes (Signed)
Pt returned from US, resting in bed, family at bedside 

## 2017-06-03 NOTE — ED Notes (Signed)
Pt resting in bed, awaiting admission, family at bedside

## 2017-06-03 NOTE — ED Notes (Signed)
Patient transported to X-ray 

## 2017-06-03 NOTE — ED Provider Notes (Signed)
[facility] Emergency Department Provider Note       Time seen: ----------------------------------------- 10:59 AM on 06/03/2017 -----------------------------------------     I have reviewed the triage vital signs and the nursing notes.   HISTORY   Chief Complaint Leg Swelling    HPI Lorraine Fischer is a 83 y.o. female with a history of diabetes, hyperlipidemia, DVT and frequent falls who presents to the ED for bilateral edema for the past 2 days. Patient states this is acute on chronic. Patient states left leg is worse than the right and is painful. She did have some drainage from the left leg particularly. She was sent by her primary care doctor here for further evaluation for possible need for IV Lasix. She has been taking Lasix as prescribed. She does report some chest pain yesterday. She denies fevers, chills or other complaints.  Past Medical History:  Diagnosis Date  . Diabetes mellitus without complication ([place])   . Hyperlipidemia   . Hypertension   . Thyroid disease     Patient Active Problem List   Diagnosis Date Noted  . Hyperlipidemia 03/22/2017  . Carotid stenosis 03/22/2017  . Swelling of limb 03/22/2017  . DVT (deep venous thrombosis) ([place]) 03/22/2017  . Adult hypothyroidism 02/27/2017  . Multiple fractures 09/20/2016  . Frequent falls 09/20/2016  . Recurrent cystitis 09/20/2016  . Osteoporosis 11/08/2015  . Essential hypertension 11/08/2015    Past Surgical History:  Procedure Laterality Date  . ANKLE SURGERY    . HIP FRACTURE SURGERY    . WRIST SURGERY Right     Allergies Sulfa antibiotics and Sulfasalazine  Social History Social History  Substance Use Topics  . Smoking status: Never Smoker  . Smokeless tobacco: Never Used  . Alcohol use No    Review of Systems Constitutional: Negative for fever. Cardiovascular: Negative for chest pain. Respiratory: positive for shortness of breath Gastrointestinal: Negative  for abdominal pain, vomiting and diarrhea. Genitourinary: Negative for dysuria. Musculoskeletal: positive for peripheral edema Skin: positive for wounds on the lower legs Neurological: Negative for headaches, focal weakness or numbness.  All systems negative/normal/unremarkable except as stated in the HPI  ____________________________________________   PHYSICAL EXAM:  VITAL SIGNS: ED Triage Vitals  Enc Vitals Group     BP 06/03/17 1030 129/62     Pulse Rate 06/03/17 1030 70     Resp 06/03/17 1030 (!) 24     Temp --      Temp src --      SpO2 06/03/17 1030 100 %     Weight 06/03/17 0922 108 lb (49 kg)     Height 06/03/17 0922 4\' 11"  (1.499 m)     Head Circumference --      Peak Flow --      Pain Score 06/03/17 0922 7     Pain Loc --      Pain Edu? --      Excl. in [place]? --     Constitutional: Alert and oriented. mild distress Eyes: Conjunctivae are normal. Normal extraocular movements. ENT   Head: Normocephalic and atraumatic.   Nose: No congestion/rhinnorhea.   Mouth/Throat: Mucous membranes are moist.   Neck: No stridor. Cardiovascular: Normal rate, regular rhythm. No murmurs, rubs, or gallops. Respiratory: Normal respiratory effort without tachypnea nor retractions. mild basilar rales Gastrointestinal: Soft and nontender. Normal bowel sounds Musculoskeletal: Nontender with normal range of motion in extremities. bilateral, diffuse lower extremity edema with mild erythema Neurologic:  Normal speech and language. No gross  focal neurologic deficits are appreciated.  Skin:  Skin is warm, dry and intact. No rash noted. Psychiatric: Mood and affect are normal. Speech and behavior are normal.  ____________________________________________  EKG: Interpreted by me.sinus rhythm rate of 78 bpm, normal PR interval, normal QRS, normal QT. ST depressions laterally that are chronic  ____________________________________________  ED COURSE:  Pertinent labs & imaging  results that were available during my care of the patient were reviewed by me and considered in my medical decision making (see chart for details). Patient presents for edema, we will assess with labs and imaging as indicated.   Procedures ____________________________________________   LABS (pertinent positives/negatives)  Labs Reviewed  BASIC METABOLIC PANEL - Abnormal; Notable for the following:       Result Value   Glucose, Bld 128 (*)    BUN 30 (*)    Creatinine, Ser 1.14 (*)    GFR calc non Af Amer 40 (*)    GFR calc Af Amer 47 (*)    All other components within normal limits  CBC - Abnormal; Notable for the following:    RDW 18.9 (*)    All other components within normal limits  TROPONIN I - Abnormal; Notable for the following:    Troponin I 0.13 (*)    All other components within normal limits  BRAIN NATRIURETIC PEPTIDE - Abnormal; Notable for the following:    B Natriuretic Peptide >4,500.0 (*)    All other components within normal limits   CRITICAL CARE Performed by: Earleen Newport   Total critical care time: 30 minutes  Critical care time was exclusive of separately billable procedures and treating other patients.  Critical care was necessary to treat or prevent imminent or life-threatening deterioration.  Critical care was time spent personally by me on the following activities: development of treatment plan with patient and/or surrogate as well as nursing, discussions with consultants, evaluation of patient's response to treatment, examination of patient, obtaining history from patient or surrogate, ordering and performing treatments and interventions, ordering and review of laboratory studies, ordering and review of radiographic studies, pulse oximetry and re-evaluation of patient's condition.  RADIOLOGY Images were viewed by me bilateral venous ultrasound IMPRESSION: Previously seen peroneal vein thrombus is not well visualized on today's exam and has  likely resolved.  No new deep venous thrombosis is identified. IMPRESSION: Cardiomegaly. Suspect mild interstitial edema.  Small bilateral effusions with bibasilar atelectasis. IMPRESSION: No evidence of pulmonary embolus.  Large bilateral pleural effusions.  Cardiomegaly. Ground-glass opacities throughout the lungs could reflect early edema.  Bibasilar atelectasis.  Chronic stable T1 compression fracture. Mild T3 compression fracture, new since prior study.  Coronary artery disease, aortic atherosclerosis.  ____________________________________________  DIFFERENTIAL DIAGNOSIS   DVT, cellulitis, CHF, MI, pleural effusions, pneumonia, PE   FINAL ASSESSMENT AND PLAN  CHF exacerbation, elevated troponin, pleural effusions   Plan: Patient had presented for worsening peripheral edema. Patients labs indicates that this is an exacerbation of congestive heart failure. Patients imaging confirms the lab findings of CHF exacerbation. Her primary care doctor sent her here for IV diuresis which she would likely benefit from. CT imaging of the chest revealed large bilateral pleural effusions. Troponin is also elevated which will need to be rechecked. I will discuss with the hospitalist for admission.   Earleen Newport, MD   Note: This note was generated in part or whole with voice recognition software. Voice recognition is usually quite accurate but there are transcription errors that can and very often  do occur. I apologize for any typographical errors that were not detected and corrected.     Earleen Newport, MD 06/03/17 [phone]

## 2017-06-04 ENCOUNTER — Inpatient Hospital Stay: Payer: Medicare Other

## 2017-06-04 ENCOUNTER — Inpatient Hospital Stay
Admit: 2017-06-04 | Discharge: 2017-06-04 | Disposition: A | Discharge status: Home / Self Care | Payer: Medicare Other | Attending: Internal Medicine | Admitting: Internal Medicine

## 2017-06-04 LAB — GLUCOSE, CAPILLARY
GLUCOSE-CAPILLARY: 96 mg/dL (ref 65–99)
GLUCOSE-CAPILLARY: 95 mg/dL (ref 65–99)
GLUCOSE-CAPILLARY: 152 mg/dL — AB (ref 65–99)
Glucose-Capillary: 162 mg/dL — ABNORMAL HIGH (ref 65–99)

## 2017-06-04 LAB — CBC
HCT: 36.3 % (ref 35.0–47.0)
HEMOGLOBIN: 12.3 g/dL (ref 12.0–16.0)
MCH: 31.2 pg (ref 26.0–34.0)
MCHC: 34 g/dL (ref 32.0–36.0)
MCV: 91.9 fL (ref 80.0–100.0)
PLATELETS: 200 10*3/uL (ref 150–440)
RBC: 3.95 MIL/uL (ref 3.80–5.20)
RDW: 18.7 % — ABNORMAL HIGH (ref 11.5–14.5)
WBC: 5.5 10*3/uL (ref 3.6–11.0)

## 2017-06-04 LAB — TROPONIN I
Troponin I: 0.13 ng/mL (ref <0.03)
Troponin I: 0.16 ng/mL (ref <0.03)
Troponin I: 0.17 ng/mL (ref <0.03)
Troponin I: 0.21 ng/mL (ref <0.03)

## 2017-06-04 LAB — BASIC METABOLIC PANEL
ANION GAP: 9 (ref 5–15)
BUN: 28 mg/dL — ABNORMAL HIGH (ref 6–20)
CALCIUM: 9.2 mg/dL (ref 8.9–10.3)
CHLORIDE: 106 mmol/L (ref 101–111)
CO2: 25 mmol/L (ref 22–32)
CREATININE: 1.01 mg/dL — AB (ref 0.44–1.00)
GFR calc non Af Amer: 47 mL/min — ABNORMAL LOW (ref >60)
GFR, EST AFRICAN AMERICAN: 54 mL/min — AB (ref >60)
Glucose, Bld: 118 mg/dL — ABNORMAL HIGH (ref 65–99)
Potassium: 3.8 mmol/L (ref 3.5–5.1)
SODIUM: 140 mmol/L (ref 135–145)

## 2017-06-04 MED ORDER — ACETAMINOPHEN 325 MG PO TABS
ORAL_TABLET | ORAL | Status: AC
  Administered 2017-06-04: 650 mg via ORAL
  Filled 2017-06-04: qty 2

## 2017-06-04 MED ORDER — DIPHENHYDRAMINE HCL 12.5 MG/5ML PO ELIX
12.5000 mg | ORAL_SOLUTION | Freq: Once | ORAL | Status: AC
  Administered 2017-06-04: 12.5 mg via ORAL
  Filled 2017-06-04 (×2): qty 5

## 2017-06-04 MED ORDER — FUROSEMIDE 10 MG/ML IJ SOLN
20.0000 mg | Freq: Two times a day (BID) | INTRAMUSCULAR | Status: DC
  Administered 2017-06-04 – 2017-06-06 (×4): 20 mg via INTRAVENOUS
  Filled 2017-06-04 (×4): qty 2

## 2017-06-04 NOTE — Discharge Instructions (Signed)
[facility] Failure Clinic appointment on June 10 2017 at 2:20pm with Darylene Price, [place]. Please call [phone] to reschedule.

## 2017-06-04 NOTE — Clinical Social Work Note (Signed)
CSW received referral for patient needing SNF.  CSW spoke to patient and her son Jonni Sanger [phone] and she would like to go to [facility] if possible again.  Patient has been to rehab in the past, CSW reminded patient and son how insurance will pay for stay.  Patient and son are in agreement to have CSW begin bed search in [place].  Formal assessment to follow.  Jones Broom. [place], MSW, [place]  06/04/2017 5:21 PM

## 2017-06-04 NOTE — Progress Notes (Signed)
Physical Therapy Evaluation Patient Details Name: Lorraine Fischer MRN: [id number] DOB: 1922-10-10 Today's Date: 06/04/2017   History of Present Illness  Pt admitted for acute diastolic CHF, complaints of worsening edema in legs, LLE worse than RLE. Angiogram and [place] on 10/15 negative for PE and DVT. EKG planned for 10/15. Monitor on telemetry with serial troponin.   Clinical Impression  Pt is a pleasant 83 year old female admitted for acute diastolic CHF. Communicated with RN about troponin levels, per RN elevated troponin due to demand ischemia. Pt performed there-ex, bed mobility with CGA, transfers and amb with RW with min assist. Pt amb a total of 40 ft, required continuous cuing to keep RW close to body, required min assist to navigate RW around furniture within room, appeared to rely on RW for support, recommend use of RW for mobility. Pt stated feeling very weak throughout session, appeared fatigued but motivated to participate in PT session. Pt's son was present throughout session and provided history and pt's PLOF. Pt demonstrates deficits with UE/LE strength, endurance, use of DME, transfers and amb. Would benefit from further skilled PT services to promote optimal return to home. Recommend transition to SNF upon DC from acute hospitalization.     Follow Up Recommendations SNF    Equipment Recommendations  None recommended by PT    Recommendations for Other Services       Precautions / Restrictions Precautions Precautions: Fall Restrictions Weight Bearing Restrictions: No      Mobility  Bed Mobility Overal bed mobility: Needs Assistance Bed Mobility: Supine to Sit     Supine to sit: Min guard     General bed mobility comments: Pt utilizes bed rails, cues for proper technique, able to scoot to EOB, min assist to guide LEs off bed due to weakness  Transfers Overall transfer level: Needs assistance Equipment used: Rolling walker (2 wheeled) Transfers: Sit to/from  Stand Sit to Stand: Min assist         General transfer comment: Pt requires cues for proper hand placement, able to rise to standing with min assist.   Ambulation/Gait Ambulation/Gait assistance: Min assist Ambulation Distance (Feet): 40 Feet Assistive device: Rolling walker (2 wheeled) Gait Pattern/deviations: Step-through pattern     General Gait Details: Pt amb with short steps, requires continuous cuing to keep RW close to body, pt tends to keep RW very forward. Pt requires min assist to maintain control of RW and navigate around furniture to amb safely. No LOB however appears slightly unsteady on feet due to LE weakness  Stairs            Wheelchair Mobility    Modified Rankin (Stroke Patients Only)       Balance Overall balance assessment: Needs assistance Sitting-balance support: Feet supported Sitting balance-Leahy Scale: Good Sitting balance - Comments: No cues or assist.    Standing balance support: Bilateral upper extremity supported Standing balance-Leahy Scale: Good Standing balance comment: Pt relies on RW for UE support and balance                             Pertinent Vitals/Pain Pain Assessment: 0-10 Pain Score: 3  Pain Intervention(s): Limited activity within patient's tolerance;Monitored during session;Repositioned    Home Living Family/patient expects to be discharged to:: Private residence Living Arrangements: Children (son) Available Help at Discharge: Family;Personal care attendant Type of Home: House Home Access: Level entry     Home Layout: Two level;Able to  live on main level with bedroom/bathroom Home Equipment: Kasandra Knudsen - single point;Walker - 2 wheels;Wheelchair - manual Additional Comments: Pt reports amb primarily with SPC, occasionally RW, manual WC for appointments and community amb.    Prior Function Level of Independence: Needs assistance         Comments: Pt reports she has a personal caregiver help for 3  hours in the morning most days of the week, son is available in afternoons/evening after work. Pt states she requires assistance for ADLS.      Hand Dominance        Extremity/Trunk Assessment   Upper Extremity Assessment Upper Extremity Assessment: Generalized weakness (UE MMT grossly 3+/5)    Lower Extremity Assessment Lower Extremity Assessment: Generalized weakness (LE MMT grossly 3+/5)    Cervical / Trunk Assessment Cervical / Trunk Assessment: Kyphotic  Communication   Communication: No difficulties  Cognition Arousal/Alertness: Awake/alert Behavior During Therapy: WFL for tasks assessed/performed Overall Cognitive Status: Within Functional Limits for tasks assessed                                        General Comments      Exercises Other Exercises Other Exercises: Supine ther-ex 10x, B ankle pumps, hip abd/add, SLRs. Cues for proper technique. CGA to help initiate exercises   Assessment/Plan    PT Assessment Patient needs continued PT services  PT Problem List Decreased strength;Decreased range of motion;Decreased activity tolerance;Decreased balance;Decreased mobility;Decreased knowledge of use of DME;Pain       PT Treatment Interventions DME instruction;Gait training;Functional mobility training;Therapeutic activities;Therapeutic exercise;Balance training;Patient/family education    PT Goals (Current goals can be found in the Care Plan section)  Acute Rehab PT Goals Patient Stated Goal: to get stronger PT Goal Formulation: With patient Time For Goal Achievement: 06/18/17 Potential to Achieve Goals: Good    Frequency Min 2X/week   Barriers to discharge        Co-evaluation               AM-PAC PT "6 Clicks" Daily Activity  Outcome Measure Difficulty turning over in bed (including adjusting bedclothes, sheets and blankets)?: None Difficulty moving from lying on back to sitting on the side of the bed? : Unable Difficulty  sitting down on and standing up from a chair with arms (e.g., wheelchair, bedside commode, etc,.)?: Unable Help needed moving to and from a bed to chair (including a wheelchair)?: A Little Help needed walking in hospital room?: A Little Help needed climbing 3-5 steps with a railing? : A Lot 6 Click Score: 14    End of Session Equipment Utilized During Treatment: Gait belt Activity Tolerance: Patient limited by fatigue Patient left: in chair;with call bell/phone within reach;with chair alarm set;with family/visitor present Nurse Communication: Mobility status PT Visit Diagnosis: Other abnormalities of gait and mobility (R26.89);Muscle weakness (generalized) (M62.81);Unsteadiness on feet (R26.81);Pain    Time: 4580-9983 PT Time Calculation (min) (ACUTE ONLY): 43 min   Charges:         PT G Codes:   PT G-Codes **NOT FOR INPATIENT CLASS** Functional Assessment Tool Used: AM-PAC 6 Clicks Basic Mobility Functional Limitation: Mobility: Walking and moving around Mobility: Walking and Moving Around Current Status (J8250): At least 40 percent but less than 60 percent impaired, limited or restricted Mobility: Walking and Moving Around Goal Status [phone]): At least 20 percent but less than 40 percent impaired, limited or  restricted    Manfred Arch, SPT  Manfred Arch 06/04/2017, 12:35 PM

## 2017-06-04 NOTE — Progress Notes (Signed)
[employer] [facility] [place] NAME: Lorraine Fischer    MR#:  [id number]  DATE OF BIRTH:  September 18, 1922  SUBJECTIVE:   Patient still with sob this am   REVIEW OF SYSTEMS:    Review of Systems  Constitutional: Negative for fever, chills weight loss HENT: Negative for ear pain, nosebleeds, congestion, facial swelling, rhinorrhea, neck pain, neck stiffness and ear discharge.   Respiratory: Negative for cough, +++shortness of breath, No wheezing  Cardiovascular: Negative for chest pain, palpitations and leg swelling (IMPROVED).  Gastrointestinal: Negative for heartburn, abdominal pain, vomiting, diarrhea or consitpation Genitourinary: Negative for dysuria, urgency, frequency, hematuria Musculoskeletal: Negative for back pain or joint pain Neurological: Negative for dizziness, seizures, syncope, focal weakness,  numbness and headaches.  Hematological: Does not bruise/bleed easily.  Psychiatric/Behavioral: Negative for hallucinations, confusion, dysphoric mood  SKIN has small wound on left lateral shin  Tolerating Diet: yes      DRUG ALLERGIES:   Allergies  Allergen Reactions  . Sulfa Antibiotics Shortness Of Breath  . Sulfasalazine Shortness Of Breath    VITALS:  Blood pressure (!) 138/59, pulse 66, temperature 97.7 F (36.5 C), temperature source Oral, resp. rate 18, height 4\' 10"  (1.473 m), weight 50.8 kg (111 lb 14.4 oz), SpO2 98 %.  PHYSICAL EXAMINATION:  Constitutional: Appears well-developed and well-nourished. No distress. HENT: Normocephalic. Marland Kitchen Oropharynx is clear and moist.  Eyes: Conjunctivae and EOM are normal. PERRLA, no scleral icterus.  Neck: Normal ROM. Neck supple. ++ JVD. No tracheal deviation. CVS: RRR, S1/S2 +, 2/6 SEM RSB, no gallops, no carotid bruit.  Pulmonary: crackles at the bases noted  Abdominal: Soft. BS +,  no distension, tenderness, rebound or guarding.  Musculoskeletal: Normal range of motion. No edema and no  tenderness.  Neuro: Alert. CN 2-12 grossly intact. No focal deficits. Skin: Skin is warm and dry.left leg shin has very small abrasion/skin tear Psychiatric: Normal mood and affect.      LABORATORY PANEL:   CBC  Recent Labs Lab 06/04/17 0019  WBC 5.5  HGB 12.3  HCT 36.3  PLT 200   ------------------------------------------------------------------------------------------------------------------  Chemistries   Recent Labs Lab 06/04/17 0019  NA 140  K 3.8  CL 106  CO2 25  GLUCOSE 118*  BUN 28*  CREATININE 1.01*  CALCIUM 9.2   ------------------------------------------------------------------------------------------------------------------  Cardiac Enzymes  Recent Labs Lab 06/03/17 2020 06/04/17 0019 06/04/17 1032  TROPONINI 0.16* 0.21* 0.16*   ------------------------------------------------------------------------------------------------------------------  RADIOLOGY:  Dg Chest 2 View  Result Date: 06/04/2017 CLINICAL DATA:  Pulmonary edema. EXAM: CHEST  2 VIEW COMPARISON:  CT chest and chest x-ray from yesterday. FINDINGS: Stable cardiomegaly. Pulmonary vascular congestion and interstitial edema are also similar to prior study. Layering moderate left and small right pleural effusions are unchanged. Bibasilar atelectasis. No pneumothorax. IMPRESSION: 1. Stable cardiomegaly and interstitial edema. 2. Moderate left and small right pleural effusions, unchanged. Electronically Signed   By: Titus Dubin M.D.   On: 06/04/2017 07:22   Dg Chest 2 View  Result Date: 06/03/2017 CLINICAL DATA:  Chest pain, leg swelling EXAM: CHEST  2 VIEW COMPARISON:  04/04/2017 FINDINGS: Cardiomegaly with vascular congestion and interstitial prominence concerning for interstitial edema. Small bilateral pleural effusions with bibasilar opacities, left greater than right, likely atelectasis. Diffuse aortic calcifications without visible aneurysm. No acute bony abnormality. IMPRESSION:  Cardiomegaly.  Suspect mild interstitial edema. Small bilateral effusions with bibasilar atelectasis. Electronically Signed   By: Rolm Baptise M.D.   On: 06/03/2017 10:44  Ct Angio Chest Pe W And/or Wo Contrast  Result Date: 06/03/2017 CLINICAL DATA:  Bilateral leg swelling. EXAM: CT ANGIOGRAPHY CHEST WITH CONTRAST TECHNIQUE: Multidetector CT imaging of the chest was performed using the standard protocol during bolus administration of intravenous contrast. Multiplanar CT image reconstructions and MIPs were obtained to evaluate the vascular anatomy. CONTRAST:  60 cc Isovue 370 IV COMPARISON:  03/19/2017 FINDINGS: Cardiovascular: No filling defects in the pulmonary arteries to suggest pulmonary emboli. Cardiomegaly. Diffuse aortic and coronary artery calcifications. No evidence of aortic aneurysm. Mediastinum/Nodes: Diffuse edema throughout the mediastinum. No visible measurable enlarged lymph nodes. Lungs/Pleura: Large bilateral pleural effusions. Bibasilar atelectasis. Mild ground-glass opacities throughout the lungs could reflect early edema. Upper Abdomen: Imaging into the upper abdomen shows no acute findings. Musculoskeletal: Mild compression fracture at T1, stable. Mild compression fracture at T3 is new since prior study. Review of the MIP images confirms the above findings. IMPRESSION: No evidence of pulmonary embolus. Large bilateral pleural effusions. Cardiomegaly. Ground-glass opacities throughout the lungs could reflect early edema. Bibasilar atelectasis. Chronic stable T1 compression fracture. Mild T3 compression fracture, new since prior study. Coronary artery disease, aortic atherosclerosis. Electronically Signed   By: Rolm Baptise M.D.   On: 06/03/2017 11:42   US Venous Img Lower Bilateral  Result Date: 06/03/2017 CLINICAL DATA:  History of bilateral calf vein thrombosis EXAM: BILATERAL LOWER EXTREMITY VENOUS DOPPLER ULTRASOUND TECHNIQUE: Gray-scale sonography with graded compression, as  well as color Doppler and duplex ultrasound were performed to evaluate the lower extremity deep venous systems from the level of the common femoral vein and including the common femoral, femoral, profunda femoral, popliteal and calf veins including the posterior tibial, peroneal and gastrocnemius veins when visible. The superficial great saphenous vein was also interrogated. Spectral Doppler was utilized to evaluate flow at rest and with distal augmentation maneuvers in the common femoral, femoral and popliteal veins. COMPARISON:  03/21/2017 FINDINGS: RIGHT LOWER EXTREMITY Common Femoral Vein: No evidence of thrombus. Normal compressibility, respiratory phasicity and response to augmentation. Saphenofemoral Junction: No evidence of thrombus. Normal compressibility and flow on color Doppler imaging. Profunda Femoral Vein: No evidence of thrombus. Normal compressibility and flow on color Doppler imaging. Femoral Vein: No evidence of thrombus. Normal compressibility, respiratory phasicity and response to augmentation. Popliteal Vein: No evidence of thrombus. Normal compressibility, respiratory phasicity and response to augmentation. Calf Veins: No evidence of thrombus. Normal compressibility and flow on color Doppler imaging. Superficial Great Saphenous Vein: No evidence of thrombus. Normal compressibility. Venous Reflux:  None. Other Findings:  None. LEFT LOWER EXTREMITY Common Femoral Vein: No evidence of thrombus. Normal compressibility, respiratory phasicity and response to augmentation. Saphenofemoral Junction: No evidence of thrombus. Normal compressibility and flow on color Doppler imaging. Profunda Femoral Vein: No evidence of thrombus. Normal compressibility and flow on color Doppler imaging. Femoral Vein: No evidence of thrombus. Normal compressibility, respiratory phasicity and response to augmentation. Popliteal Vein: No evidence of thrombus. Normal compressibility, respiratory phasicity and response to  augmentation. Calf Veins: No evidence of thrombus. Normal compressibility and flow on color Doppler imaging. Superficial Great Saphenous Vein: No evidence of thrombus. Normal compressibility. Venous Reflux:  None. Other Findings:  None. IMPRESSION: Previously seen peroneal vein thrombus is not well visualized on today's exam and has likely resolved. No new deep venous thrombosis is identified. Electronically Signed   By: Inez Catalina M.D.   On: 06/03/2017 13:13     ASSESSMENT AND PLAN:   83 year old female with chronic diastolic heart failure preserved ejection fraction by  echocardiogram March 2018 who presented with lower extremity edema.  1. Acute on chronic diastolic heart failure with preserved ejection fraction: Continue IV Lasix Continue to monitor intake and output with daily weight Follow-up oncology consultation She will need CHF clinic upon discharge.   2. Hypothyroidism: Continue Synthroid  3. Essential hypertension: Continue lisinopril and metoprolol  4. Hyperlipidemia: Continue statin  5. Diabetes: Continue sliding scale   Physical therapy consultation placed for disposition planning Management plans discussed with the patient and she is in agreement.  CODE STATUS: DO NOT RESUSCITATE  TOTAL TIME TAKING CARE OF THIS PATIENT:30 minutes.   Called son [phone]  POSSIBLE D/C tomorrow, DEPENDING ON CLINICAL CONDITION.   Kylian Loh M.D on 06/04/2017 at 12:10 PM  Between 7am to 6pm - Pager - [phone] After 6pm go to [email or web address] - password [email or web address] [facility]  Office  [phone]  CC: Primary care physician; Juline Patch, MD  Note: This dictation was prepared with Dragon dictation along with smaller phrase technology. Any transcriptional errors that result from this process are unintentional.

## 2017-06-04 NOTE — NC FL2 (Signed)
[place] LEVEL OF CARE SCREENING TOOL     IDENTIFICATION  Patient Name: Lorraine Fischer Birthdate: 1923/03/25 Sex: female Admission Date (Current Location): 06/03/2017  [place] and [place] Number:  Engineering geologist and Address:  [facility], [address] [place], [place] [address]      Provider Number: [id number]  Attending Physician Name and Address:  Bettey Costa, MD  Relative Name and Phone Number:  Erendida, Wrenn [phone] and Amilyah, Nack [phone]     Current Level of Care: Hospital Recommended Level of Care: [place] Prior Approval Number:    Date Approved/Denied:   PASRR Number: [id number]  Discharge Plan: SNF    Current Diagnoses: Patient Active Problem List   Diagnosis Date Noted  . Acute diastolic CHF (congestive heart failure) ([place]) 06/03/2017  . Hyperlipidemia 03/22/2017  . Carotid stenosis 03/22/2017  . Swelling of limb 03/22/2017  . DVT (deep venous thrombosis) ([place]) 03/22/2017  . Adult hypothyroidism 02/27/2017  . Multiple fractures 09/20/2016  . Frequent falls 09/20/2016  . Recurrent cystitis 09/20/2016  . Osteoporosis 11/08/2015  . Essential hypertension 11/08/2015    Orientation RESPIRATION BLADDER Height & Weight     Self, Situation, Place  Normal Incontinent Weight: 111 lb 14.4 oz (50.8 kg) Height:  4\' 10"  (147.3 cm)  BEHAVIORAL SYMPTOMS/MOOD NEUROLOGICAL BOWEL NUTRITION STATUS      Continent Diet (Cardiac diet)  AMBULATORY STATUS COMMUNICATION OF NEEDS Skin   Limited Assist Verbally Normal                       Personal Care Assistance Level of Assistance  Bathing, Feeding, Dressing Bathing Assistance: Limited assistance Feeding assistance: Independent Dressing Assistance: Limited assistance     Functional Limitations Info  Sight, Hearing, Speech Sight Info: Adequate Hearing Info: Impaired Speech Info: Adequate    SPECIAL CARE FACTORS FREQUENCY  PT  (By licensed PT)     PT Frequency: 5x a week              Contractures Contractures Info: Not present    Additional Factors Info  Code Status, Allergies, Insulin Sliding Scale Code Status Info: DNR Allergies Info: SULFA ANTIBIOTICS, SULFASALAZINE    Insulin Sliding Scale Info: insulin aspart (novoLOG) injection 0-9 Units 3x a day with meals       Current Medications (06/04/2017):  This is the current hospital active medication list Current Facility-Administered Medications  Medication Dose Route Frequency Provider Last Rate Last Dose  . acetaminophen (TYLENOL) tablet 650 mg  650 mg Oral Q6H PRN Demetrios Loll, MD   650 mg at 06/04/17 0007  . amLODipine (NORVASC) tablet 2.5 mg  2.5 mg Oral Daily Vaughan Basta, MD   2.5 mg at 06/04/17 0933  . aspirin chewable tablet 81 mg  81 mg Oral Daily Vaughan Basta, MD   81 mg at 06/04/17 0933  . docusate sodium (COLACE) capsule 100 mg  100 mg Oral BID PRN Vaughan Basta, MD      . furosemide (LASIX) injection 20 mg  20 mg Intravenous Q12H Mody, Sital, MD      . heparin injection 5,000 Units  5,000 Units Subcutaneous Q8H Vaughan Basta, MD   5,000 Units at 06/04/17 1340  . insulin aspart (novoLOG) injection 0-9 Units  0-9 Units Subcutaneous TID WC Vaughan Basta, MD   2 Units at 06/04/17 1232  . levothyroxine (SYNTHROID, LEVOTHROID) tablet 50 mcg  50 mcg Oral QAC breakfast Vaughan Basta, MD      .  lisinopril (PRINIVIL,ZESTRIL) tablet 5 mg  5 mg Oral Daily Vaughan Basta, MD   5 mg at 06/04/17 0933  . metoprolol succinate (TOPROL-XL) 24 hr tablet 50 mg  50 mg Oral Daily Vaughan Basta, MD   50 mg at 06/04/17 0933  . oxyCODONE-acetaminophen (PERCOCET/ROXICET) 5-325 MG per tablet 1 tablet  1 tablet Oral Q6H PRN Demetrios Loll, MD      . pravastatin (PRAVACHOL) tablet 40 mg  40 mg Oral q1800 Vaughan Basta, MD      . sodium chloride flush (NS) 0.9 % injection 3 mL  3 mL Intravenous  Q12H Vaughan Basta, MD   3 mL at 06/04/17 0935     Discharge Medications: Please see discharge summary for a list of discharge medications.  Relevant Imaging Results:  Relevant Lab Results:   Additional Information SSN [id number]  Ross Ludwig, [place]

## 2017-06-04 NOTE — Progress Notes (Signed)
Pt was being attended by medical team on two attempts to visit. [place] prayed silently at the door and will follow up later in rounding.    06/04/17 1000  Clinical Encounter Type  Visited With Patient;Patient not available  Visit Type Initial;Spiritual support  Referral From Nurse  Consult/Referral To Chaplain  Spiritual Encounters  Spiritual Needs Prayer

## 2017-06-04 NOTE — Progress Notes (Signed)
Pt family requesting something to help pt sleep, pt is confused and has her days and nights mixed up. Family would like something to help her sleep tonight so maybe she can have her schedule back on track. Spoke with Dr. Anselm Jungling, gave orders for a one time dose of 12.5 mg benadryl.

## 2017-06-05 ENCOUNTER — Encounter: Payer: Self-pay | Admitting: *Deleted

## 2017-06-05 LAB — GLUCOSE, CAPILLARY
GLUCOSE-CAPILLARY: 106 mg/dL — AB (ref 65–99)
Glucose-Capillary: 122 mg/dL — ABNORMAL HIGH (ref 65–99)
Glucose-Capillary: 126 mg/dL — ABNORMAL HIGH (ref 65–99)
Glucose-Capillary: 150 mg/dL — ABNORMAL HIGH (ref 65–99)

## 2017-06-05 LAB — BASIC METABOLIC PANEL
ANION GAP: 11 (ref 5–15)
BUN: 29 mg/dL — ABNORMAL HIGH (ref 6–20)
CALCIUM: 9.4 mg/dL (ref 8.9–10.3)
CO2: 22 mmol/L (ref 22–32)
CREATININE: 0.96 mg/dL (ref 0.44–1.00)
Chloride: 104 mmol/L (ref 101–111)
GFR, EST AFRICAN AMERICAN: 58 mL/min — AB (ref >60)
GFR, EST NON AFRICAN AMERICAN: 50 mL/min — AB (ref >60)
GLUCOSE: 118 mg/dL — AB (ref 65–99)
POTASSIUM: 3.7 mmol/L (ref 3.5–5.1)
Sodium: 137 mmol/L (ref 135–145)

## 2017-06-05 LAB — ECHOCARDIOGRAM COMPLETE
Height: 58 in
WEIGHTICAEL: 1790.4 [oz_av]

## 2017-06-05 MED ORDER — DIPHENHYDRAMINE HCL 12.5 MG/5ML PO ELIX
12.5000 mg | ORAL_SOLUTION | Freq: Every evening | ORAL | Status: DC | PRN
Start: 2017-06-05 — End: 2017-06-06
  Administered 2017-06-05: 12.5 mg via ORAL
  Filled 2017-06-05 (×2): qty 5

## 2017-06-05 MED ORDER — LISINOPRIL 10 MG PO TABS
10.0000 mg | ORAL_TABLET | Freq: Every day | ORAL | Status: DC
  Administered 2017-06-06: 10 mg via ORAL
  Filled 2017-06-05: qty 1

## 2017-06-05 MED ORDER — LISINOPRIL 5 MG PO TABS
5.0000 mg | ORAL_TABLET | Freq: Once | ORAL | Status: AC
  Administered 2017-06-05: 5 mg via ORAL
  Filled 2017-06-05: qty 1

## 2017-06-05 MED ORDER — SPIRONOLACTONE 25 MG PO TABS
12.5000 mg | ORAL_TABLET | Freq: Every day | ORAL | Status: DC
  Administered 2017-06-05 – 2017-06-06 (×2): 12.5 mg via ORAL
  Filled 2017-06-05 (×2): qty 1

## 2017-06-05 NOTE — Progress Notes (Signed)
Physical Therapy Treatment Patient Details Name: Lorraine Fischer MRN: [id number] DOB: [birth year] Today's Date: 06/05/2017    History of Present Illness Pt admitted for acute diastolic CHF, complaints of worsening edema in legs, LLE worse than RLE. Angiogram and [place] on 10/15 negative for PE and DVT. EKG planned for 10/15. Monitor on telemetry with serial troponin.     PT Comments    Pt is making slow progress with goals. Pt performed UE/LE therex and STS x4, with cues for proper technique, pt required frequent rest breaks throughout session due to SOB. Pt received in recliner, performed transfers with RW with min assist, required cues for proper hand placement. Pt demonstrates decreased strength and endurance with transfers and standing balance. Pt reported feeling tired and weak throughout session, however was motivated to participate in PT activities. Continue to progress with there-ex and functional mobility. RN was present during session to take vitals.     Follow Up Recommendations  SNF     Equipment Recommendations  None recommended by PT    Recommendations for Other Services       Precautions / Restrictions Precautions Precautions: Fall Restrictions Weight Bearing Restrictions: No    Mobility  Bed Mobility               General bed mobility comments: Pt received in recliner  Transfers Overall transfer level: Needs assistance Equipment used: Rolling walker (2 wheeled) Transfers: Sit to/from Stand Sit to Stand: Min assist         General transfer comment: Requires cues for proper hand placement and to stand tall, requires additional time to rise to standing and place both hands on the recliner  Ambulation/Gait             General Gait Details: Deferred   Stairs            Wheelchair Mobility    Modified Rankin (Stroke Patients Only)       Balance Overall balance assessment: Needs assistance Sitting-balance support: Feet  supported Sitting balance-Leahy Scale: Good Sitting balance - Comments: Pt able to sit upright with no cues or assist, two pillows placed behind back to support upright position   Standing balance support: Bilateral upper extremity supported Standing balance-Leahy Scale: Good Standing balance comment: Pt relies on RW for UE support and balance, able to maintain standing position for several seconds, quickly fatigues                            Cognition Arousal/Alertness: Awake/alert Behavior During Therapy: WFL for tasks assessed/performed Overall Cognitive Status: Within Functional Limits for tasks assessed                                        Exercises Other Exercises Other Exercises: Supine ther-ex 12x, B ankle pumps, hip abd/add, LAQs, SLRs, bicep curls, shoulder raises, air punches. STS x4 with RW. Cues for proper technique. Pt requires rest breaks after each exercise due to SOB and easily fatigued.    General Comments        Pertinent Vitals/Pain Pain Assessment: Faces Pain Score: 2  Faces Pain Scale: Hurts a little bit Pain Location: chest Pain Intervention(s): Limited activity within patient's tolerance;Monitored during session;Repositioned    Home Living  Prior Function            PT Goals (current goals can now be found in the care plan section) Acute Rehab PT Goals Patient Stated Goal: to get stronger PT Goal Formulation: With patient Time For Goal Achievement: 06/18/17 Potential to Achieve Goals: Good Progress towards PT goals: Progressing toward goals    Frequency    Min 2X/week      PT Plan Current plan remains appropriate    Co-evaluation              AM-PAC PT "6 Clicks" Daily Activity  Outcome Measure  Difficulty turning over in bed (including adjusting bedclothes, sheets and blankets)?: None Difficulty moving from lying on back to sitting on the side of the bed? :  Unable Difficulty sitting down on and standing up from a chair with arms (e.g., wheelchair, bedside commode, etc,.)?: Unable Help needed moving to and from a bed to chair (including a wheelchair)?: A Little Help needed walking in hospital room?: A Little Help needed climbing 3-5 steps with a railing? : A Lot 6 Click Score: 14    End of Session Equipment Utilized During Treatment: Gait belt Activity Tolerance: Patient limited by fatigue Patient left: in chair;with call bell/phone within reach;with chair alarm set;with family/visitor present   PT Visit Diagnosis: Other abnormalities of gait and mobility (R26.89);Muscle weakness (generalized) (M62.81);Pain Pain - part of body:  (chest)     Time: 1426-1450 PT Time Calculation (min) (ACUTE ONLY): 24 min  Charges:                       G Codes:  Functional Assessment Tool Used: AM-PAC 6 Clicks Basic Mobility Functional Limitation: Mobility: Walking and moving around Mobility: Walking and Moving Around Current Status (Z7673): At least 40 percent but less than 60 percent impaired, limited or restricted Mobility: Walking and Moving Around Goal Status [phone]): At least 20 percent but less than 40 percent impaired, limited or restricted    Manfred Arch, SPT   Manfred Arch 06/05/2017, 3:44 PM

## 2017-06-05 NOTE — Care Management Important Message (Signed)
Important Message  Patient Details  Name: Tenita Cue MRN: [id number] Date of Birth: 1923-04-11   Medicare Important Message Given:  Yes    Shelbie Ammons, RN 06/05/2017, 8:32 AM

## 2017-06-05 NOTE — Clinical Social Work Note (Signed)
Clinical Social Work Assessment  Patient Details  Name: Lorraine Fischer MRN: [id number] Date of Birth: February 19, 1923  Date of referral:  06/04/17               Reason for consult:  Facility Placement                Permission sought to share information with:  Facility Sport and exercise psychologist, Family Supports Permission granted to share information::  Yes, Verbal Permission Granted  Name::     Loriel, Diehl [phone] or Maytal, Mijangos [phone]   Agency::  SNF admissions  Relationship::     Contact Information:     Housing/Transportation Living arrangements for the past 2 months:  Single Family Home Source of Information:  Patient, Adult Children Patient Interpreter Needed:  None Criminal Activity/Legal Involvement Pertinent to Current Situation/Hospitalization:  No - Comment as needed Significant Relationships:  Adult Children Lives with:  Adult Children Do you feel safe going back to the place where you live?  No Need for family participation in patient care:  Yes (Comment)  Care giving concerns:  Patient and family feel she needs some short term rehab before she returns back home.   Social Worker assessment / plan:  Patient is a 83 year old female who is alert and oriented x2.  Patient lives with her son, who is the primary caregiver for her.  Patient states she has been to rehab before, and was at [facility].  Patient and family feel like they would like her to return back to SNF for short term rehab.  Patient and son were explained how insurance will pay for stay.  Patient and family did not express any other questions and gave CSW permission to begin bed search in [place].  Employment status:  Retired Nurse, adult PT Recommendations:  [place] / Referral to community resources:  [place]  Patient/Family's Response to care:  Patient and family in agreement to going to SNF for short term  rehab.  Patient/Family's Understanding of and Emotional Response to Diagnosis, Current Treatment, and Prognosis:  Patient and family are aware of current treatment plan and prognosis.  Patient's family are hopeful she will not have to be in SNF for very long.  Emotional Assessment Appearance:  Appears stated age Attitude/Demeanor/Rapport:    Affect (typically observed):  Appropriate, Calm, Stable Orientation:  Oriented to Self, Oriented to Situation Alcohol / Substance use:  Not Applicable Psych involvement (Current and /or in the community):  No (Comment)  Discharge Needs  Concerns to be addressed:  Lack of Support, Care Coordination Readmission within the last 30 days:  No Current discharge risk:  Lack of support system Barriers to Discharge:  Continued Medical Work up   Anell Barr 06/05/2017, 4:22 PM

## 2017-06-05 NOTE — Progress Notes (Signed)
[facility] at [place] NAME: Lorraine Fischer    MR#:  [id number]  DATE OF BIRTH:  1923/07/12  SUBJECTIVE:   Patient still with some SOB and edema.  appears anxious.   REVIEW OF SYSTEMS:    Review of Systems  Constitutional: Negative for fever, chills weight loss HENT: Negative for ear pain, nosebleeds, congestion, facial swelling, rhinorrhea, neck pain, neck stiffness and ear discharge.   Respiratory: Negative for cough, +shortness of breath, No wheezing  Cardiovascular: Negative for chest pain, palpitations and leg swelling (IMPROVED).  Gastrointestinal: Negative for heartburn, abdominal pain, vomiting, diarrhea or consitpation Genitourinary: Negative for dysuria, urgency, frequency, hematuria Musculoskeletal: Negative for back pain or joint pain Neurological: Negative for dizziness, seizures, syncope, focal weakness,  numbness and headaches.  Hematological: Does not bruise/bleed easily.  Psychiatric/Behavioral: Negative for hallucinations, confusion, dysphoric mood  SKIN has small wound on left lateral shin  Tolerating Diet: yes      DRUG ALLERGIES:   Allergies  Allergen Reactions  . Sulfa Antibiotics Shortness Of Breath  . Sulfasalazine Shortness Of Breath    VITALS:  Blood pressure (!) 123/52, pulse 66, temperature 97.8 F (36.6 C), temperature source Oral, resp. rate 18, height 4\' 10"  (1.473 m), weight 50.5 kg (111 lb 4.8 oz), SpO2 95 %.  PHYSICAL EXAMINATION:  Constitutional: Appears well-developed and well-nourished. No distress. HENT: Normocephalic. Marland Kitchen Oropharynx is clear and moist.  Eyes: Conjunctivae and EOM are normal. PERRLA, no scleral icterus.  Neck: Normal ROM. Neck supple. + JVD. No tracheal deviation. CVS: RRR, S1/S2 +, 2/6 SEM RSB, no gallops, no carotid bruit.  Pulmonary: crackles at the bases noted  Abdominal: Soft. BS +,  no distension, tenderness, rebound or guarding.  Musculoskeletal: Normal range of motion.  Positive for edema and no tenderness.  Neuro: Alert. CN 2-12 grossly intact. No focal deficits. Skin: Skin is warm and dry.left leg shin has very small abrasion/skin tear Psychiatric: Normal mood and affect.      LABORATORY PANEL:   CBC  Recent Labs Lab 06/04/17 0019  WBC 5.5  HGB 12.3  HCT 36.3  PLT 200   ------------------------------------------------------------------------------------------------------------------  Chemistries   Recent Labs Lab 06/05/17 0445  NA 137  K 3.7  CL 104  CO2 22  GLUCOSE 118*  BUN 29*  CREATININE 0.96  CALCIUM 9.4   ------------------------------------------------------------------------------------------------------------------  Cardiac Enzymes  Recent Labs Lab 06/04/17 1032 06/04/17 1604 06/04/17 2214  TROPONINI 0.16* 0.17* 0.13*   ------------------------------------------------------------------------------------------------------------------  RADIOLOGY:  Dg Chest 2 View  Result Date: 06/04/2017 CLINICAL DATA:  Pulmonary edema. EXAM: CHEST  2 VIEW COMPARISON:  CT chest and chest x-ray from yesterday. FINDINGS: Stable cardiomegaly. Pulmonary vascular congestion and interstitial edema are also similar to prior study. Layering moderate left and small right pleural effusions are unchanged. Bibasilar atelectasis. No pneumothorax. IMPRESSION: 1. Stable cardiomegaly and interstitial edema. 2. Moderate left and small right pleural effusions, unchanged. Electronically Signed   By: Titus Dubin M.D.   On: 06/04/2017 07:22     ASSESSMENT AND PLAN:   83 year old female with chronic diastolic heart failure preserved ejection fraction by echocardiogram March 2018 who presented with lower extremity edema.  1. Acute on chronic systolic heart failure : Continue IV Lasix Continue to monitor intake and output with daily weight As significant drop in EF, called cardiology consultation She will need CHF clinic upon discharge. On  lisinopril, metoprolol- added spironolactone.  monitor electrolyte and renal func.  2. Hypothyroidism: Continue Synthroid  3.  Essential hypertension: Continue lisinopril and metoprolol  4. Hyperlipidemia: Continue statin  5. Diabetes: Continue sliding scale   Physical therapy consultation placed for disposition planning Management plans discussed with the patient and she is in agreement.  CODE STATUS: DO NOT RESUSCITATE  TOTAL TIME TAKING CARE OF THIS PATIENT:30 minutes.   POSSIBLE D/C tomorrow, DEPENDING ON CLINICAL CONDITION.   Vaughan Basta M.D on 06/05/2017 at 8:41 PM  Between 7am to 6pm - Pager - [phone] After 6pm go to [email or web address] - password [email or web address] [facility]  Office  [phone]  CC: Primary care physician; Juline Patch, MD  Note: This dictation was prepared with Dragon dictation along with smaller phrase technology. Any transcriptional errors that result from this process are unintentional.

## 2017-06-05 NOTE — Consult Note (Signed)
[name]  CARDIOLOGY CONSULT NOTE  Patient ID: Lorraine Fischer MRN: [id number] DOB/AGE: 1923/07/19 83 y.o.  Admit date: 06/03/2017 Referring Physician Dr. Anselm Jungling Primary Physician   Primary Cardiologist Dr. Ubaldo Glassing Reason for Consultation Congestive heart failure  HPI: Patient is a 83 year old female with history of peripheral edema and shortness of breath diabetes who is admitted with progressive shortness of breath. Echo showed reduction in her ejection fraction. Echocardiograms done in the office over the past several years has shown fairly well preserved LV function. Echo showed decline in her LV function with an EF of 25-30%. Etiology of this decline is unclear. Patient has complaints of peripheral edema and shortness of breath and chest pain. Etiology of the drop in EF factorial. Patient is not a candidate for invasive evaluation given her advanced age.  Review of Systems  Constitutional: Positive for malaise/fatigue.  HENT: Negative.   Eyes: Negative.   Respiratory: Positive for shortness of breath.   Cardiovascular: Positive for leg swelling.  Gastrointestinal: Negative.   Genitourinary: Negative.   Musculoskeletal: Negative.   Skin: Negative.   Neurological: Positive for weakness.  Endo/Heme/Allergies: Negative.   Psychiatric/Behavioral: The patient is nervous/anxious.     Past Medical History:  Diagnosis Date  . Diabetes mellitus without complication ([place])   . Hyperlipidemia   . Hypertension   . Thyroid disease     Family History  Problem Relation Age of Onset  . Hypertension Mother     Social History   Social History  . Marital status: Widowed    Spouse name: N/A  . Number of children: N/A  . Years of education: N/A   Occupational History  . Not on file.   Social History Main Topics  . Smoking status: Never Smoker  . Smokeless tobacco: Never Used  . Alcohol use No  . Drug use: No  . Sexual  activity: Not Currently   Other Topics Concern  . Not on file   Social History Narrative  . No narrative on file    Past Surgical History:  Procedure Laterality Date  . ANKLE SURGERY    . HIP FRACTURE SURGERY    . WRIST SURGERY Right      Prescriptions Prior to Admission  Medication Sig Dispense Refill Last Dose  . amLODipine (NORVASC) 2.5 MG tablet Take 1 tablet (2.5 mg total) by mouth daily. 90 tablet 2 06/02/2017 at 0700  . aspirin 81 MG chewable tablet Chew 81 mg by mouth daily.   06/02/2017 at 0700  . furosemide (LASIX) 20 MG tablet Take 1 tablet (20 mg total) by mouth daily. (Patient taking differently: Take 40 mg by mouth daily. ) 30 tablet 3 06/02/2017 at 0800  . levothyroxine (SYNTHROID, LEVOTHROID) 50 MCG tablet Take 1 tablet (50 mcg total) by mouth daily before breakfast. 90 tablet 2 06/02/2017 at 0800  . lisinopril (PRINIVIL,ZESTRIL) 5 MG tablet Take 1 tablet (5 mg total) by mouth daily. 90 tablet 2 06/02/2017 at 0800  . lovastatin (MEVACOR) 40 MG tablet Take 1 tablet (40 mg total) by mouth daily. 90 tablet 2 06/02/2017 at 0800  . metFORMIN (GLUCOPHAGE) 500 MG tablet TAKE ONE TABLET BY MOUTH ONCE DAILY WITH BREAKFAT 90 tablet 2 06/02/2017 at 0700  . metoprolol succinate (TOPROL-XL) 50 MG 24 hr tablet Take 1 tablet (50 mg total) by mouth daily. Take with or immediately following a meal. 90 tablet 2 06/02/2017 at 0800    Physical Exam: Blood pressure 121/60,  pulse 64, temperature (!) 97.5 F (36.4 C), temperature source Oral, resp. rate 18, height 4\' 10"  (1.473 m), weight 50.5 kg (111 lb 4.8 oz), SpO2 95 %.   Wt Readings from Last 1 Encounters:  06/05/17 50.5 kg (111 lb 4.8 oz)     General appearance: alert and cooperative Resp: clear to auscultation bilaterally Cardio: regular rate and rhythm GI: soft, non-tender; bowel sounds normal; no masses,  no organomegaly Extremities: edema 2-3+ Pulses: 2+ and symmetric Neurologic: Grossly normal  Labs:   Lab Results    Component Value Date   WBC 5.5 06/04/2017   HGB 12.3 06/04/2017   HCT 36.3 06/04/2017   MCV 91.9 06/04/2017   PLT 200 06/04/2017    Recent Labs Lab 06/05/17 0445  NA 137  K 3.7  CL 104  CO2 22  BUN 29*  CREATININE 0.96  CALCIUM 9.4  GLUCOSE 118*   Lab Results  Component Value Date   CKTOTAL 61 10/04/2013   CKMB 1.4 10/04/2013   TROPONINI 0.13 ([place]) 06/04/2017      Radiology: Chest x-ray showed cardiomegaly and interstitial edema with moderate left and right pleural effusions. EKG: Sinus rhythm with nonspecific ST-T wave changes  ASSESSMENT AND PLAN:  Patient with history of shortness of breath now with worsening ejection fraction after presenting with peripheral edema. Her ejection fraction is decreased to 25-35 range. It was near normal previously. Etiology of this is unclear. Ischemia versus nonischemic etiology beta played a role. Patient is not a candidate for invasive evaluation given advanced age. We'll need to optimize her diuretic and afterload reduction therapy. Will discontinue amlodipine given her peripheral edema. Would continue with careful diuresis following hemodynamics and electrolytes. We'll also add spironolactone and continue with afterload reduction with ACE inhibitor. Her elevated troponin appears to be demand ischemia given her cardiomyopathy. This does not appear to be a recent acute ischemic event. Signed: Teodoro Spray MD, [facility] 06/05/2017, 2:09 PM

## 2017-06-05 NOTE — Clinical Social Work Note (Signed)
CSW presented bed offers to patient and her son, they chose to go to [facility].  CSW contacted [facility] and they can accept patient once she is medically ready for discharge and orders have been received.  Jones Broom. Davontae Prusinski, MSW, [place]  06/05/2017 1:50 PM

## 2017-06-06 DIAGNOSIS — S22019D Unspecified fracture of first thoracic vertebra, subsequent encounter for fracture with routine healing: Secondary | ICD-10-CM | POA: Diagnosis not present

## 2017-06-06 DIAGNOSIS — M6281 Muscle weakness (generalized): Secondary | ICD-10-CM | POA: Diagnosis not present

## 2017-06-06 DIAGNOSIS — Z7984 Long term (current) use of oral hypoglycemic drugs: Secondary | ICD-10-CM | POA: Diagnosis not present

## 2017-06-06 DIAGNOSIS — Z79899 Other long term (current) drug therapy: Secondary | ICD-10-CM | POA: Diagnosis not present

## 2017-06-06 DIAGNOSIS — Z8249 Family history of ischemic heart disease and other diseases of the circulatory system: Secondary | ICD-10-CM | POA: Diagnosis not present

## 2017-06-06 DIAGNOSIS — Z7982 Long term (current) use of aspirin: Secondary | ICD-10-CM | POA: Diagnosis not present

## 2017-06-06 DIAGNOSIS — R002 Palpitations: Secondary | ICD-10-CM | POA: Diagnosis not present

## 2017-06-06 DIAGNOSIS — I5033 Acute on chronic diastolic (congestive) heart failure: Secondary | ICD-10-CM | POA: Diagnosis not present

## 2017-06-06 DIAGNOSIS — I5022 Chronic systolic (congestive) heart failure: Secondary | ICD-10-CM | POA: Diagnosis not present

## 2017-06-06 DIAGNOSIS — E785 Hyperlipidemia, unspecified: Secondary | ICD-10-CM | POA: Diagnosis not present

## 2017-06-06 DIAGNOSIS — E039 Hypothyroidism, unspecified: Secondary | ICD-10-CM | POA: Diagnosis not present

## 2017-06-06 DIAGNOSIS — I110 Hypertensive heart disease with heart failure: Secondary | ICD-10-CM | POA: Diagnosis not present

## 2017-06-06 DIAGNOSIS — L603 Nail dystrophy: Secondary | ICD-10-CM | POA: Diagnosis not present

## 2017-06-06 DIAGNOSIS — I10 Essential (primary) hypertension: Secondary | ICD-10-CM | POA: Diagnosis not present

## 2017-06-06 DIAGNOSIS — E119 Type 2 diabetes mellitus without complications: Secondary | ICD-10-CM | POA: Diagnosis not present

## 2017-06-06 DIAGNOSIS — I420 Dilated cardiomyopathy: Secondary | ICD-10-CM | POA: Diagnosis not present

## 2017-06-06 DIAGNOSIS — Z882 Allergy status to sulfonamides status: Secondary | ICD-10-CM | POA: Diagnosis not present

## 2017-06-06 DIAGNOSIS — S066X0D Traumatic subarachnoid hemorrhage without loss of consciousness, subsequent encounter: Secondary | ICD-10-CM | POA: Diagnosis not present

## 2017-06-06 DIAGNOSIS — I493 Ventricular premature depolarization: Secondary | ICD-10-CM | POA: Diagnosis not present

## 2017-06-06 DIAGNOSIS — R262 Difficulty in walking, not elsewhere classified: Secondary | ICD-10-CM | POA: Diagnosis not present

## 2017-06-06 DIAGNOSIS — R296 Repeated falls: Secondary | ICD-10-CM | POA: Diagnosis not present

## 2017-06-06 DIAGNOSIS — Z86718 Personal history of other venous thrombosis and embolism: Secondary | ICD-10-CM | POA: Diagnosis not present

## 2017-06-06 DIAGNOSIS — J811 Chronic pulmonary edema: Secondary | ICD-10-CM | POA: Diagnosis not present

## 2017-06-06 DIAGNOSIS — I5020 Unspecified systolic (congestive) heart failure: Secondary | ICD-10-CM | POA: Diagnosis not present

## 2017-06-06 DIAGNOSIS — R748 Abnormal levels of other serum enzymes: Secondary | ICD-10-CM | POA: Diagnosis not present

## 2017-06-06 DIAGNOSIS — Z7401 Bed confinement status: Secondary | ICD-10-CM | POA: Diagnosis not present

## 2017-06-06 DIAGNOSIS — R609 Edema, unspecified: Secondary | ICD-10-CM | POA: Diagnosis not present

## 2017-06-06 DIAGNOSIS — I5023 Acute on chronic systolic (congestive) heart failure: Secondary | ICD-10-CM | POA: Diagnosis not present

## 2017-06-06 LAB — BASIC METABOLIC PANEL
ANION GAP: 9 (ref 5–15)
BUN: 28 mg/dL — AB (ref 6–20)
CHLORIDE: 100 mmol/L — AB (ref 101–111)
CO2: 24 mmol/L (ref 22–32)
Calcium: 9 mg/dL (ref 8.9–10.3)
Creatinine, Ser: 0.95 mg/dL (ref 0.44–1.00)
GFR calc non Af Amer: 50 mL/min — ABNORMAL LOW (ref >60)
GFR, EST AFRICAN AMERICAN: 58 mL/min — AB (ref >60)
Glucose, Bld: 120 mg/dL — ABNORMAL HIGH (ref 65–99)
POTASSIUM: 3.6 mmol/L (ref 3.5–5.1)
SODIUM: 133 mmol/L — AB (ref 135–145)

## 2017-06-06 LAB — GLUCOSE, CAPILLARY
GLUCOSE-CAPILLARY: 117 mg/dL — AB (ref 65–99)
Glucose-Capillary: 161 mg/dL — ABNORMAL HIGH (ref 65–99)
Glucose-Capillary: 107 mg/dL — ABNORMAL HIGH (ref 65–99)

## 2017-06-06 MED ORDER — LISINOPRIL 10 MG PO TABS: 10.0000 mg | ORAL_TABLET | Freq: Every day | ORAL | 0 refills | Status: AC

## 2017-06-06 MED ORDER — DOCUSATE SODIUM 100 MG PO CAPS: 100.0000 mg | ORAL_CAPSULE | Freq: Two times a day (BID) | ORAL | 0 refills | Status: AC | PRN

## 2017-06-06 MED ORDER — SPIRONOLACTONE 25 MG PO TABS: 12.5000 mg | ORAL_TABLET | Freq: Every day | ORAL | 0 refills | Status: AC

## 2017-06-06 NOTE — Plan of Care (Signed)
Problem: Cardiac: Goal: Ability to achieve and maintain adequate cardiopulmonary perfusion will improve Outcome: Progressing IV Lasix, daily weights, intake and output  Problem: Safety: Goal: Ability to remain free from injury will improve Outcome: Progressing Fall precautions in place, non skid socks when oob

## 2017-06-06 NOTE — Clinical Social Work Note (Addendum)
Patient to be d/c'ed today to [facility] SNF room E9.  Patient and family agreeable to plans will transport via ems RN to call report [phone].  CSW notified patient's son Brynnan Rodenbaugh, [phone].   Evette Cristal, MSW, [place]

## 2017-06-06 NOTE — Progress Notes (Signed)
Report called to Edwin Cap RN at [place].

## 2017-06-06 NOTE — Discharge Summary (Signed)
[facility] NAME: Lorraine Fischer    MR#:  [id number]  DATE OF BIRTH:  14-Jan-1923  DATE OF ADMISSION:  06/03/2017 ADMITTING PHYSICIAN: Vaughan Basta, MD  DATE OF DISCHARGE: 06/06/2017  PRIMARY CARE PHYSICIAN: Juline Patch, MD    ADMISSION DIAGNOSIS:  Peripheral edema [R60.9] Pulmonary edema [J81.1] Acute on chronic systolic congestive heart failure (HCC) [I50.23] Elevated troponin I level [R74.8]  DISCHARGE DIAGNOSIS:  Principal Problem:   Acute diastolic CHF (congestive heart failure) ([place])   SECONDARY DIAGNOSIS:   Past Medical History:  Diagnosis Date  . Diabetes mellitus without complication ([place])   . Hyperlipidemia   . Hypertension   . Thyroid disease     HOSPITAL COURSE:   83 year old female with chronic diastolic heart failure preserved ejection fraction by echocardiogram March 2018 who presented with lower extremity edema.  1. Acute on chronic systolic heart failure : Continue IV Lasix Continue to monitor intake and output with daily weight As significant drop in EF, called cardiology consultation She will need CHF clinic upon discharge. On lisinopril, metoprolol- added spironolactone.  monitor electrolyte and renal func.  follow in heart failure and cardio clinic.  2. Hypothyroidism: Continue Synthroid  3. Essential hypertension: Continue lisinopril and metoprolol  4. Hyperlipidemia: Continue statin  5. Diabetes: Continue sliding scale  DISCHARGE CONDITIONS:   Stable.  CONSULTS OBTAINED:  Treatment Team:  Teodoro Spray, MD  DRUG ALLERGIES:   Allergies  Allergen Reactions  . Sulfa Antibiotics Shortness Of Breath  . Sulfasalazine Shortness Of Breath    DISCHARGE MEDICATIONS:   Current Discharge Medication List    START taking these medications   Details  docusate sodium (COLACE) 100 MG capsule Take 1 capsule (100 mg total) by mouth 2 (two) times daily as needed  for mild constipation. Qty: 10 capsule, Refills: 0    spironolactone (ALDACTONE) 25 MG tablet Take 0.5 tablets (12.5 mg total) by mouth daily. Qty: 30 tablet, Refills: 0      CONTINUE these medications which have CHANGED   Details  lisinopril (PRINIVIL,ZESTRIL) 10 MG tablet Take 1 tablet (10 mg total) by mouth daily. Qty: 30 tablet, Refills: 0      CONTINUE these medications which have NOT CHANGED   Details  aspirin 81 MG chewable tablet Chew 81 mg by mouth daily.    furosemide (LASIX) 20 MG tablet Take 1 tablet (20 mg total) by mouth daily. Qty: 30 tablet, Refills: 3   Associated Diagnoses: Shortness of breath; Localized edema    levothyroxine (SYNTHROID, LEVOTHROID) 50 MCG tablet Take 1 tablet (50 mcg total) by mouth daily before breakfast. Qty: 90 tablet, Refills: 2   Associated Diagnoses: Hypothyroidism, unspecified type    lovastatin (MEVACOR) 40 MG tablet Take 1 tablet (40 mg total) by mouth daily. Qty: 90 tablet, Refills: 2   Associated Diagnoses: Hyperlipidemia, unspecified hyperlipidemia type    metFORMIN (GLUCOPHAGE) 500 MG tablet TAKE ONE TABLET BY MOUTH ONCE DAILY WITH BREAKFAT Qty: 90 tablet, Refills: 2   Associated Diagnoses: Type 2 diabetes mellitus without complication, without long-term current use of insulin (HCC)    metoprolol succinate (TOPROL-XL) 50 MG 24 hr tablet Take 1 tablet (50 mg total) by mouth daily. Take with or immediately following a meal. Qty: 90 tablet, Refills: 2   Associated Diagnoses: Essential hypertension      STOP taking these medications     amLODipine (NORVASC) 2.5 MG tablet  DISCHARGE INSTRUCTIONS:    Follow in CHF and cardio clinic.  If you experience worsening of your admission symptoms, develop shortness of breath, life threatening emergency, suicidal or homicidal thoughts you must seek medical attention immediately by calling 911 or calling your MD immediately  if symptoms less severe.  You Must read complete  instructions/literature along with all the possible adverse reactions/side effects for all the Medicines you take and that have been prescribed to you. Take any new Medicines after you have completely understood and accept all the possible adverse reactions/side effects.   Please note  You were cared for by a hospitalist during your hospital stay. If you have any questions about your discharge medications or the care you received while you were in the hospital after you are discharged, you can call the unit and asked to speak with the hospitalist on call if the hospitalist that took care of you is not available. Once you are discharged, your primary care physician will handle any further medical issues. Please note that NO REFILLS for any discharge medications will be authorized once you are discharged, as it is imperative that you return to your primary care physician (or establish a relationship with a primary care physician if you do not have one) for your aftercare needs so that they can reassess your need for medications and monitor your lab values.    Today   CHIEF COMPLAINT:   Chief Complaint  Patient presents with  . Leg Swelling    HISTORY OF PRESENT ILLNESS:  Lorraine Fischer  is a 83 y.o. female with a known history of DM, Hyperlipidemia, Hypertension, hypothyroidism- have worsening edema on her legs and some small blisters and leaking fluids now. Also have SOB, so called PMD, they sent to ER for CHF. Noted generalized pulm edema and some pleural effusions also.  Started on IV lasix and given for admission.   VITAL SIGNS:  Blood pressure 124/64, pulse 65, temperature 98.2 F (36.8 C), resp. rate 18, height 4\' 10"  (1.473 m), weight 50.2 kg (110 lb 11.2 oz), SpO2 100 %.  I/O:   Intake/Output Summary (Last 24 hours) at 06/06/17 1243 Last data filed at 06/06/17 0947  Gross per 24 hour  Intake              723 ml  Output              100 ml  Net              623 ml    PHYSICAL  EXAMINATION:   Constitutional: Appears well-developed and well-nourished. No distress. HENT: Normocephalic. Marland Kitchen Oropharynx is clear and moist.  Eyes: Conjunctivae and EOM are normal. PERRLA, no scleral icterus.  Neck: Normal ROM. Neck supple. - JVD. No tracheal deviation. CVS: RRR, S1/S2 +, 2/6 SEM RSB, no gallops, no carotid bruit.  Pulmonary: crackles at the bases noted  Abdominal: Soft. BS +,  no distension, tenderness, rebound or guarding.  Musculoskeletal: Normal range of motion. Positive for edema and no tenderness.  Neuro: Alert. CN 2-12 grossly intact. No focal deficits. Skin: Skin is warm and dry.left leg shin has very small abrasion/skin tear Psychiatric: Normal mood and affect.   DATA REVIEW:   CBC  Recent Labs Lab 06/04/17 0019  WBC 5.5  HGB 12.3  HCT 36.3  PLT 200    Chemistries   Recent Labs Lab 06/06/17 0438  NA 133*  K 3.6  CL 100*  CO2 24  GLUCOSE 120*  BUN  28*  CREATININE 0.95  CALCIUM 9.0    Cardiac Enzymes  Recent Labs Lab 06/04/17 2214  TROPONINI 0.13*    Microbiology Results  Results for orders placed or performed during the hospital encounter of 07/17/16  Urine culture     Status: Abnormal   Collection Time: 07/17/16  7:24 PM  Result Value Ref Range Status   Specimen Description URINE, RANDOM  Final   Special Requests NONE  Final   Culture >=100,000 COLONIES/mL KLEBSIELLA PNEUMONIAE (A)  Final   Report Status 07/20/2016 FINAL  Final   Organism ID, Bacteria KLEBSIELLA PNEUMONIAE (A)  Final      Susceptibility   Klebsiella pneumoniae - MIC*    AMPICILLIN >=32 RESISTANT Resistant     CEFAZOLIN <=4 SENSITIVE Sensitive     CEFTRIAXONE <=1 SENSITIVE Sensitive     CIPROFLOXACIN <=0.25 SENSITIVE Sensitive     GENTAMICIN <=1 SENSITIVE Sensitive     IMIPENEM <=0.25 SENSITIVE Sensitive     NITROFURANTOIN 64 INTERMEDIATE Intermediate     TRIMETH/SULFA >=320 RESISTANT Resistant     AMPICILLIN/SULBACTAM >=32 RESISTANT Resistant      PIP/TAZO <=4 SENSITIVE Sensitive     Extended ESBL NEGATIVE Sensitive     * >=100,000 COLONIES/mL KLEBSIELLA PNEUMONIAE    RADIOLOGY:  No results found.  EKG:   Orders placed or performed during the hospital encounter of 06/03/17  . EKG 12-Lead  . EKG 12-Lead  . ED EKG within 10 minutes  . ED EKG within 10 minutes      Management plans discussed with the patient, family and they are in agreement.  CODE STATUS:     Code Status Orders        Start     Ordered   06/03/17 1809  Do not attempt resuscitation (DNR)  Continuous    Question Answer Comment  In the event of cardiac or respiratory ARREST Do not call a "code blue"   In the event of cardiac or respiratory ARREST Do not perform Intubation, CPR, defibrillation or ACLS   In the event of cardiac or respiratory ARREST Use medication by any route, position, wound care, and other measures to relive pain and suffering. May use oxygen, suction and manual treatment of airway obstruction as needed for comfort.      06/03/17 1809    Code Status History    Date Active Date Inactive Code Status Order ID Comments User Context   This patient has a current code status but no historical code status.    Advance Directive Documentation     Most Recent Value  Type of Advance Directive  Healthcare Power of Attorney, Living will  Pre-existing out of facility DNR order (yellow form or pink MOST form)  -  "MOST" Form in Place?  -      TOTAL TIME TAKING CARE OF THIS PATIENT: 35 minutes.    Vaughan Basta M.D on 06/06/2017 at 12:43 PM  Between 7am to 6pm - Pager - [phone]  After 6pm go to [email or web address] - password [email or web address] [facility]  Office  [phone]  CC: Primary care physician; Juline Patch, MD   Note: This dictation was prepared with Dragon dictation along with smaller phrase technology. Any transcriptional errors that result from this process are unintentional.

## 2017-06-06 NOTE — Progress Notes (Signed)
EMS here to transport pt to [facility]

## 2017-06-06 NOTE — Plan of Care (Signed)
Problem: Education: Goal: Knowledge of [facility] Education information/materials will improve Outcome: Progressing No complaints of pain this shift, pt did request a stool  Softener, no BM since Sunday. Also gave a dose of PRN benadryl to help sleep/ pt is incontinent, external cath in place. Pt can become confused at times, but answers questions appropriately.

## 2017-06-06 NOTE — Progress Notes (Signed)
EMS called for non emergent transport to [facility].

## 2017-06-10 ENCOUNTER — Encounter: Payer: Self-pay | Admitting: Family

## 2017-06-10 ENCOUNTER — Ambulatory Visit: Payer: Medicare Other | Attending: Family | Admitting: Family

## 2017-06-10 VITALS — BP 139/69 | HR 83 | Resp 18 | Ht 59.0 in | Wt 110.0 lb

## 2017-06-10 DIAGNOSIS — Z882 Allergy status to sulfonamides status: Secondary | ICD-10-CM | POA: Diagnosis not present

## 2017-06-10 DIAGNOSIS — I5022 Chronic systolic (congestive) heart failure: Secondary | ICD-10-CM | POA: Insufficient documentation

## 2017-06-10 DIAGNOSIS — Z8249 Family history of ischemic heart disease and other diseases of the circulatory system: Secondary | ICD-10-CM | POA: Insufficient documentation

## 2017-06-10 DIAGNOSIS — E039 Hypothyroidism, unspecified: Secondary | ICD-10-CM | POA: Insufficient documentation

## 2017-06-10 DIAGNOSIS — E119 Type 2 diabetes mellitus without complications: Secondary | ICD-10-CM | POA: Insufficient documentation

## 2017-06-10 DIAGNOSIS — Z7982 Long term (current) use of aspirin: Secondary | ICD-10-CM | POA: Diagnosis not present

## 2017-06-10 DIAGNOSIS — Z7984 Long term (current) use of oral hypoglycemic drugs: Secondary | ICD-10-CM | POA: Diagnosis not present

## 2017-06-10 DIAGNOSIS — Z79899 Other long term (current) drug therapy: Secondary | ICD-10-CM | POA: Insufficient documentation

## 2017-06-10 DIAGNOSIS — I110 Hypertensive heart disease with heart failure: Secondary | ICD-10-CM | POA: Insufficient documentation

## 2017-06-10 DIAGNOSIS — E785 Hyperlipidemia, unspecified: Secondary | ICD-10-CM | POA: Diagnosis not present

## 2017-06-10 DIAGNOSIS — Z86718 Personal history of other venous thrombosis and embolism: Secondary | ICD-10-CM | POA: Insufficient documentation

## 2017-06-10 DIAGNOSIS — I10 Essential (primary) hypertension: Secondary | ICD-10-CM

## 2017-06-10 NOTE — Patient Instructions (Addendum)
Begin weighing daily and call for an overnight weight gain of > 2 pounds or a weekly weight gain of >5 pounds. 

## 2017-06-10 NOTE — Progress Notes (Signed)
Patient ID: Lorraine Fischer, female    DOB: 1923/04/24, 83 y.o.   MRN: [id number]  HPI  Ms Lorraine Fischer is a 83 y/o female with a history of diabetes, DVT, hyperlipidemia, HTN, hypothyroidism and chronic heart failure.  Echo report from 06/04/17 reviewed and shows an EF of 25-30% along with mild AR, moderate MR and mod/severe TR.   Admitted 06/03/17 due to HF exacerbation. Initially needed IV lasix and transitioned to oral lasix. Cardiology consult was obtained. Discharged home after 3 days.   She presents today for her initial visit with a chief complaint of moderate shortness of breath with minimal exertion. She describes this as chronic in nature having been present for several years with varying levels of severity. She has associated fatigue, chest tightness, light-headedness and anxiety along with this. She denies any edema, chest pain or palpitations.  Past Medical History:  Diagnosis Date  . Diabetes mellitus without complication ([place])   . Hyperlipidemia   . Hypertension   . Thyroid disease    Past Surgical History:  Procedure Laterality Date  . ANKLE SURGERY    . HIP FRACTURE SURGERY    . WRIST SURGERY Right    Family History  Problem Relation Age of Onset  . Hypertension Mother    Social History  Substance Use Topics  . Smoking status: Never Smoker  . Smokeless tobacco: Never Used  . Alcohol use No   Allergies  Allergen Reactions  . Sulfa Antibiotics Shortness Of Breath  . Sulfasalazine Shortness Of Breath   Prior to Admission medications   Medication Sig Start Date End Date Taking? Authorizing Provider  aspirin 81 MG chewable tablet Chew 81 mg by mouth daily.   Yes [provider]  docusate sodium (COLACE) 100 MG capsule Take 1 capsule (100 mg total) by mouth 2 (two) times daily as needed for mild constipation. 06/06/17  Yes Vaughan Basta, MD  furosemide (LASIX) 20 MG tablet Take 1 tablet (20 mg total) by mouth daily. 03/06/17  Yes Juline Patch, MD   levothyroxine (SYNTHROID, LEVOTHROID) 50 MCG tablet Take 1 tablet (50 mcg total) by mouth daily before breakfast. 02/27/17  Yes Juline Patch, MD  lisinopril (PRINIVIL,ZESTRIL) 10 MG tablet Take 1 tablet (10 mg total) by mouth daily. 06/07/17  Yes Vaughan Basta, MD  lovastatin (MEVACOR) 40 MG tablet Take 1 tablet (40 mg total) by mouth daily. 02/27/17  Yes Juline Patch, MD  metFORMIN (GLUCOPHAGE) 500 MG tablet TAKE ONE TABLET BY MOUTH ONCE DAILY WITH BREAKFAT 02/27/17  Yes Juline Patch, MD  metoprolol succinate (TOPROL-XL) 50 MG 24 hr tablet Take 1 tablet (50 mg total) by mouth daily. Take with or immediately following a meal. 02/27/17  Yes Juline Patch, MD  spironolactone (ALDACTONE) 25 MG tablet Take 0.5 tablets (12.5 mg total) by mouth daily. 06/07/17  Yes Vaughan Basta, MD    Review of Systems  Constitutional: Positive for fatigue. Negative for appetite change.  HENT: Negative for congestion, rhinorrhea and sore throat.   Eyes: Negative.   Respiratory: Positive for chest tightness and shortness of breath. Negative for cough.   Cardiovascular: Negative for chest pain, palpitations and leg swelling.  Gastrointestinal: Negative for abdominal distention and abdominal pain.  Endocrine: Negative.   Genitourinary: Negative.   Musculoskeletal: Negative for back pain and neck pain.  Skin: Negative.   Allergic/Immunologic: Negative.   Neurological: Positive for light-headedness. Negative for dizziness.  Hematological: Negative for adenopathy. Does not bruise/bleed easily.  Psychiatric/Behavioral: Negative  for dysphoric mood and sleep disturbance. The patient is nervous/anxious.    Vitals:   06/10/17 1415  BP: 139/69  Pulse: 83  Resp: 18  SpO2: 96%  Weight: 110 lb (49.9 kg)  Height: 4\' 11"  (1.499 m)   Wt Readings from Last 3 Encounters:  06/10/17 110 lb (49.9 kg)  06/06/17 110 lb 11.2 oz (50.2 kg)  04/04/17 110 lb (49.9 kg)   Lab Results  Component Value  Date   CREATININE 0.95 06/06/2017   CREATININE 0.96 06/05/2017   CREATININE 1.01 (H) 06/04/2017   Physical Exam  Constitutional: She is oriented to person, place, and time. She appears well-developed and well-nourished.  HENT:  Head: Normocephalic and atraumatic.  Neck: Normal range of motion. Neck supple. No JVD present.  Cardiovascular: Normal rate and regular rhythm.   Pulmonary/Chest: Effort normal. She has no wheezes. She has no rales.  Abdominal: Soft. She exhibits no distension. There is no tenderness.  Musculoskeletal: She exhibits no edema or tenderness.  Neurological: She is alert and oriented to person, place, and time.  Skin: Skin is warm and dry.  Psychiatric: She has a normal mood and affect. Her behavior is normal. Thought content normal.  Nursing note and vitals reviewed.  Assessment & Plan:  1: Chronic heart failure with reduced ejection fraction- - NYHA class III - euvolemic today - not being weighed daily and an order was written for her to be weighed daily and to have the staff call for an overnight weight gain >2 pounds or a weekly weight gain of >5 pounds - not adding salt. Reviewed the importance of following a 2000mg  sodium diet and written dietary information was given to her about this. - saw cardiologist (Fath) 04/25/17) and returns December 2018 - saw pulmonologist Raul Del) 04/18/17 and returns February 2019 - consider changing her lisinopril to entresto and/or titrating up metoprolol  2: HTN- - BP looks good today - BMP from 06/05/17 reviewed and shows sodium 137, potassium 3.7 and GFR 50 - currently sees facility provider for primary care issues  3: Diabetes- - glucose today was 111 - A1c on 12/05/16 was 6.2% - continue metformin  Facility medication list was reviewed.  Return here in 1 month or sooner for any questions/problems before then.

## 2017-06-11 DIAGNOSIS — L603 Nail dystrophy: Secondary | ICD-10-CM | POA: Diagnosis not present

## 2017-06-12 ENCOUNTER — Encounter: Payer: Self-pay | Admitting: Family

## 2017-06-12 DIAGNOSIS — E119 Type 2 diabetes mellitus without complications: Secondary | ICD-10-CM | POA: Insufficient documentation

## 2017-06-12 DIAGNOSIS — I5022 Chronic systolic (congestive) heart failure: Secondary | ICD-10-CM | POA: Insufficient documentation

## 2017-06-14 DIAGNOSIS — I420 Dilated cardiomyopathy: Secondary | ICD-10-CM | POA: Diagnosis not present

## 2017-06-14 DIAGNOSIS — I493 Ventricular premature depolarization: Secondary | ICD-10-CM | POA: Diagnosis not present

## 2017-06-14 DIAGNOSIS — R002 Palpitations: Secondary | ICD-10-CM | POA: Diagnosis not present

## 2017-06-14 DIAGNOSIS — I10 Essential (primary) hypertension: Secondary | ICD-10-CM | POA: Diagnosis not present

## 2017-06-14 DIAGNOSIS — I5020 Unspecified systolic (congestive) heart failure: Secondary | ICD-10-CM | POA: Diagnosis not present

## 2017-06-21 ENCOUNTER — Ambulatory Visit (INDEPENDENT_AMBULATORY_CARE_PROVIDER_SITE_OTHER): Payer: Medicare Other | Admitting: Vascular Surgery

## 2017-06-28 DIAGNOSIS — E785 Hyperlipidemia, unspecified: Secondary | ICD-10-CM | POA: Diagnosis not present

## 2017-06-28 DIAGNOSIS — I5031 Acute diastolic (congestive) heart failure: Secondary | ICD-10-CM | POA: Diagnosis not present

## 2017-06-28 DIAGNOSIS — I110 Hypertensive heart disease with heart failure: Secondary | ICD-10-CM | POA: Diagnosis not present

## 2017-06-28 DIAGNOSIS — E119 Type 2 diabetes mellitus without complications: Secondary | ICD-10-CM | POA: Diagnosis not present

## 2017-06-28 DIAGNOSIS — E039 Hypothyroidism, unspecified: Secondary | ICD-10-CM | POA: Diagnosis not present

## 2017-06-28 DIAGNOSIS — Z9181 History of falling: Secondary | ICD-10-CM | POA: Diagnosis not present

## 2017-06-28 DIAGNOSIS — R2689 Other abnormalities of gait and mobility: Secondary | ICD-10-CM | POA: Diagnosis not present

## 2017-06-28 DIAGNOSIS — Z86718 Personal history of other venous thrombosis and embolism: Secondary | ICD-10-CM | POA: Diagnosis not present

## 2017-06-28 DIAGNOSIS — Z7984 Long term (current) use of oral hypoglycemic drugs: Secondary | ICD-10-CM | POA: Diagnosis not present

## 2017-06-28 DIAGNOSIS — I5023 Acute on chronic systolic (congestive) heart failure: Secondary | ICD-10-CM | POA: Diagnosis not present

## 2017-07-01 DIAGNOSIS — E039 Hypothyroidism, unspecified: Secondary | ICD-10-CM | POA: Diagnosis not present

## 2017-07-01 DIAGNOSIS — Z7984 Long term (current) use of oral hypoglycemic drugs: Secondary | ICD-10-CM | POA: Diagnosis not present

## 2017-07-01 DIAGNOSIS — E119 Type 2 diabetes mellitus without complications: Secondary | ICD-10-CM | POA: Diagnosis not present

## 2017-07-01 DIAGNOSIS — R2689 Other abnormalities of gait and mobility: Secondary | ICD-10-CM | POA: Diagnosis not present

## 2017-07-01 DIAGNOSIS — Z9181 History of falling: Secondary | ICD-10-CM | POA: Diagnosis not present

## 2017-07-01 DIAGNOSIS — E785 Hyperlipidemia, unspecified: Secondary | ICD-10-CM | POA: Diagnosis not present

## 2017-07-01 DIAGNOSIS — I5023 Acute on chronic systolic (congestive) heart failure: Secondary | ICD-10-CM | POA: Diagnosis not present

## 2017-07-01 DIAGNOSIS — Z86718 Personal history of other venous thrombosis and embolism: Secondary | ICD-10-CM | POA: Diagnosis not present

## 2017-07-01 DIAGNOSIS — I5031 Acute diastolic (congestive) heart failure: Secondary | ICD-10-CM | POA: Diagnosis not present

## 2017-07-01 DIAGNOSIS — I110 Hypertensive heart disease with heart failure: Secondary | ICD-10-CM | POA: Diagnosis not present

## 2017-07-02 DIAGNOSIS — E785 Hyperlipidemia, unspecified: Secondary | ICD-10-CM | POA: Diagnosis not present

## 2017-07-02 DIAGNOSIS — R2689 Other abnormalities of gait and mobility: Secondary | ICD-10-CM | POA: Diagnosis not present

## 2017-07-02 DIAGNOSIS — Z9181 History of falling: Secondary | ICD-10-CM | POA: Diagnosis not present

## 2017-07-02 DIAGNOSIS — Z7984 Long term (current) use of oral hypoglycemic drugs: Secondary | ICD-10-CM | POA: Diagnosis not present

## 2017-07-02 DIAGNOSIS — I5031 Acute diastolic (congestive) heart failure: Secondary | ICD-10-CM | POA: Diagnosis not present

## 2017-07-02 DIAGNOSIS — Z86718 Personal history of other venous thrombosis and embolism: Secondary | ICD-10-CM | POA: Diagnosis not present

## 2017-07-02 DIAGNOSIS — E119 Type 2 diabetes mellitus without complications: Secondary | ICD-10-CM | POA: Diagnosis not present

## 2017-07-02 DIAGNOSIS — E039 Hypothyroidism, unspecified: Secondary | ICD-10-CM | POA: Diagnosis not present

## 2017-07-02 DIAGNOSIS — I110 Hypertensive heart disease with heart failure: Secondary | ICD-10-CM | POA: Diagnosis not present

## 2017-07-02 DIAGNOSIS — I5023 Acute on chronic systolic (congestive) heart failure: Secondary | ICD-10-CM | POA: Diagnosis not present

## 2017-07-03 DIAGNOSIS — I110 Hypertensive heart disease with heart failure: Secondary | ICD-10-CM | POA: Diagnosis not present

## 2017-07-03 DIAGNOSIS — Z9181 History of falling: Secondary | ICD-10-CM | POA: Diagnosis not present

## 2017-07-03 DIAGNOSIS — E785 Hyperlipidemia, unspecified: Secondary | ICD-10-CM | POA: Diagnosis not present

## 2017-07-03 DIAGNOSIS — E119 Type 2 diabetes mellitus without complications: Secondary | ICD-10-CM | POA: Diagnosis not present

## 2017-07-03 DIAGNOSIS — R2689 Other abnormalities of gait and mobility: Secondary | ICD-10-CM | POA: Diagnosis not present

## 2017-07-03 DIAGNOSIS — I5031 Acute diastolic (congestive) heart failure: Secondary | ICD-10-CM | POA: Diagnosis not present

## 2017-07-03 DIAGNOSIS — Z7984 Long term (current) use of oral hypoglycemic drugs: Secondary | ICD-10-CM | POA: Diagnosis not present

## 2017-07-03 DIAGNOSIS — Z86718 Personal history of other venous thrombosis and embolism: Secondary | ICD-10-CM | POA: Diagnosis not present

## 2017-07-03 DIAGNOSIS — E039 Hypothyroidism, unspecified: Secondary | ICD-10-CM | POA: Diagnosis not present

## 2017-07-03 DIAGNOSIS — I5023 Acute on chronic systolic (congestive) heart failure: Secondary | ICD-10-CM | POA: Diagnosis not present

## 2017-07-05 DIAGNOSIS — E785 Hyperlipidemia, unspecified: Secondary | ICD-10-CM | POA: Diagnosis not present

## 2017-07-05 DIAGNOSIS — I110 Hypertensive heart disease with heart failure: Secondary | ICD-10-CM | POA: Diagnosis not present

## 2017-07-05 DIAGNOSIS — Z7984 Long term (current) use of oral hypoglycemic drugs: Secondary | ICD-10-CM | POA: Diagnosis not present

## 2017-07-05 DIAGNOSIS — I5023 Acute on chronic systolic (congestive) heart failure: Secondary | ICD-10-CM | POA: Diagnosis not present

## 2017-07-05 DIAGNOSIS — E119 Type 2 diabetes mellitus without complications: Secondary | ICD-10-CM | POA: Diagnosis not present

## 2017-07-05 DIAGNOSIS — E039 Hypothyroidism, unspecified: Secondary | ICD-10-CM | POA: Diagnosis not present

## 2017-07-05 DIAGNOSIS — I5031 Acute diastolic (congestive) heart failure: Secondary | ICD-10-CM | POA: Diagnosis not present

## 2017-07-05 DIAGNOSIS — Z86718 Personal history of other venous thrombosis and embolism: Secondary | ICD-10-CM | POA: Diagnosis not present

## 2017-07-05 DIAGNOSIS — R2689 Other abnormalities of gait and mobility: Secondary | ICD-10-CM | POA: Diagnosis not present

## 2017-07-05 DIAGNOSIS — Z9181 History of falling: Secondary | ICD-10-CM | POA: Diagnosis not present

## 2017-07-08 ENCOUNTER — Other Ambulatory Visit (INDEPENDENT_AMBULATORY_CARE_PROVIDER_SITE_OTHER): Payer: Self-pay | Admitting: Vascular Surgery

## 2017-07-08 DIAGNOSIS — E119 Type 2 diabetes mellitus without complications: Secondary | ICD-10-CM | POA: Diagnosis not present

## 2017-07-08 DIAGNOSIS — Z7984 Long term (current) use of oral hypoglycemic drugs: Secondary | ICD-10-CM | POA: Diagnosis not present

## 2017-07-08 DIAGNOSIS — I110 Hypertensive heart disease with heart failure: Secondary | ICD-10-CM | POA: Diagnosis not present

## 2017-07-08 DIAGNOSIS — E785 Hyperlipidemia, unspecified: Secondary | ICD-10-CM | POA: Diagnosis not present

## 2017-07-08 DIAGNOSIS — Z86718 Personal history of other venous thrombosis and embolism: Secondary | ICD-10-CM | POA: Diagnosis not present

## 2017-07-08 DIAGNOSIS — Z9181 History of falling: Secondary | ICD-10-CM | POA: Diagnosis not present

## 2017-07-08 DIAGNOSIS — E039 Hypothyroidism, unspecified: Secondary | ICD-10-CM | POA: Diagnosis not present

## 2017-07-08 DIAGNOSIS — I5023 Acute on chronic systolic (congestive) heart failure: Secondary | ICD-10-CM | POA: Diagnosis not present

## 2017-07-08 DIAGNOSIS — R2689 Other abnormalities of gait and mobility: Secondary | ICD-10-CM | POA: Diagnosis not present

## 2017-07-08 DIAGNOSIS — I5031 Acute diastolic (congestive) heart failure: Secondary | ICD-10-CM | POA: Diagnosis not present

## 2017-07-09 DIAGNOSIS — I5023 Acute on chronic systolic (congestive) heart failure: Secondary | ICD-10-CM | POA: Diagnosis not present

## 2017-07-09 DIAGNOSIS — I110 Hypertensive heart disease with heart failure: Secondary | ICD-10-CM | POA: Diagnosis not present

## 2017-07-09 DIAGNOSIS — Z7984 Long term (current) use of oral hypoglycemic drugs: Secondary | ICD-10-CM | POA: Diagnosis not present

## 2017-07-09 DIAGNOSIS — Z9181 History of falling: Secondary | ICD-10-CM | POA: Diagnosis not present

## 2017-07-09 DIAGNOSIS — E785 Hyperlipidemia, unspecified: Secondary | ICD-10-CM | POA: Diagnosis not present

## 2017-07-09 DIAGNOSIS — R2689 Other abnormalities of gait and mobility: Secondary | ICD-10-CM | POA: Diagnosis not present

## 2017-07-09 DIAGNOSIS — I5031 Acute diastolic (congestive) heart failure: Secondary | ICD-10-CM | POA: Diagnosis not present

## 2017-07-09 DIAGNOSIS — E039 Hypothyroidism, unspecified: Secondary | ICD-10-CM | POA: Diagnosis not present

## 2017-07-09 DIAGNOSIS — E119 Type 2 diabetes mellitus without complications: Secondary | ICD-10-CM | POA: Diagnosis not present

## 2017-07-09 DIAGNOSIS — Z86718 Personal history of other venous thrombosis and embolism: Secondary | ICD-10-CM | POA: Diagnosis not present

## 2017-07-10 DIAGNOSIS — Z7984 Long term (current) use of oral hypoglycemic drugs: Secondary | ICD-10-CM | POA: Diagnosis not present

## 2017-07-10 DIAGNOSIS — I5023 Acute on chronic systolic (congestive) heart failure: Secondary | ICD-10-CM | POA: Diagnosis not present

## 2017-07-10 DIAGNOSIS — I5031 Acute diastolic (congestive) heart failure: Secondary | ICD-10-CM | POA: Diagnosis not present

## 2017-07-10 DIAGNOSIS — I110 Hypertensive heart disease with heart failure: Secondary | ICD-10-CM | POA: Diagnosis not present

## 2017-07-10 DIAGNOSIS — E785 Hyperlipidemia, unspecified: Secondary | ICD-10-CM | POA: Diagnosis not present

## 2017-07-10 DIAGNOSIS — Z86718 Personal history of other venous thrombosis and embolism: Secondary | ICD-10-CM | POA: Diagnosis not present

## 2017-07-10 DIAGNOSIS — R2689 Other abnormalities of gait and mobility: Secondary | ICD-10-CM | POA: Diagnosis not present

## 2017-07-10 DIAGNOSIS — Z9181 History of falling: Secondary | ICD-10-CM | POA: Diagnosis not present

## 2017-07-10 DIAGNOSIS — E119 Type 2 diabetes mellitus without complications: Secondary | ICD-10-CM | POA: Diagnosis not present

## 2017-07-10 DIAGNOSIS — E039 Hypothyroidism, unspecified: Secondary | ICD-10-CM | POA: Diagnosis not present

## 2017-07-15 ENCOUNTER — Telehealth: Payer: Self-pay | Admitting: Family

## 2017-07-15 ENCOUNTER — Ambulatory Visit: Payer: Medicare Other | Admitting: Family

## 2017-07-15 DIAGNOSIS — Z9181 History of falling: Secondary | ICD-10-CM | POA: Diagnosis not present

## 2017-07-15 DIAGNOSIS — I5031 Acute diastolic (congestive) heart failure: Secondary | ICD-10-CM | POA: Diagnosis not present

## 2017-07-15 DIAGNOSIS — R2689 Other abnormalities of gait and mobility: Secondary | ICD-10-CM | POA: Diagnosis not present

## 2017-07-15 DIAGNOSIS — Z7984 Long term (current) use of oral hypoglycemic drugs: Secondary | ICD-10-CM | POA: Diagnosis not present

## 2017-07-15 DIAGNOSIS — E785 Hyperlipidemia, unspecified: Secondary | ICD-10-CM | POA: Diagnosis not present

## 2017-07-15 DIAGNOSIS — E039 Hypothyroidism, unspecified: Secondary | ICD-10-CM | POA: Diagnosis not present

## 2017-07-15 DIAGNOSIS — I5023 Acute on chronic systolic (congestive) heart failure: Secondary | ICD-10-CM | POA: Diagnosis not present

## 2017-07-15 DIAGNOSIS — Z86718 Personal history of other venous thrombosis and embolism: Secondary | ICD-10-CM | POA: Diagnosis not present

## 2017-07-15 DIAGNOSIS — E119 Type 2 diabetes mellitus without complications: Secondary | ICD-10-CM | POA: Diagnosis not present

## 2017-07-15 DIAGNOSIS — I110 Hypertensive heart disease with heart failure: Secondary | ICD-10-CM | POA: Diagnosis not present

## 2017-07-15 NOTE — Telephone Encounter (Signed)
Patient did not show for her Heart Failure Clinic appointment on 07/15/17. Will attempt to reschedule.

## 2017-07-15 NOTE — Progress Notes (Deleted)
Patient ID: Lorraine Fischer, female    DOB: 1922-12-08, 83 y.o.   MRN: [id number]  HPI  Ms Alberts is a 83 y/o female with a history of diabetes, DVT, hyperlipidemia, HTN, hypothyroidism and chronic heart failure.  Echo report from 06/04/17 reviewed and shows an EF of 25-30% along with mild AR, moderate MR and mod/severe TR.   Admitted 06/03/17 due to HF exacerbation. Initially needed IV lasix and transitioned to oral lasix. Cardiology consult was obtained. Discharged home after 3 days.   She presents today for a follow-up visit with a chief complaint of   Past Medical History:  Diagnosis Date  . Diabetes mellitus without complication ([place])   . DVT (deep venous thrombosis) ([place])   . Hyperlipidemia   . Hypertension   . Thyroid disease    Past Surgical History:  Procedure Laterality Date  . ANKLE SURGERY    . HIP FRACTURE SURGERY    . WRIST SURGERY Right    Family History  Problem Relation Age of Onset  . Hypertension Mother    Social History   Tobacco Use  . Smoking status: Never Smoker  . Smokeless tobacco: Never Used  Substance Use Topics  . Alcohol use: No   Allergies  Allergen Reactions  . Sulfa Antibiotics Shortness Of Breath  . Sulfasalazine Shortness Of Breath     Review of Systems  Constitutional: Positive for fatigue. Negative for appetite change.  HENT: Negative for congestion, rhinorrhea and sore throat.   Eyes: Negative.   Respiratory: Positive for chest tightness and shortness of breath. Negative for cough.   Cardiovascular: Negative for chest pain, palpitations and leg swelling.  Gastrointestinal: Negative for abdominal distention and abdominal pain.  Endocrine: Negative.   Genitourinary: Negative.   Musculoskeletal: Negative for back pain and neck pain.  Skin: Negative.   Allergic/Immunologic: Negative.   Neurological: Positive for light-headedness. Negative for dizziness.  Hematological: Negative for adenopathy. Does not bruise/bleed easily.   Psychiatric/Behavioral: Negative for dysphoric mood and sleep disturbance. The patient is nervous/anxious.     Lab Results  Component Value Date   CREATININE 0.95 06/06/2017   CREATININE 0.96 06/05/2017   CREATININE 1.01 (H) 06/04/2017   Physical Exam  Constitutional: She is oriented to person, place, and time. She appears well-developed and well-nourished.  HENT:  Head: Normocephalic and atraumatic.  Neck: Normal range of motion. Neck supple. No JVD present.  Cardiovascular: Normal rate and regular rhythm.  Pulmonary/Chest: Effort normal. She has no wheezes. She has no rales.  Abdominal: Soft. She exhibits no distension. There is no tenderness.  Musculoskeletal: She exhibits no edema or tenderness.  Neurological: She is alert and oriented to person, place, and time.  Skin: Skin is warm and dry.  Psychiatric: She has a normal mood and affect. Her behavior is normal. Thought content normal.  Nursing note and vitals reviewed.  Assessment & Plan:  1: Chronic heart failure with reduced ejection fraction- - NYHA class III - euvolemic today - not being weighed daily and an order was written for her to be weighed daily and to have the staff call for an overnight weight gain >2 pounds or a weekly weight gain of >5 pounds - not adding salt. Reviewed the importance of following a 2000mg  sodium diet and written dietary information was given to her about this. - saw cardiologist Ubaldo Glassing) 04/25/17) and returns December 2018 - saw pulmonologist Raul Del) 04/18/17 and returns February 2019 - consider changing her lisinopril to entresto and/or titrating up metoprolol  2: HTN- - BP looks good today - BMP from 06/06/17 reviewed and shows sodium 133, potassium 3.6 and GFR 50 - currently sees facility provider for primary care issues  3: Diabetes- - glucose today was  - A1c on 12/05/16 was 6.2% - continue metformin  Facility medication list was reviewed.

## 2017-07-17 DIAGNOSIS — Z86718 Personal history of other venous thrombosis and embolism: Secondary | ICD-10-CM | POA: Diagnosis not present

## 2017-07-17 DIAGNOSIS — I5031 Acute diastolic (congestive) heart failure: Secondary | ICD-10-CM | POA: Diagnosis not present

## 2017-07-17 DIAGNOSIS — E119 Type 2 diabetes mellitus without complications: Secondary | ICD-10-CM | POA: Diagnosis not present

## 2017-07-17 DIAGNOSIS — E785 Hyperlipidemia, unspecified: Secondary | ICD-10-CM | POA: Diagnosis not present

## 2017-07-17 DIAGNOSIS — R2689 Other abnormalities of gait and mobility: Secondary | ICD-10-CM | POA: Diagnosis not present

## 2017-07-17 DIAGNOSIS — I5023 Acute on chronic systolic (congestive) heart failure: Secondary | ICD-10-CM | POA: Diagnosis not present

## 2017-07-17 DIAGNOSIS — Z7984 Long term (current) use of oral hypoglycemic drugs: Secondary | ICD-10-CM | POA: Diagnosis not present

## 2017-07-17 DIAGNOSIS — I110 Hypertensive heart disease with heart failure: Secondary | ICD-10-CM | POA: Diagnosis not present

## 2017-07-17 DIAGNOSIS — Z9181 History of falling: Secondary | ICD-10-CM | POA: Diagnosis not present

## 2017-07-17 DIAGNOSIS — E039 Hypothyroidism, unspecified: Secondary | ICD-10-CM | POA: Diagnosis not present

## 2017-07-19 DIAGNOSIS — E785 Hyperlipidemia, unspecified: Secondary | ICD-10-CM | POA: Diagnosis not present

## 2017-07-19 DIAGNOSIS — I110 Hypertensive heart disease with heart failure: Secondary | ICD-10-CM | POA: Diagnosis not present

## 2017-07-19 DIAGNOSIS — E039 Hypothyroidism, unspecified: Secondary | ICD-10-CM | POA: Diagnosis not present

## 2017-07-19 DIAGNOSIS — Z86718 Personal history of other venous thrombosis and embolism: Secondary | ICD-10-CM | POA: Diagnosis not present

## 2017-07-19 DIAGNOSIS — I5031 Acute diastolic (congestive) heart failure: Secondary | ICD-10-CM | POA: Diagnosis not present

## 2017-07-19 DIAGNOSIS — R2689 Other abnormalities of gait and mobility: Secondary | ICD-10-CM | POA: Diagnosis not present

## 2017-07-19 DIAGNOSIS — Z9181 History of falling: Secondary | ICD-10-CM | POA: Diagnosis not present

## 2017-07-19 DIAGNOSIS — E119 Type 2 diabetes mellitus without complications: Secondary | ICD-10-CM | POA: Diagnosis not present

## 2017-07-19 DIAGNOSIS — Z7984 Long term (current) use of oral hypoglycemic drugs: Secondary | ICD-10-CM | POA: Diagnosis not present

## 2017-07-19 DIAGNOSIS — I5023 Acute on chronic systolic (congestive) heart failure: Secondary | ICD-10-CM | POA: Diagnosis not present

## 2017-07-22 DIAGNOSIS — E039 Hypothyroidism, unspecified: Secondary | ICD-10-CM | POA: Diagnosis not present

## 2017-07-22 DIAGNOSIS — Z7984 Long term (current) use of oral hypoglycemic drugs: Secondary | ICD-10-CM | POA: Diagnosis not present

## 2017-07-22 DIAGNOSIS — I5031 Acute diastolic (congestive) heart failure: Secondary | ICD-10-CM | POA: Diagnosis not present

## 2017-07-22 DIAGNOSIS — E785 Hyperlipidemia, unspecified: Secondary | ICD-10-CM | POA: Diagnosis not present

## 2017-07-22 DIAGNOSIS — Z9181 History of falling: Secondary | ICD-10-CM | POA: Diagnosis not present

## 2017-07-22 DIAGNOSIS — I5023 Acute on chronic systolic (congestive) heart failure: Secondary | ICD-10-CM | POA: Diagnosis not present

## 2017-07-22 DIAGNOSIS — I110 Hypertensive heart disease with heart failure: Secondary | ICD-10-CM | POA: Diagnosis not present

## 2017-07-22 DIAGNOSIS — R2689 Other abnormalities of gait and mobility: Secondary | ICD-10-CM | POA: Diagnosis not present

## 2017-07-22 DIAGNOSIS — Z86718 Personal history of other venous thrombosis and embolism: Secondary | ICD-10-CM | POA: Diagnosis not present

## 2017-07-22 DIAGNOSIS — E119 Type 2 diabetes mellitus without complications: Secondary | ICD-10-CM | POA: Diagnosis not present

## 2017-07-23 DIAGNOSIS — I420 Dilated cardiomyopathy: Secondary | ICD-10-CM | POA: Diagnosis not present

## 2017-07-23 DIAGNOSIS — I5023 Acute on chronic systolic (congestive) heart failure: Secondary | ICD-10-CM | POA: Diagnosis not present

## 2017-07-23 DIAGNOSIS — Z9181 History of falling: Secondary | ICD-10-CM | POA: Diagnosis not present

## 2017-07-23 DIAGNOSIS — I10 Essential (primary) hypertension: Secondary | ICD-10-CM | POA: Diagnosis not present

## 2017-07-23 DIAGNOSIS — Z7984 Long term (current) use of oral hypoglycemic drugs: Secondary | ICD-10-CM | POA: Diagnosis not present

## 2017-07-23 DIAGNOSIS — I110 Hypertensive heart disease with heart failure: Secondary | ICD-10-CM | POA: Diagnosis not present

## 2017-07-23 DIAGNOSIS — R2689 Other abnormalities of gait and mobility: Secondary | ICD-10-CM | POA: Diagnosis not present

## 2017-07-23 DIAGNOSIS — Z86718 Personal history of other venous thrombosis and embolism: Secondary | ICD-10-CM | POA: Diagnosis not present

## 2017-07-23 DIAGNOSIS — I493 Ventricular premature depolarization: Secondary | ICD-10-CM | POA: Diagnosis not present

## 2017-07-23 DIAGNOSIS — E785 Hyperlipidemia, unspecified: Secondary | ICD-10-CM | POA: Diagnosis not present

## 2017-07-23 DIAGNOSIS — E119 Type 2 diabetes mellitus without complications: Secondary | ICD-10-CM | POA: Diagnosis not present

## 2017-07-23 DIAGNOSIS — E039 Hypothyroidism, unspecified: Secondary | ICD-10-CM | POA: Diagnosis not present

## 2017-07-23 DIAGNOSIS — I5031 Acute diastolic (congestive) heart failure: Secondary | ICD-10-CM | POA: Diagnosis not present

## 2017-07-24 DIAGNOSIS — Z86718 Personal history of other venous thrombosis and embolism: Secondary | ICD-10-CM | POA: Diagnosis not present

## 2017-07-24 DIAGNOSIS — E039 Hypothyroidism, unspecified: Secondary | ICD-10-CM | POA: Diagnosis not present

## 2017-07-24 DIAGNOSIS — I5031 Acute diastolic (congestive) heart failure: Secondary | ICD-10-CM | POA: Diagnosis not present

## 2017-07-24 DIAGNOSIS — R2689 Other abnormalities of gait and mobility: Secondary | ICD-10-CM | POA: Diagnosis not present

## 2017-07-24 DIAGNOSIS — Z7984 Long term (current) use of oral hypoglycemic drugs: Secondary | ICD-10-CM | POA: Diagnosis not present

## 2017-07-24 DIAGNOSIS — E785 Hyperlipidemia, unspecified: Secondary | ICD-10-CM | POA: Diagnosis not present

## 2017-07-24 DIAGNOSIS — I5023 Acute on chronic systolic (congestive) heart failure: Secondary | ICD-10-CM | POA: Diagnosis not present

## 2017-07-24 DIAGNOSIS — I110 Hypertensive heart disease with heart failure: Secondary | ICD-10-CM | POA: Diagnosis not present

## 2017-07-24 DIAGNOSIS — E119 Type 2 diabetes mellitus without complications: Secondary | ICD-10-CM | POA: Diagnosis not present

## 2017-07-24 DIAGNOSIS — Z9181 History of falling: Secondary | ICD-10-CM | POA: Diagnosis not present

## 2017-07-25 DIAGNOSIS — I110 Hypertensive heart disease with heart failure: Secondary | ICD-10-CM | POA: Diagnosis not present

## 2017-07-25 DIAGNOSIS — R2689 Other abnormalities of gait and mobility: Secondary | ICD-10-CM | POA: Diagnosis not present

## 2017-07-25 DIAGNOSIS — Z86718 Personal history of other venous thrombosis and embolism: Secondary | ICD-10-CM | POA: Diagnosis not present

## 2017-07-25 DIAGNOSIS — I5031 Acute diastolic (congestive) heart failure: Secondary | ICD-10-CM | POA: Diagnosis not present

## 2017-07-25 DIAGNOSIS — Z9181 History of falling: Secondary | ICD-10-CM | POA: Diagnosis not present

## 2017-07-25 DIAGNOSIS — E119 Type 2 diabetes mellitus without complications: Secondary | ICD-10-CM | POA: Diagnosis not present

## 2017-07-25 DIAGNOSIS — Z7984 Long term (current) use of oral hypoglycemic drugs: Secondary | ICD-10-CM | POA: Diagnosis not present

## 2017-07-25 DIAGNOSIS — E785 Hyperlipidemia, unspecified: Secondary | ICD-10-CM | POA: Diagnosis not present

## 2017-07-25 DIAGNOSIS — I5023 Acute on chronic systolic (congestive) heart failure: Secondary | ICD-10-CM | POA: Diagnosis not present

## 2017-07-25 DIAGNOSIS — E039 Hypothyroidism, unspecified: Secondary | ICD-10-CM | POA: Diagnosis not present

## 2017-07-30 DIAGNOSIS — E785 Hyperlipidemia, unspecified: Secondary | ICD-10-CM | POA: Diagnosis not present

## 2017-07-30 DIAGNOSIS — E119 Type 2 diabetes mellitus without complications: Secondary | ICD-10-CM | POA: Diagnosis not present

## 2017-07-30 DIAGNOSIS — Z86718 Personal history of other venous thrombosis and embolism: Secondary | ICD-10-CM | POA: Diagnosis not present

## 2017-07-30 DIAGNOSIS — R2689 Other abnormalities of gait and mobility: Secondary | ICD-10-CM | POA: Diagnosis not present

## 2017-07-30 DIAGNOSIS — I5023 Acute on chronic systolic (congestive) heart failure: Secondary | ICD-10-CM | POA: Diagnosis not present

## 2017-07-30 DIAGNOSIS — Z9181 History of falling: Secondary | ICD-10-CM | POA: Diagnosis not present

## 2017-07-30 DIAGNOSIS — Z7984 Long term (current) use of oral hypoglycemic drugs: Secondary | ICD-10-CM | POA: Diagnosis not present

## 2017-07-30 DIAGNOSIS — I5031 Acute diastolic (congestive) heart failure: Secondary | ICD-10-CM | POA: Diagnosis not present

## 2017-07-30 DIAGNOSIS — E039 Hypothyroidism, unspecified: Secondary | ICD-10-CM | POA: Diagnosis not present

## 2017-07-30 DIAGNOSIS — I110 Hypertensive heart disease with heart failure: Secondary | ICD-10-CM | POA: Diagnosis not present

## 2017-07-31 DIAGNOSIS — Z7984 Long term (current) use of oral hypoglycemic drugs: Secondary | ICD-10-CM | POA: Diagnosis not present

## 2017-07-31 DIAGNOSIS — E039 Hypothyroidism, unspecified: Secondary | ICD-10-CM | POA: Diagnosis not present

## 2017-07-31 DIAGNOSIS — I110 Hypertensive heart disease with heart failure: Secondary | ICD-10-CM | POA: Diagnosis not present

## 2017-07-31 DIAGNOSIS — E119 Type 2 diabetes mellitus without complications: Secondary | ICD-10-CM | POA: Diagnosis not present

## 2017-07-31 DIAGNOSIS — Z9181 History of falling: Secondary | ICD-10-CM | POA: Diagnosis not present

## 2017-07-31 DIAGNOSIS — I5031 Acute diastolic (congestive) heart failure: Secondary | ICD-10-CM | POA: Diagnosis not present

## 2017-07-31 DIAGNOSIS — R2689 Other abnormalities of gait and mobility: Secondary | ICD-10-CM | POA: Diagnosis not present

## 2017-07-31 DIAGNOSIS — E785 Hyperlipidemia, unspecified: Secondary | ICD-10-CM | POA: Diagnosis not present

## 2017-07-31 DIAGNOSIS — Z86718 Personal history of other venous thrombosis and embolism: Secondary | ICD-10-CM | POA: Diagnosis not present

## 2017-07-31 DIAGNOSIS — I5023 Acute on chronic systolic (congestive) heart failure: Secondary | ICD-10-CM | POA: Diagnosis not present

## 2017-08-01 DIAGNOSIS — I110 Hypertensive heart disease with heart failure: Secondary | ICD-10-CM | POA: Diagnosis not present

## 2017-08-01 DIAGNOSIS — E039 Hypothyroidism, unspecified: Secondary | ICD-10-CM | POA: Diagnosis not present

## 2017-08-01 DIAGNOSIS — Z7984 Long term (current) use of oral hypoglycemic drugs: Secondary | ICD-10-CM | POA: Diagnosis not present

## 2017-08-01 DIAGNOSIS — Z86718 Personal history of other venous thrombosis and embolism: Secondary | ICD-10-CM | POA: Diagnosis not present

## 2017-08-01 DIAGNOSIS — I5023 Acute on chronic systolic (congestive) heart failure: Secondary | ICD-10-CM | POA: Diagnosis not present

## 2017-08-01 DIAGNOSIS — E119 Type 2 diabetes mellitus without complications: Secondary | ICD-10-CM | POA: Diagnosis not present

## 2017-08-01 DIAGNOSIS — R2689 Other abnormalities of gait and mobility: Secondary | ICD-10-CM | POA: Diagnosis not present

## 2017-08-01 DIAGNOSIS — Z9181 History of falling: Secondary | ICD-10-CM | POA: Diagnosis not present

## 2017-08-01 DIAGNOSIS — E785 Hyperlipidemia, unspecified: Secondary | ICD-10-CM | POA: Diagnosis not present

## 2017-08-01 DIAGNOSIS — I5031 Acute diastolic (congestive) heart failure: Secondary | ICD-10-CM | POA: Diagnosis not present

## 2017-08-05 DIAGNOSIS — E039 Hypothyroidism, unspecified: Secondary | ICD-10-CM | POA: Diagnosis not present

## 2017-08-05 DIAGNOSIS — Z7984 Long term (current) use of oral hypoglycemic drugs: Secondary | ICD-10-CM | POA: Diagnosis not present

## 2017-08-05 DIAGNOSIS — I5031 Acute diastolic (congestive) heart failure: Secondary | ICD-10-CM | POA: Diagnosis not present

## 2017-08-05 DIAGNOSIS — Z86718 Personal history of other venous thrombosis and embolism: Secondary | ICD-10-CM | POA: Diagnosis not present

## 2017-08-05 DIAGNOSIS — I110 Hypertensive heart disease with heart failure: Secondary | ICD-10-CM | POA: Diagnosis not present

## 2017-08-05 DIAGNOSIS — R2689 Other abnormalities of gait and mobility: Secondary | ICD-10-CM | POA: Diagnosis not present

## 2017-08-05 DIAGNOSIS — E119 Type 2 diabetes mellitus without complications: Secondary | ICD-10-CM | POA: Diagnosis not present

## 2017-08-05 DIAGNOSIS — Z9181 History of falling: Secondary | ICD-10-CM | POA: Diagnosis not present

## 2017-08-05 DIAGNOSIS — E785 Hyperlipidemia, unspecified: Secondary | ICD-10-CM | POA: Diagnosis not present

## 2017-08-05 DIAGNOSIS — I5023 Acute on chronic systolic (congestive) heart failure: Secondary | ICD-10-CM | POA: Diagnosis not present

## 2017-08-07 DIAGNOSIS — I5023 Acute on chronic systolic (congestive) heart failure: Secondary | ICD-10-CM | POA: Diagnosis not present

## 2017-08-07 DIAGNOSIS — E039 Hypothyroidism, unspecified: Secondary | ICD-10-CM | POA: Diagnosis not present

## 2017-08-07 DIAGNOSIS — Z7984 Long term (current) use of oral hypoglycemic drugs: Secondary | ICD-10-CM | POA: Diagnosis not present

## 2017-08-07 DIAGNOSIS — E119 Type 2 diabetes mellitus without complications: Secondary | ICD-10-CM | POA: Diagnosis not present

## 2017-08-07 DIAGNOSIS — R2689 Other abnormalities of gait and mobility: Secondary | ICD-10-CM | POA: Diagnosis not present

## 2017-08-07 DIAGNOSIS — Z9181 History of falling: Secondary | ICD-10-CM | POA: Diagnosis not present

## 2017-08-07 DIAGNOSIS — Z86718 Personal history of other venous thrombosis and embolism: Secondary | ICD-10-CM | POA: Diagnosis not present

## 2017-08-07 DIAGNOSIS — I5031 Acute diastolic (congestive) heart failure: Secondary | ICD-10-CM | POA: Diagnosis not present

## 2017-08-07 DIAGNOSIS — I110 Hypertensive heart disease with heart failure: Secondary | ICD-10-CM | POA: Diagnosis not present

## 2017-08-07 DIAGNOSIS — E785 Hyperlipidemia, unspecified: Secondary | ICD-10-CM | POA: Diagnosis not present

## 2017-08-08 DIAGNOSIS — Z9181 History of falling: Secondary | ICD-10-CM | POA: Diagnosis not present

## 2017-08-08 DIAGNOSIS — E785 Hyperlipidemia, unspecified: Secondary | ICD-10-CM | POA: Diagnosis not present

## 2017-08-08 DIAGNOSIS — Z7984 Long term (current) use of oral hypoglycemic drugs: Secondary | ICD-10-CM | POA: Diagnosis not present

## 2017-08-08 DIAGNOSIS — I5023 Acute on chronic systolic (congestive) heart failure: Secondary | ICD-10-CM | POA: Diagnosis not present

## 2017-08-08 DIAGNOSIS — I5031 Acute diastolic (congestive) heart failure: Secondary | ICD-10-CM | POA: Diagnosis not present

## 2017-08-08 DIAGNOSIS — Z86718 Personal history of other venous thrombosis and embolism: Secondary | ICD-10-CM | POA: Diagnosis not present

## 2017-08-08 DIAGNOSIS — R2689 Other abnormalities of gait and mobility: Secondary | ICD-10-CM | POA: Diagnosis not present

## 2017-08-08 DIAGNOSIS — E039 Hypothyroidism, unspecified: Secondary | ICD-10-CM | POA: Diagnosis not present

## 2017-08-08 DIAGNOSIS — I110 Hypertensive heart disease with heart failure: Secondary | ICD-10-CM | POA: Diagnosis not present

## 2017-08-08 DIAGNOSIS — E119 Type 2 diabetes mellitus without complications: Secondary | ICD-10-CM | POA: Diagnosis not present

## 2017-08-09 ENCOUNTER — Other Ambulatory Visit: Payer: Self-pay

## 2017-08-09 DIAGNOSIS — E119 Type 2 diabetes mellitus without complications: Secondary | ICD-10-CM | POA: Diagnosis not present

## 2017-08-09 DIAGNOSIS — G629 Polyneuropathy, unspecified: Secondary | ICD-10-CM

## 2017-08-09 DIAGNOSIS — E785 Hyperlipidemia, unspecified: Secondary | ICD-10-CM | POA: Diagnosis not present

## 2017-08-09 DIAGNOSIS — Z7984 Long term (current) use of oral hypoglycemic drugs: Secondary | ICD-10-CM | POA: Diagnosis not present

## 2017-08-09 DIAGNOSIS — I5023 Acute on chronic systolic (congestive) heart failure: Secondary | ICD-10-CM | POA: Diagnosis not present

## 2017-08-09 DIAGNOSIS — F419 Anxiety disorder, unspecified: Secondary | ICD-10-CM

## 2017-08-09 DIAGNOSIS — I110 Hypertensive heart disease with heart failure: Secondary | ICD-10-CM | POA: Diagnosis not present

## 2017-08-09 DIAGNOSIS — Z9181 History of falling: Secondary | ICD-10-CM | POA: Diagnosis not present

## 2017-08-09 DIAGNOSIS — I5031 Acute diastolic (congestive) heart failure: Secondary | ICD-10-CM | POA: Diagnosis not present

## 2017-08-09 DIAGNOSIS — R2689 Other abnormalities of gait and mobility: Secondary | ICD-10-CM | POA: Diagnosis not present

## 2017-08-09 DIAGNOSIS — E039 Hypothyroidism, unspecified: Secondary | ICD-10-CM | POA: Diagnosis not present

## 2017-08-09 DIAGNOSIS — Z86718 Personal history of other venous thrombosis and embolism: Secondary | ICD-10-CM | POA: Diagnosis not present

## 2017-08-11 DIAGNOSIS — E039 Hypothyroidism, unspecified: Secondary | ICD-10-CM | POA: Diagnosis not present

## 2017-08-11 DIAGNOSIS — I5031 Acute diastolic (congestive) heart failure: Secondary | ICD-10-CM | POA: Diagnosis not present

## 2017-08-11 DIAGNOSIS — Z86718 Personal history of other venous thrombosis and embolism: Secondary | ICD-10-CM | POA: Diagnosis not present

## 2017-08-11 DIAGNOSIS — I5023 Acute on chronic systolic (congestive) heart failure: Secondary | ICD-10-CM | POA: Diagnosis not present

## 2017-08-11 DIAGNOSIS — Z7984 Long term (current) use of oral hypoglycemic drugs: Secondary | ICD-10-CM | POA: Diagnosis not present

## 2017-08-11 DIAGNOSIS — E119 Type 2 diabetes mellitus without complications: Secondary | ICD-10-CM | POA: Diagnosis not present

## 2017-08-11 DIAGNOSIS — E785 Hyperlipidemia, unspecified: Secondary | ICD-10-CM | POA: Diagnosis not present

## 2017-08-11 DIAGNOSIS — I110 Hypertensive heart disease with heart failure: Secondary | ICD-10-CM | POA: Diagnosis not present

## 2017-08-11 DIAGNOSIS — R2689 Other abnormalities of gait and mobility: Secondary | ICD-10-CM | POA: Diagnosis not present

## 2017-08-11 DIAGNOSIS — Z9181 History of falling: Secondary | ICD-10-CM | POA: Diagnosis not present

## 2017-08-12 DIAGNOSIS — E119 Type 2 diabetes mellitus without complications: Secondary | ICD-10-CM | POA: Diagnosis not present

## 2017-08-12 DIAGNOSIS — E785 Hyperlipidemia, unspecified: Secondary | ICD-10-CM | POA: Diagnosis not present

## 2017-08-12 DIAGNOSIS — I5031 Acute diastolic (congestive) heart failure: Secondary | ICD-10-CM | POA: Diagnosis not present

## 2017-08-12 DIAGNOSIS — R2689 Other abnormalities of gait and mobility: Secondary | ICD-10-CM | POA: Diagnosis not present

## 2017-08-12 DIAGNOSIS — E039 Hypothyroidism, unspecified: Secondary | ICD-10-CM | POA: Diagnosis not present

## 2017-08-12 DIAGNOSIS — Z86718 Personal history of other venous thrombosis and embolism: Secondary | ICD-10-CM | POA: Diagnosis not present

## 2017-08-12 DIAGNOSIS — I5023 Acute on chronic systolic (congestive) heart failure: Secondary | ICD-10-CM | POA: Diagnosis not present

## 2017-08-12 DIAGNOSIS — I110 Hypertensive heart disease with heart failure: Secondary | ICD-10-CM | POA: Diagnosis not present

## 2017-08-12 DIAGNOSIS — Z9181 History of falling: Secondary | ICD-10-CM | POA: Diagnosis not present

## 2017-08-12 DIAGNOSIS — Z7984 Long term (current) use of oral hypoglycemic drugs: Secondary | ICD-10-CM | POA: Diagnosis not present

## 2017-08-14 ENCOUNTER — Encounter: Payer: Self-pay | Admitting: Family Medicine

## 2017-08-14 ENCOUNTER — Ambulatory Visit (INDEPENDENT_AMBULATORY_CARE_PROVIDER_SITE_OTHER): Payer: Medicare Other | Admitting: Family Medicine

## 2017-08-14 VITALS — BP 112/70 | HR 60

## 2017-08-14 DIAGNOSIS — I5023 Acute on chronic systolic (congestive) heart failure: Secondary | ICD-10-CM | POA: Diagnosis not present

## 2017-08-14 DIAGNOSIS — I82443 Acute embolism and thrombosis of tibial vein, bilateral: Secondary | ICD-10-CM

## 2017-08-14 DIAGNOSIS — L97529 Non-pressure chronic ulcer of other part of left foot with unspecified severity: Secondary | ICD-10-CM

## 2017-08-14 DIAGNOSIS — E11621 Type 2 diabetes mellitus with foot ulcer: Secondary | ICD-10-CM | POA: Diagnosis not present

## 2017-08-14 DIAGNOSIS — L97509 Non-pressure chronic ulcer of other part of unspecified foot with unspecified severity: Secondary | ICD-10-CM | POA: Diagnosis not present

## 2017-08-14 DIAGNOSIS — I5022 Chronic systolic (congestive) heart failure: Secondary | ICD-10-CM | POA: Diagnosis not present

## 2017-08-14 DIAGNOSIS — E119 Type 2 diabetes mellitus without complications: Secondary | ICD-10-CM | POA: Diagnosis not present

## 2017-08-14 DIAGNOSIS — R2689 Other abnormalities of gait and mobility: Secondary | ICD-10-CM | POA: Diagnosis not present

## 2017-08-14 DIAGNOSIS — I739 Peripheral vascular disease, unspecified: Secondary | ICD-10-CM

## 2017-08-14 DIAGNOSIS — E039 Hypothyroidism, unspecified: Secondary | ICD-10-CM | POA: Diagnosis not present

## 2017-08-14 DIAGNOSIS — I5031 Acute diastolic (congestive) heart failure: Secondary | ICD-10-CM | POA: Diagnosis not present

## 2017-08-14 DIAGNOSIS — I10 Essential (primary) hypertension: Secondary | ICD-10-CM

## 2017-08-14 DIAGNOSIS — Z7984 Long term (current) use of oral hypoglycemic drugs: Secondary | ICD-10-CM | POA: Diagnosis not present

## 2017-08-14 DIAGNOSIS — E785 Hyperlipidemia, unspecified: Secondary | ICD-10-CM | POA: Diagnosis not present

## 2017-08-14 DIAGNOSIS — I110 Hypertensive heart disease with heart failure: Secondary | ICD-10-CM | POA: Diagnosis not present

## 2017-08-14 DIAGNOSIS — Z9181 History of falling: Secondary | ICD-10-CM | POA: Diagnosis not present

## 2017-08-14 DIAGNOSIS — Z86718 Personal history of other venous thrombosis and embolism: Secondary | ICD-10-CM | POA: Diagnosis not present

## 2017-08-14 NOTE — Progress Notes (Signed)
Name: Lorraine Fischer   MRN: [id number]    DOB: Feb 07, 1923   Date:08/14/2017       Progress Note  Subjective  Chief Complaint  Chief Complaint  Patient presents with  . Follow-up    wound on end/ top of L) great toe. New place on top of joint of second toe on same foot- may be where it is rubbing against shoe    Foot Injury   The incident occurred more than 1 week ago (foot ulcer). There was no injury mechanism. The pain is present in the left toes and left foot. The quality of the pain is described as aching. The pain is at a severity of 5/10. The pain is moderate. The pain has been fluctuating since onset. Pertinent negatives include no inability to bear weight, loss of motion, loss of sensation, muscle weakness, numbness or tingling. She reports no foreign bodies present. Nothing aggravates the symptoms. She has tried nothing (asa) for the symptoms. The treatment provided no relief.    No problem-specific Assessment & Plan notes found for this encounter.   Past Medical History:  Diagnosis Date  . Diabetes mellitus without complication ([place])   . DVT (deep venous thrombosis) ([place])   . Hyperlipidemia   . Hypertension   . Thyroid disease     Past Surgical History:  Procedure Laterality Date  . ANKLE SURGERY    . HIP FRACTURE SURGERY    . WRIST SURGERY Right     Family History  Problem Relation Age of Onset  . Hypertension Mother     Social History   Socioeconomic History  . Marital status: Widowed    Spouse name: Not on file  . Number of children: Not on file  . Years of education: Not on file  . Highest education level: Not on file  Social Needs  . Financial resource strain: Not on file  . Food insecurity - worry: Not on file  . Food insecurity - inability: Not on file  . Transportation needs - medical: Not on file  . Transportation needs - non-medical: Not on file  Occupational History  . Not on file  Tobacco Use  . Smoking status: Never Smoker  . Smokeless  tobacco: Never Used  Substance and Sexual Activity  . Alcohol use: No  . Drug use: No  . Sexual activity: Not Currently  Other Topics Concern  . Not on file  Social History Narrative  . Not on file    Allergies  Allergen Reactions  . Sulfa Antibiotics Shortness Of Breath  . Sulfasalazine Shortness Of Breath    Outpatient Medications Prior to Visit  Medication Sig Dispense Refill  . aspirin 81 MG chewable tablet Chew 81 mg by mouth daily.    Marland Kitchen docusate sodium (COLACE) 100 MG capsule Take 1 capsule (100 mg total) by mouth 2 (two) times daily as needed for mild constipation. 10 capsule 0  . furosemide (LASIX) 40 MG tablet TAKE 1 TABLET BY MOUTH ONCE DAILY 90 tablet 0  . levothyroxine (SYNTHROID, LEVOTHROID) 50 MCG tablet Take 1 tablet (50 mcg total) by mouth daily before breakfast. 90 tablet 2  . lisinopril (PRINIVIL,ZESTRIL) 10 MG tablet Take 1 tablet (10 mg total) by mouth daily. 30 tablet 0  . metFORMIN (GLUCOPHAGE) 500 MG tablet TAKE ONE TABLET BY MOUTH ONCE DAILY WITH BREAKFAT 90 tablet 2  . metoprolol succinate (TOPROL-XL) 50 MG 24 hr tablet Take 1 tablet (50 mg total) by mouth daily. Take with or immediately  following a meal. 90 tablet 2  . spironolactone (ALDACTONE) 25 MG tablet Take 0.5 tablets (12.5 mg total) by mouth daily. 30 tablet 0  . furosemide (LASIX) 20 MG tablet Take 1 tablet (20 mg total) by mouth daily. 30 tablet 3  . lovastatin (MEVACOR) 40 MG tablet Take 1 tablet (40 mg total) by mouth daily. 90 tablet 2   No facility-administered medications prior to visit.     Review of Systems  Constitutional: Negative for chills, fever, malaise/fatigue and weight loss.  HENT: Negative for ear discharge, ear pain and sore throat.   Eyes: Negative for blurred vision.  Respiratory: Negative for cough, sputum production, shortness of breath and wheezing.   Cardiovascular: Negative for chest pain, palpitations and leg swelling.  Gastrointestinal: Negative for abdominal pain,  blood in stool, constipation, diarrhea, heartburn, melena and nausea.  Genitourinary: Negative for dysuria, frequency, hematuria and urgency.  Musculoskeletal: Negative for back pain, joint pain, myalgias and neck pain.  Skin: Negative for rash.  Neurological: Negative for dizziness, tingling, sensory change, focal weakness, numbness and headaches.  Endo/Heme/Allergies: Negative for environmental allergies and polydipsia. Does not bruise/bleed easily.  Psychiatric/Behavioral: Negative for depression and suicidal ideas. The patient is not nervous/anxious and does not have insomnia.      Objective  Vitals:   08/14/17 1034  BP: 112/70  Pulse: 60    Physical Exam  Constitutional: She is well-developed, well-nourished, and in no distress. No distress.  HENT:  Head: Normocephalic and atraumatic.  Right Ear: External ear normal.  Left Ear: External ear normal.  Nose: Nose normal.  Mouth/Throat: Oropharynx is clear and moist.  Eyes: Conjunctivae and EOM are normal. Pupils are equal, round, and reactive to light. Right eye exhibits no discharge. Left eye exhibits no discharge.  Neck: Normal range of motion. Neck supple. No JVD present. No thyromegaly present.  Cardiovascular: Normal rate, regular rhythm, S1 normal, S2 normal, normal heart sounds and intact distal pulses. Exam reveals no gallop and no friction rub.  No murmur heard. Pulmonary/Chest: Effort normal and breath sounds normal. She has no wheezes. She has no rales.  Abdominal: Soft. Bowel sounds are normal. She exhibits no mass. There is no tenderness. There is no guarding.  Musculoskeletal: Normal range of motion. She exhibits no edema.  Lymphadenopathy:    She has no cervical adenopathy.  Neurological: She is alert. She has normal reflexes.  Skin: Skin is warm and dry. She is not diaphoretic.  Psychiatric: Mood and affect normal.  Nursing note and vitals reviewed.     Assessment & Plan  Problem List Items Addressed  This Visit      Cardiovascular and Mediastinum   Essential hypertension   DVT (deep venous thrombosis) (HCC)   Chronic systolic heart failure (HCC) (Chronic)     Endocrine   Diabetes ([place]) - Primary (Chronic)   Relevant Orders   Ambulatory referral to Podiatry    Other Visit Diagnoses    PVD (peripheral vascular disease) ([place])       Diabetic ulcer of toe of left foot associated with type 2 diabetes mellitus, unspecified ulcer stage ([place])       Relevant Orders   Ambulatory referral to Podiatry      No orders of the defined types were placed in this encounter.     Dr. [facility]  08/14/17

## 2017-08-15 DIAGNOSIS — E785 Hyperlipidemia, unspecified: Secondary | ICD-10-CM | POA: Diagnosis not present

## 2017-08-15 DIAGNOSIS — E119 Type 2 diabetes mellitus without complications: Secondary | ICD-10-CM | POA: Diagnosis not present

## 2017-08-15 DIAGNOSIS — I5031 Acute diastolic (congestive) heart failure: Secondary | ICD-10-CM | POA: Diagnosis not present

## 2017-08-15 DIAGNOSIS — R2689 Other abnormalities of gait and mobility: Secondary | ICD-10-CM | POA: Diagnosis not present

## 2017-08-15 DIAGNOSIS — I110 Hypertensive heart disease with heart failure: Secondary | ICD-10-CM | POA: Diagnosis not present

## 2017-08-15 DIAGNOSIS — E039 Hypothyroidism, unspecified: Secondary | ICD-10-CM | POA: Diagnosis not present

## 2017-08-15 DIAGNOSIS — Z9181 History of falling: Secondary | ICD-10-CM | POA: Diagnosis not present

## 2017-08-15 DIAGNOSIS — Z7984 Long term (current) use of oral hypoglycemic drugs: Secondary | ICD-10-CM | POA: Diagnosis not present

## 2017-08-15 DIAGNOSIS — Z86718 Personal history of other venous thrombosis and embolism: Secondary | ICD-10-CM | POA: Diagnosis not present

## 2017-08-15 DIAGNOSIS — I5023 Acute on chronic systolic (congestive) heart failure: Secondary | ICD-10-CM | POA: Diagnosis not present

## 2017-08-16 DIAGNOSIS — Z9181 History of falling: Secondary | ICD-10-CM | POA: Diagnosis not present

## 2017-08-16 DIAGNOSIS — I5023 Acute on chronic systolic (congestive) heart failure: Secondary | ICD-10-CM | POA: Diagnosis not present

## 2017-08-16 DIAGNOSIS — R2689 Other abnormalities of gait and mobility: Secondary | ICD-10-CM | POA: Diagnosis not present

## 2017-08-16 DIAGNOSIS — E039 Hypothyroidism, unspecified: Secondary | ICD-10-CM | POA: Diagnosis not present

## 2017-08-16 DIAGNOSIS — E785 Hyperlipidemia, unspecified: Secondary | ICD-10-CM | POA: Diagnosis not present

## 2017-08-16 DIAGNOSIS — I110 Hypertensive heart disease with heart failure: Secondary | ICD-10-CM | POA: Diagnosis not present

## 2017-08-16 DIAGNOSIS — E119 Type 2 diabetes mellitus without complications: Secondary | ICD-10-CM | POA: Diagnosis not present

## 2017-08-16 DIAGNOSIS — Z86718 Personal history of other venous thrombosis and embolism: Secondary | ICD-10-CM | POA: Diagnosis not present

## 2017-08-16 DIAGNOSIS — Z7984 Long term (current) use of oral hypoglycemic drugs: Secondary | ICD-10-CM | POA: Diagnosis not present

## 2017-08-16 DIAGNOSIS — I5031 Acute diastolic (congestive) heart failure: Secondary | ICD-10-CM | POA: Diagnosis not present

## 2017-08-19 DIAGNOSIS — R2689 Other abnormalities of gait and mobility: Secondary | ICD-10-CM | POA: Diagnosis not present

## 2017-08-19 DIAGNOSIS — I110 Hypertensive heart disease with heart failure: Secondary | ICD-10-CM | POA: Diagnosis not present

## 2017-08-19 DIAGNOSIS — E119 Type 2 diabetes mellitus without complications: Secondary | ICD-10-CM | POA: Diagnosis not present

## 2017-08-19 DIAGNOSIS — Z7984 Long term (current) use of oral hypoglycemic drugs: Secondary | ICD-10-CM | POA: Diagnosis not present

## 2017-08-19 DIAGNOSIS — E039 Hypothyroidism, unspecified: Secondary | ICD-10-CM | POA: Diagnosis not present

## 2017-08-19 DIAGNOSIS — I5031 Acute diastolic (congestive) heart failure: Secondary | ICD-10-CM | POA: Diagnosis not present

## 2017-08-19 DIAGNOSIS — Z9181 History of falling: Secondary | ICD-10-CM | POA: Diagnosis not present

## 2017-08-19 DIAGNOSIS — Z86718 Personal history of other venous thrombosis and embolism: Secondary | ICD-10-CM | POA: Diagnosis not present

## 2017-08-19 DIAGNOSIS — I5023 Acute on chronic systolic (congestive) heart failure: Secondary | ICD-10-CM | POA: Diagnosis not present

## 2017-08-19 DIAGNOSIS — E785 Hyperlipidemia, unspecified: Secondary | ICD-10-CM | POA: Diagnosis not present

## 2017-08-21 DIAGNOSIS — Z86718 Personal history of other venous thrombosis and embolism: Secondary | ICD-10-CM | POA: Diagnosis not present

## 2017-08-21 DIAGNOSIS — R2689 Other abnormalities of gait and mobility: Secondary | ICD-10-CM | POA: Diagnosis not present

## 2017-08-21 DIAGNOSIS — E785 Hyperlipidemia, unspecified: Secondary | ICD-10-CM | POA: Diagnosis not present

## 2017-08-21 DIAGNOSIS — Z9181 History of falling: Secondary | ICD-10-CM | POA: Diagnosis not present

## 2017-08-21 DIAGNOSIS — Z7984 Long term (current) use of oral hypoglycemic drugs: Secondary | ICD-10-CM | POA: Diagnosis not present

## 2017-08-21 DIAGNOSIS — I5023 Acute on chronic systolic (congestive) heart failure: Secondary | ICD-10-CM | POA: Diagnosis not present

## 2017-08-21 DIAGNOSIS — I110 Hypertensive heart disease with heart failure: Secondary | ICD-10-CM | POA: Diagnosis not present

## 2017-08-21 DIAGNOSIS — I5031 Acute diastolic (congestive) heart failure: Secondary | ICD-10-CM | POA: Diagnosis not present

## 2017-08-21 DIAGNOSIS — E119 Type 2 diabetes mellitus without complications: Secondary | ICD-10-CM | POA: Diagnosis not present

## 2017-08-21 DIAGNOSIS — E039 Hypothyroidism, unspecified: Secondary | ICD-10-CM | POA: Diagnosis not present

## 2017-08-22 DIAGNOSIS — I110 Hypertensive heart disease with heart failure: Secondary | ICD-10-CM | POA: Diagnosis not present

## 2017-08-22 DIAGNOSIS — I5031 Acute diastolic (congestive) heart failure: Secondary | ICD-10-CM | POA: Diagnosis not present

## 2017-08-22 DIAGNOSIS — E785 Hyperlipidemia, unspecified: Secondary | ICD-10-CM | POA: Diagnosis not present

## 2017-08-22 DIAGNOSIS — Z7984 Long term (current) use of oral hypoglycemic drugs: Secondary | ICD-10-CM | POA: Diagnosis not present

## 2017-08-22 DIAGNOSIS — E039 Hypothyroidism, unspecified: Secondary | ICD-10-CM | POA: Diagnosis not present

## 2017-08-22 DIAGNOSIS — E119 Type 2 diabetes mellitus without complications: Secondary | ICD-10-CM | POA: Diagnosis not present

## 2017-08-22 DIAGNOSIS — I5023 Acute on chronic systolic (congestive) heart failure: Secondary | ICD-10-CM | POA: Diagnosis not present

## 2017-08-22 DIAGNOSIS — R2689 Other abnormalities of gait and mobility: Secondary | ICD-10-CM | POA: Diagnosis not present

## 2017-08-22 DIAGNOSIS — Z9181 History of falling: Secondary | ICD-10-CM | POA: Diagnosis not present

## 2017-08-22 DIAGNOSIS — Z86718 Personal history of other venous thrombosis and embolism: Secondary | ICD-10-CM | POA: Diagnosis not present

## 2017-08-23 DIAGNOSIS — Z86718 Personal history of other venous thrombosis and embolism: Secondary | ICD-10-CM | POA: Diagnosis not present

## 2017-08-23 DIAGNOSIS — E119 Type 2 diabetes mellitus without complications: Secondary | ICD-10-CM | POA: Diagnosis not present

## 2017-08-23 DIAGNOSIS — I5023 Acute on chronic systolic (congestive) heart failure: Secondary | ICD-10-CM | POA: Diagnosis not present

## 2017-08-23 DIAGNOSIS — Z7984 Long term (current) use of oral hypoglycemic drugs: Secondary | ICD-10-CM | POA: Diagnosis not present

## 2017-08-23 DIAGNOSIS — I5031 Acute diastolic (congestive) heart failure: Secondary | ICD-10-CM | POA: Diagnosis not present

## 2017-08-23 DIAGNOSIS — I110 Hypertensive heart disease with heart failure: Secondary | ICD-10-CM | POA: Diagnosis not present

## 2017-08-23 DIAGNOSIS — E039 Hypothyroidism, unspecified: Secondary | ICD-10-CM | POA: Diagnosis not present

## 2017-08-23 DIAGNOSIS — R2689 Other abnormalities of gait and mobility: Secondary | ICD-10-CM | POA: Diagnosis not present

## 2017-08-23 DIAGNOSIS — Z9181 History of falling: Secondary | ICD-10-CM | POA: Diagnosis not present

## 2017-08-23 DIAGNOSIS — E785 Hyperlipidemia, unspecified: Secondary | ICD-10-CM | POA: Diagnosis not present

## 2017-08-24 DIAGNOSIS — L97521 Non-pressure chronic ulcer of other part of left foot limited to breakdown of skin: Secondary | ICD-10-CM | POA: Diagnosis not present

## 2017-08-24 DIAGNOSIS — E11621 Type 2 diabetes mellitus with foot ulcer: Secondary | ICD-10-CM | POA: Diagnosis not present

## 2017-08-24 DIAGNOSIS — E039 Hypothyroidism, unspecified: Secondary | ICD-10-CM | POA: Diagnosis not present

## 2017-08-24 DIAGNOSIS — I5023 Acute on chronic systolic (congestive) heart failure: Secondary | ICD-10-CM | POA: Diagnosis not present

## 2017-08-24 DIAGNOSIS — Z7984 Long term (current) use of oral hypoglycemic drugs: Secondary | ICD-10-CM | POA: Diagnosis not present

## 2017-08-24 DIAGNOSIS — Z86718 Personal history of other venous thrombosis and embolism: Secondary | ICD-10-CM | POA: Diagnosis not present

## 2017-08-24 DIAGNOSIS — Z9181 History of falling: Secondary | ICD-10-CM | POA: Diagnosis not present

## 2017-08-24 DIAGNOSIS — I5031 Acute diastolic (congestive) heart failure: Secondary | ICD-10-CM | POA: Diagnosis not present

## 2017-08-24 DIAGNOSIS — E785 Hyperlipidemia, unspecified: Secondary | ICD-10-CM | POA: Diagnosis not present

## 2017-08-24 DIAGNOSIS — I110 Hypertensive heart disease with heart failure: Secondary | ICD-10-CM | POA: Diagnosis not present

## 2017-08-27 DIAGNOSIS — E785 Hyperlipidemia, unspecified: Secondary | ICD-10-CM | POA: Diagnosis not present

## 2017-08-27 DIAGNOSIS — Z7984 Long term (current) use of oral hypoglycemic drugs: Secondary | ICD-10-CM | POA: Diagnosis not present

## 2017-08-27 DIAGNOSIS — L97521 Non-pressure chronic ulcer of other part of left foot limited to breakdown of skin: Secondary | ICD-10-CM | POA: Diagnosis not present

## 2017-08-27 DIAGNOSIS — E039 Hypothyroidism, unspecified: Secondary | ICD-10-CM | POA: Diagnosis not present

## 2017-08-27 DIAGNOSIS — I5023 Acute on chronic systolic (congestive) heart failure: Secondary | ICD-10-CM | POA: Diagnosis not present

## 2017-08-27 DIAGNOSIS — I5031 Acute diastolic (congestive) heart failure: Secondary | ICD-10-CM | POA: Diagnosis not present

## 2017-08-27 DIAGNOSIS — E11621 Type 2 diabetes mellitus with foot ulcer: Secondary | ICD-10-CM | POA: Diagnosis not present

## 2017-08-27 DIAGNOSIS — Z86718 Personal history of other venous thrombosis and embolism: Secondary | ICD-10-CM | POA: Diagnosis not present

## 2017-08-27 DIAGNOSIS — Z9181 History of falling: Secondary | ICD-10-CM | POA: Diagnosis not present

## 2017-08-27 DIAGNOSIS — I110 Hypertensive heart disease with heart failure: Secondary | ICD-10-CM | POA: Diagnosis not present

## 2017-08-28 DIAGNOSIS — Z9181 History of falling: Secondary | ICD-10-CM | POA: Diagnosis not present

## 2017-08-28 DIAGNOSIS — Z7984 Long term (current) use of oral hypoglycemic drugs: Secondary | ICD-10-CM | POA: Diagnosis not present

## 2017-08-28 DIAGNOSIS — I5031 Acute diastolic (congestive) heart failure: Secondary | ICD-10-CM | POA: Diagnosis not present

## 2017-08-28 DIAGNOSIS — E785 Hyperlipidemia, unspecified: Secondary | ICD-10-CM | POA: Diagnosis not present

## 2017-08-28 DIAGNOSIS — E039 Hypothyroidism, unspecified: Secondary | ICD-10-CM | POA: Diagnosis not present

## 2017-08-28 DIAGNOSIS — I5023 Acute on chronic systolic (congestive) heart failure: Secondary | ICD-10-CM | POA: Diagnosis not present

## 2017-08-28 DIAGNOSIS — Z86718 Personal history of other venous thrombosis and embolism: Secondary | ICD-10-CM | POA: Diagnosis not present

## 2017-08-28 DIAGNOSIS — I110 Hypertensive heart disease with heart failure: Secondary | ICD-10-CM | POA: Diagnosis not present

## 2017-08-28 DIAGNOSIS — L97521 Non-pressure chronic ulcer of other part of left foot limited to breakdown of skin: Secondary | ICD-10-CM | POA: Diagnosis not present

## 2017-08-28 DIAGNOSIS — E11621 Type 2 diabetes mellitus with foot ulcer: Secondary | ICD-10-CM | POA: Diagnosis not present

## 2017-08-29 ENCOUNTER — Telehealth: Payer: Self-pay

## 2017-08-29 DIAGNOSIS — Z7984 Long term (current) use of oral hypoglycemic drugs: Secondary | ICD-10-CM | POA: Diagnosis not present

## 2017-08-29 DIAGNOSIS — E785 Hyperlipidemia, unspecified: Secondary | ICD-10-CM | POA: Diagnosis not present

## 2017-08-29 DIAGNOSIS — I5023 Acute on chronic systolic (congestive) heart failure: Secondary | ICD-10-CM | POA: Diagnosis not present

## 2017-08-29 DIAGNOSIS — L97521 Non-pressure chronic ulcer of other part of left foot limited to breakdown of skin: Secondary | ICD-10-CM | POA: Diagnosis not present

## 2017-08-29 DIAGNOSIS — Z9181 History of falling: Secondary | ICD-10-CM | POA: Diagnosis not present

## 2017-08-29 DIAGNOSIS — Z86718 Personal history of other venous thrombosis and embolism: Secondary | ICD-10-CM | POA: Diagnosis not present

## 2017-08-29 DIAGNOSIS — I5031 Acute diastolic (congestive) heart failure: Secondary | ICD-10-CM | POA: Diagnosis not present

## 2017-08-29 DIAGNOSIS — I110 Hypertensive heart disease with heart failure: Secondary | ICD-10-CM | POA: Diagnosis not present

## 2017-08-29 DIAGNOSIS — I5022 Chronic systolic (congestive) heart failure: Secondary | ICD-10-CM | POA: Diagnosis not present

## 2017-08-29 DIAGNOSIS — E11621 Type 2 diabetes mellitus with foot ulcer: Secondary | ICD-10-CM | POA: Diagnosis not present

## 2017-08-29 DIAGNOSIS — E039 Hypothyroidism, unspecified: Secondary | ICD-10-CM | POA: Diagnosis not present

## 2017-08-29 MED ORDER — RISPERIDONE 0.25 MG PO TABS: 0.2500 mg | ORAL_TABLET | Freq: Every day | ORAL | 0 refills | Status: AC

## 2017-08-29 MED ORDER — SERTRALINE HCL 25 MG PO TABS: 25.0000 mg | ORAL_TABLET | Freq: Every day | ORAL | 0 refills | Status: AC

## 2017-08-29 MED ORDER — GABAPENTIN 100 MG PO CAPS: 100.0000 mg | ORAL_CAPSULE | Freq: Every day | ORAL | 0 refills | Status: AC

## 2017-08-29 NOTE — Telephone Encounter (Signed)
Jonni Sanger (son) and Magda Paganini from inhome palliative care called wanting pt to be put on something for "aggressive behavior toward family" also "gabapentin for neuropathy". We called Magda Paganini back and Dr Ronnald Ramp agreed to prescribe, gabapentin 100mg  1 qhs, risperdol 0.25mg - take 1 tab qday for a week then 1 BID after if tolerated. Also Sertraline 25mg - take 1/2 tab qam x 7 days and increase to 1 qday if tolerated. Sent to [place]. Gave meds to [place] with the impression that Rosann Auerbach and Jonni Sanger knew about this.

## 2017-08-30 DIAGNOSIS — I5023 Acute on chronic systolic (congestive) heart failure: Secondary | ICD-10-CM | POA: Diagnosis not present

## 2017-08-30 DIAGNOSIS — I5031 Acute diastolic (congestive) heart failure: Secondary | ICD-10-CM | POA: Diagnosis not present

## 2017-08-30 DIAGNOSIS — L97521 Non-pressure chronic ulcer of other part of left foot limited to breakdown of skin: Secondary | ICD-10-CM | POA: Diagnosis not present

## 2017-08-30 DIAGNOSIS — E785 Hyperlipidemia, unspecified: Secondary | ICD-10-CM | POA: Diagnosis not present

## 2017-08-30 DIAGNOSIS — E11621 Type 2 diabetes mellitus with foot ulcer: Secondary | ICD-10-CM | POA: Diagnosis not present

## 2017-08-30 DIAGNOSIS — Z86718 Personal history of other venous thrombosis and embolism: Secondary | ICD-10-CM | POA: Diagnosis not present

## 2017-08-30 DIAGNOSIS — Z7984 Long term (current) use of oral hypoglycemic drugs: Secondary | ICD-10-CM | POA: Diagnosis not present

## 2017-08-30 DIAGNOSIS — I110 Hypertensive heart disease with heart failure: Secondary | ICD-10-CM | POA: Diagnosis not present

## 2017-08-30 DIAGNOSIS — E039 Hypothyroidism, unspecified: Secondary | ICD-10-CM | POA: Diagnosis not present

## 2017-08-30 DIAGNOSIS — Z9181 History of falling: Secondary | ICD-10-CM | POA: Diagnosis not present

## 2017-09-02 ENCOUNTER — Other Ambulatory Visit: Payer: Self-pay

## 2017-09-02 DIAGNOSIS — L97521 Non-pressure chronic ulcer of other part of left foot limited to breakdown of skin: Secondary | ICD-10-CM | POA: Diagnosis not present

## 2017-09-02 DIAGNOSIS — Z7984 Long term (current) use of oral hypoglycemic drugs: Secondary | ICD-10-CM | POA: Diagnosis not present

## 2017-09-02 DIAGNOSIS — E11621 Type 2 diabetes mellitus with foot ulcer: Secondary | ICD-10-CM | POA: Diagnosis not present

## 2017-09-02 DIAGNOSIS — E785 Hyperlipidemia, unspecified: Secondary | ICD-10-CM | POA: Diagnosis not present

## 2017-09-02 DIAGNOSIS — E039 Hypothyroidism, unspecified: Secondary | ICD-10-CM | POA: Diagnosis not present

## 2017-09-02 DIAGNOSIS — I110 Hypertensive heart disease with heart failure: Secondary | ICD-10-CM | POA: Diagnosis not present

## 2017-09-02 DIAGNOSIS — Z9181 History of falling: Secondary | ICD-10-CM | POA: Diagnosis not present

## 2017-09-02 DIAGNOSIS — I5031 Acute diastolic (congestive) heart failure: Secondary | ICD-10-CM | POA: Diagnosis not present

## 2017-09-02 DIAGNOSIS — I5023 Acute on chronic systolic (congestive) heart failure: Secondary | ICD-10-CM | POA: Diagnosis not present

## 2017-09-02 DIAGNOSIS — Z86718 Personal history of other venous thrombosis and embolism: Secondary | ICD-10-CM | POA: Diagnosis not present

## 2017-09-03 DIAGNOSIS — R410 Disorientation, unspecified: Secondary | ICD-10-CM | POA: Diagnosis not present

## 2017-09-03 DIAGNOSIS — E11621 Type 2 diabetes mellitus with foot ulcer: Secondary | ICD-10-CM | POA: Diagnosis not present

## 2017-09-03 DIAGNOSIS — E039 Hypothyroidism, unspecified: Secondary | ICD-10-CM | POA: Diagnosis not present

## 2017-09-03 DIAGNOSIS — I5031 Acute diastolic (congestive) heart failure: Secondary | ICD-10-CM | POA: Diagnosis not present

## 2017-09-03 DIAGNOSIS — Z86718 Personal history of other venous thrombosis and embolism: Secondary | ICD-10-CM | POA: Diagnosis not present

## 2017-09-03 DIAGNOSIS — Z9181 History of falling: Secondary | ICD-10-CM | POA: Diagnosis not present

## 2017-09-03 DIAGNOSIS — Z7984 Long term (current) use of oral hypoglycemic drugs: Secondary | ICD-10-CM | POA: Diagnosis not present

## 2017-09-03 DIAGNOSIS — I110 Hypertensive heart disease with heart failure: Secondary | ICD-10-CM | POA: Diagnosis not present

## 2017-09-03 DIAGNOSIS — M6281 Muscle weakness (generalized): Secondary | ICD-10-CM | POA: Diagnosis not present

## 2017-09-03 DIAGNOSIS — I5023 Acute on chronic systolic (congestive) heart failure: Secondary | ICD-10-CM | POA: Diagnosis not present

## 2017-09-03 DIAGNOSIS — E785 Hyperlipidemia, unspecified: Secondary | ICD-10-CM | POA: Diagnosis not present

## 2017-09-03 DIAGNOSIS — L97521 Non-pressure chronic ulcer of other part of left foot limited to breakdown of skin: Secondary | ICD-10-CM | POA: Diagnosis not present

## 2017-09-04 DIAGNOSIS — Z7984 Long term (current) use of oral hypoglycemic drugs: Secondary | ICD-10-CM | POA: Diagnosis not present

## 2017-09-04 DIAGNOSIS — L97521 Non-pressure chronic ulcer of other part of left foot limited to breakdown of skin: Secondary | ICD-10-CM | POA: Diagnosis not present

## 2017-09-04 DIAGNOSIS — I5023 Acute on chronic systolic (congestive) heart failure: Secondary | ICD-10-CM | POA: Diagnosis not present

## 2017-09-04 DIAGNOSIS — Z86718 Personal history of other venous thrombosis and embolism: Secondary | ICD-10-CM | POA: Diagnosis not present

## 2017-09-04 DIAGNOSIS — I110 Hypertensive heart disease with heart failure: Secondary | ICD-10-CM | POA: Diagnosis not present

## 2017-09-04 DIAGNOSIS — Z9181 History of falling: Secondary | ICD-10-CM | POA: Diagnosis not present

## 2017-09-04 DIAGNOSIS — E11621 Type 2 diabetes mellitus with foot ulcer: Secondary | ICD-10-CM | POA: Diagnosis not present

## 2017-09-04 DIAGNOSIS — E039 Hypothyroidism, unspecified: Secondary | ICD-10-CM | POA: Diagnosis not present

## 2017-09-04 DIAGNOSIS — I5031 Acute diastolic (congestive) heart failure: Secondary | ICD-10-CM | POA: Diagnosis not present

## 2017-09-04 DIAGNOSIS — E785 Hyperlipidemia, unspecified: Secondary | ICD-10-CM | POA: Diagnosis not present

## 2017-09-05 ENCOUNTER — Telehealth: Payer: Self-pay

## 2017-09-05 ENCOUNTER — Emergency Department
Admission: EM | Admit: 2017-09-05 | Discharge: 2017-09-05 | Disposition: A | Discharge status: Home / Self Care | Payer: Medicare Other | Attending: Emergency Medicine | Admitting: Emergency Medicine

## 2017-09-05 DIAGNOSIS — I5022 Chronic systolic (congestive) heart failure: Secondary | ICD-10-CM | POA: Diagnosis not present

## 2017-09-05 DIAGNOSIS — E039 Hypothyroidism, unspecified: Secondary | ICD-10-CM | POA: Insufficient documentation

## 2017-09-05 DIAGNOSIS — Z7982 Long term (current) use of aspirin: Secondary | ICD-10-CM | POA: Insufficient documentation

## 2017-09-05 DIAGNOSIS — I110 Hypertensive heart disease with heart failure: Secondary | ICD-10-CM | POA: Insufficient documentation

## 2017-09-05 DIAGNOSIS — N390 Urinary tract infection, site not specified: Secondary | ICD-10-CM | POA: Diagnosis not present

## 2017-09-05 DIAGNOSIS — Z7984 Long term (current) use of oral hypoglycemic drugs: Secondary | ICD-10-CM | POA: Diagnosis not present

## 2017-09-05 DIAGNOSIS — Z86718 Personal history of other venous thrombosis and embolism: Secondary | ICD-10-CM | POA: Diagnosis not present

## 2017-09-05 DIAGNOSIS — I5023 Acute on chronic systolic (congestive) heart failure: Secondary | ICD-10-CM | POA: Diagnosis not present

## 2017-09-05 DIAGNOSIS — E119 Type 2 diabetes mellitus without complications: Secondary | ICD-10-CM | POA: Insufficient documentation

## 2017-09-05 DIAGNOSIS — E11621 Type 2 diabetes mellitus with foot ulcer: Secondary | ICD-10-CM | POA: Diagnosis not present

## 2017-09-05 DIAGNOSIS — Z9181 History of falling: Secondary | ICD-10-CM | POA: Diagnosis not present

## 2017-09-05 DIAGNOSIS — E785 Hyperlipidemia, unspecified: Secondary | ICD-10-CM | POA: Diagnosis not present

## 2017-09-05 DIAGNOSIS — Z79899 Other long term (current) drug therapy: Secondary | ICD-10-CM | POA: Diagnosis not present

## 2017-09-05 DIAGNOSIS — L97521 Non-pressure chronic ulcer of other part of left foot limited to breakdown of skin: Secondary | ICD-10-CM | POA: Diagnosis not present

## 2017-09-05 DIAGNOSIS — I5031 Acute diastolic (congestive) heart failure: Secondary | ICD-10-CM | POA: Diagnosis not present

## 2017-09-05 DIAGNOSIS — R451 Restlessness and agitation: Secondary | ICD-10-CM | POA: Diagnosis not present

## 2017-09-05 LAB — BASIC METABOLIC PANEL
ANION GAP: 10 (ref 5–15)
BUN: 37 mg/dL — AB (ref 6–20)
CALCIUM: 9.6 mg/dL (ref 8.9–10.3)
CO2: 26 mmol/L (ref 22–32)
Chloride: 103 mmol/L (ref 101–111)
Creatinine, Ser: 1 mg/dL (ref 0.44–1.00)
GFR calc Af Amer: 55 mL/min — ABNORMAL LOW (ref >60)
GFR, EST NON AFRICAN AMERICAN: 47 mL/min — AB (ref >60)
GLUCOSE: 112 mg/dL — AB (ref 65–99)
POTASSIUM: 4.1 mmol/L (ref 3.5–5.1)
SODIUM: 139 mmol/L (ref 135–145)

## 2017-09-05 LAB — URINALYSIS, COMPLETE (UACMP) WITH MICROSCOPIC
Bilirubin Urine: NEGATIVE
Glucose, UA: NEGATIVE mg/dL
Hgb urine dipstick: NEGATIVE
KETONES UR: NEGATIVE mg/dL
Nitrite: NEGATIVE
Protein, ur: NEGATIVE mg/dL
Specific Gravity, Urine: 1.009 (ref 1.005–1.030)
pH: 5 (ref 5.0–8.0)

## 2017-09-05 LAB — CBC
HCT: 40.3 % (ref 35.0–47.0)
HEMOGLOBIN: 13.5 g/dL (ref 12.0–16.0)
MCH: 31.7 pg (ref 26.0–34.0)
MCHC: 33.4 g/dL (ref 32.0–36.0)
MCV: 94.9 fL (ref 80.0–100.0)
Platelets: 168 10*3/uL (ref 150–440)
RBC: 4.25 MIL/uL (ref 3.80–5.20)
RDW: 17.7 % — AB (ref 11.5–14.5)
WBC: 7.7 10*3/uL (ref 3.6–11.0)

## 2017-09-05 MED ORDER — CEPHALEXIN 250 MG PO CAPS: 250.0000 mg | ORAL_CAPSULE | Freq: Three times a day (TID) | ORAL | 0 refills | Status: AC

## 2017-09-05 NOTE — Telephone Encounter (Signed)
Home nurse called and said Lorraine Fischer is weak and urine sample came back concerning for sepsis. Was told to send to hospital. Pt does not have a fever per nurse, but she is acting as if she may be septic

## 2017-09-05 NOTE — ED Triage Notes (Signed)
Pt brought in by [facility] from home with family.  Per EMS, pt had urine collected by a nurse for Dr. Ronnald Ramp on Monday for possible UTI.  Family called EMS because they were concerned regarding UTI.  Per family pt is at baseline mentality.  Pt has had no complaints and is NAD at this time.

## 2017-09-05 NOTE — ED Notes (Signed)
Pt states she was hurting some in her chest and then it stopped. Pt states it felt like heaviness. MD aware

## 2017-09-05 NOTE — ED Provider Notes (Signed)
[facility] Emergency Department Provider Note  Time seen: 1:34 PM  I have reviewed the triage vital signs and the nursing notes.   HISTORY  Chief Complaint Urinary Tract Infection    HPI Lorraine Fischer is a 84 y.o. female with a past medical history of diabetes, hypertension, hyperlipidemia, presents to the emergency department for possible sepsis.  According to the family they had lab work performed yesterday, they are not sure entirely what labs were performed.  That they had a phone call from the patient's primary care doctor today, states the home health nurse had called the primary care doctor and there is abnormal lab results of greater than 30,000 white blood cell count, the patient was told to go to the emergency department for evaluation.  Family states no fever, they have noticed some increased agitation but this has been progressive over the past 3-4 weeks.  Patient has been started on new medications to help with the agitation which has been helping per family.  They deny any fever complaints of pain, vomiting at home.  Past Medical History:  Diagnosis Date  . Diabetes mellitus without complication ([place])   . DVT (deep venous thrombosis) ([place])   . Hyperlipidemia   . Hypertension   . Thyroid disease     Patient Active Problem List   Diagnosis Date Noted  . Chronic systolic heart failure ([place]) 06/12/2017  . Diabetes ([place]) 06/12/2017  . Hyperlipidemia 03/22/2017  . Carotid stenosis 03/22/2017  . DVT (deep venous thrombosis) ([place]) 03/22/2017  . Adult hypothyroidism 02/27/2017  . Multiple fractures 09/20/2016  . Frequent falls 09/20/2016  . Recurrent cystitis 09/20/2016  . Osteoporosis 11/08/2015  . Essential hypertension 11/08/2015    Past Surgical History:  Procedure Laterality Date  . ANKLE SURGERY    . HIP FRACTURE SURGERY    . WRIST SURGERY Right     Prior to Admission medications   Medication Sig Start Date End Date Taking?  Authorizing Provider  aspirin 81 MG chewable tablet Chew 81 mg by mouth daily.    [provider]  docusate sodium (COLACE) 100 MG capsule Take 1 capsule (100 mg total) by mouth 2 (two) times daily as needed for mild constipation. 06/06/17   Vaughan Basta, MD  furosemide (LASIX) 40 MG tablet TAKE 1 TABLET BY MOUTH ONCE DAILY 07/09/17   Algernon [place], MD  gabapentin (NEURONTIN) 100 MG capsule Take 1 capsule (100 mg total) by mouth at bedtime. 08/29/17   Juline Patch, MD  levothyroxine (SYNTHROID, LEVOTHROID) 50 MCG tablet Take 1 tablet (50 mcg total) by mouth daily before breakfast. 02/27/17   Juline Patch, MD  lisinopril (PRINIVIL,ZESTRIL) 10 MG tablet Take 1 tablet (10 mg total) by mouth daily. 06/07/17   Vaughan Basta, MD  metFORMIN (GLUCOPHAGE) 500 MG tablet TAKE ONE TABLET BY MOUTH ONCE DAILY WITH BREAKFAT 02/27/17   Juline Patch, MD  metoprolol succinate (TOPROL-XL) 50 MG 24 hr tablet Take 1 tablet (50 mg total) by mouth daily. Take with or immediately following a meal. 02/27/17   Juline Patch, MD  risperiDONE (RISPERDAL) 0.25 MG tablet Take 1 tablet (0.25 mg total) by mouth at bedtime. Take one tablet daily x 1 week- if tolerated, may increase to 1 tablet BID there after 08/29/17   Juline Patch, MD  sertraline (ZOLOFT) 25 MG tablet Take 1 tablet (25 mg total) by mouth daily. Start 1/2 tablet daily for 7 days, then if tolerated, increase to 1 tablet  daily there after. 08/29/17   Juline Patch, MD  spironolactone (ALDACTONE) 25 MG tablet Take 0.5 tablets (12.5 mg total) by mouth daily. 06/07/17   Vaughan Basta, MD    Allergies  Allergen Reactions  . Sulfa Antibiotics Shortness Of Breath  . Sulfasalazine Shortness Of Breath    Family History  Problem Relation Age of Onset  . Hypertension Mother     Social History Social History   Tobacco Use  . Smoking status: Never Smoker  . Smokeless tobacco: Never Used  Substance Use Topics  .  Alcohol use: No  . Drug use: No    Review of Systems Patient is an extremely poor historian with some confusion at baseline and cannot provide an accurate/add acute review of systems.  ____________________________________________   PHYSICAL EXAM:  VITAL SIGNS: ED Triage Vitals  Enc Vitals Group     BP 09/05/17 1253 (!) 152/88     Pulse Rate 09/05/17 1253 79     Resp 09/05/17 1253 20     Temp 09/05/17 1253 (!) 97.4 F (36.3 C)     Temp Source 09/05/17 1253 Oral     SpO2 09/05/17 1253 99 %     Weight 09/05/17 1253 110 lb (49.9 kg)     Height --      Head Circumference --      Peak Flow --      Pain Score 09/05/17 1304 2     Pain Loc --      Pain Edu? --      Excl. in [place]? --    Constitutional: Alert and oriented. Well appearing and in no distress. Eyes: Normal exam ENT   Head: Normocephalic and atraumatic.   Mouth/Throat: Mucous membranes are moist. Cardiovascular: Normal rate, regular rhythm. No murmur Respiratory: Normal respiratory effort without tachypnea nor retractions. Breath sounds are clear  Gastrointestinal: Soft and nontender. No distention.  Musculoskeletal: Nontender with normal range of motion in all extremities. Neurologic:  Normal speech and language. No gross focal neurologic deficits Skin:  Skin is warm, dry and intact.  Psychiatric: Mood and affect are normal.  ____________________________________________    EKG  EKG reviewed and interpreted by myself shows what appears to be sinus rhythm at 69 bpm with a narrow QRS, normal axis, largely normal intervals with nonspecific ST changes.  Slight ST depression in the lateral leads.  Largely unchanged from prior EKG 06/03/17.  ____________________________________________   INITIAL IMPRESSION / ASSESSMENT AND PLAN / ED COURSE  Pertinent labs & imaging results that were available during my care of the patient were reviewed by me and considered in my medical decision making (see chart for  details).  Patient presents to the emergency department for possible sepsis/urinary tract infection.  I reviewed the patient's records including her outpatient lab work.  Patient did not have blood work performed, had a urinalysis showing greater than 30 white blood cells in the urinalysis.  Differential would include urinary tract infection, sepsis, agitation.  Family states over the past 1 month the patient has had progressive agitation and confusion, has been seen by her doctor started on new medications for this which have been helpful per family.  Patient's urinalysis today shows 6-30 white blood cells.  I reviewed the urinalysis from yesterday showing greater than 30 white blood cells with white blood cell clumps.  Given this finding we will send a urine culture and start the patient on antibiotics.  Patient's lab work today including her white blood cell count  is normal.  Vitals are normal including her heart rate oxygen saturation and she remains afebrile.  Nurse did note an episode of irregular heart rhythm which she was concerned could be ventricular tachycardia.  She printed out the rhythm strip however it does appear consistent with electrical interference with a underlying normal sinus rhythm.  EKG performed which is unchanged from prior patient has no chest pain or shortness of breath symptoms.  ____________________________________________   FINAL CLINICAL IMPRESSION(S) / ED DIAGNOSES  Urinary tract infection    Harvest Dark, MD 09/05/17 1339

## 2017-09-05 NOTE — ED Notes (Signed)
Family at bedside. 

## 2017-09-06 ENCOUNTER — Other Ambulatory Visit: Payer: Self-pay

## 2017-09-06 DIAGNOSIS — E039 Hypothyroidism, unspecified: Secondary | ICD-10-CM | POA: Diagnosis not present

## 2017-09-06 DIAGNOSIS — Z9181 History of falling: Secondary | ICD-10-CM | POA: Diagnosis not present

## 2017-09-06 DIAGNOSIS — I110 Hypertensive heart disease with heart failure: Secondary | ICD-10-CM | POA: Diagnosis not present

## 2017-09-06 DIAGNOSIS — I5031 Acute diastolic (congestive) heart failure: Secondary | ICD-10-CM | POA: Diagnosis not present

## 2017-09-06 DIAGNOSIS — Z86718 Personal history of other venous thrombosis and embolism: Secondary | ICD-10-CM | POA: Diagnosis not present

## 2017-09-06 DIAGNOSIS — L97521 Non-pressure chronic ulcer of other part of left foot limited to breakdown of skin: Secondary | ICD-10-CM | POA: Diagnosis not present

## 2017-09-06 DIAGNOSIS — I5023 Acute on chronic systolic (congestive) heart failure: Secondary | ICD-10-CM | POA: Diagnosis not present

## 2017-09-06 DIAGNOSIS — Z7984 Long term (current) use of oral hypoglycemic drugs: Secondary | ICD-10-CM | POA: Diagnosis not present

## 2017-09-06 DIAGNOSIS — E785 Hyperlipidemia, unspecified: Secondary | ICD-10-CM | POA: Diagnosis not present

## 2017-09-06 DIAGNOSIS — E11621 Type 2 diabetes mellitus with foot ulcer: Secondary | ICD-10-CM | POA: Diagnosis not present

## 2017-09-07 LAB — URINE CULTURE

## 2017-09-08 NOTE — Progress Notes (Signed)
ED Antimicrobial Stewardship Positive Culture Follow Up   Lorraine Fischer is an 84 y.o. female who presented to [facility] on 09/05/2017 with a chief complaint of UTI.   Chief Complaint  Patient presents with  . Urinary Tract Infection    Recent Results (from the past 720 hour(s))  Urine Culture     Status: Abnormal   Collection Time: 09/05/17 12:50 PM  Result Value Ref Range Status   Specimen Description   Final    URINE, RANDOM Performed at [facility], [address] [place], [place] [address]    Special Requests   Final    NONE Performed at [facility], [address] [place], [place] [address]    Culture (A)  Final    >=100,000 COLONIES/mL ESCHERICHIA COLI Confirmed Extended Spectrum Beta-Lactamase Producer (ESBL).  In bloodstream infections from ESBL organisms, carbapenems are preferred over piperacillin/tazobactam. They are shown to have a lower risk of mortality. Performed at [facility], [place] [address] [place], [place] [address]    Report Status 09/07/2017 FINAL  Final   Organism ID, Bacteria ESCHERICHIA COLI (A)  Final      Susceptibility   Escherichia coli - MIC*    AMPICILLIN >=32 RESISTANT Resistant     CEFAZOLIN >=64 RESISTANT Resistant     CEFTRIAXONE RESISTANT Resistant     CIPROFLOXACIN >=4 RESISTANT Resistant     GENTAMICIN <=1 SENSITIVE Sensitive     IMIPENEM <=0.25 SENSITIVE Sensitive     NITROFURANTOIN <=16 SENSITIVE Sensitive     TRIMETH/SULFA <=20 SENSITIVE Sensitive     AMPICILLIN/SULBACTAM 8 SENSITIVE Sensitive     PIP/TAZO <=4 SENSITIVE Sensitive     Extended ESBL POSITIVE Resistant     * >=100,000 COLONIES/mL ESCHERICHIA COLI    [x]  Treated with Keflex, organism resistant to prescribed antimicrobial []  Patient discharged originally without antimicrobial agent and treatment is now indicated  New antibiotic prescription: Fosfomycin 3g once  ED Provider: Dr. Mable Paris    Talked to patient's son who is in  charge of her medications. Prescription sent to [facility] in [place].    Candelaria Stagers, PharmD 09/08/2017, 2:53 PM  Pharmacy Resident

## 2017-09-09 ENCOUNTER — Other Ambulatory Visit: Payer: Self-pay

## 2017-09-09 DIAGNOSIS — E785 Hyperlipidemia, unspecified: Secondary | ICD-10-CM | POA: Diagnosis not present

## 2017-09-09 DIAGNOSIS — N3090 Cystitis, unspecified without hematuria: Secondary | ICD-10-CM

## 2017-09-09 DIAGNOSIS — E11621 Type 2 diabetes mellitus with foot ulcer: Secondary | ICD-10-CM | POA: Diagnosis not present

## 2017-09-09 DIAGNOSIS — L97521 Non-pressure chronic ulcer of other part of left foot limited to breakdown of skin: Secondary | ICD-10-CM | POA: Diagnosis not present

## 2017-09-09 DIAGNOSIS — E039 Hypothyroidism, unspecified: Secondary | ICD-10-CM | POA: Diagnosis not present

## 2017-09-09 DIAGNOSIS — Z86718 Personal history of other venous thrombosis and embolism: Secondary | ICD-10-CM | POA: Diagnosis not present

## 2017-09-09 DIAGNOSIS — Z9181 History of falling: Secondary | ICD-10-CM | POA: Diagnosis not present

## 2017-09-09 DIAGNOSIS — I5023 Acute on chronic systolic (congestive) heart failure: Secondary | ICD-10-CM | POA: Diagnosis not present

## 2017-09-09 DIAGNOSIS — I110 Hypertensive heart disease with heart failure: Secondary | ICD-10-CM | POA: Diagnosis not present

## 2017-09-09 DIAGNOSIS — R296 Repeated falls: Secondary | ICD-10-CM

## 2017-09-09 DIAGNOSIS — Z7984 Long term (current) use of oral hypoglycemic drugs: Secondary | ICD-10-CM | POA: Diagnosis not present

## 2017-09-09 DIAGNOSIS — I5031 Acute diastolic (congestive) heart failure: Secondary | ICD-10-CM | POA: Diagnosis not present

## 2017-09-10 DIAGNOSIS — Z86718 Personal history of other venous thrombosis and embolism: Secondary | ICD-10-CM | POA: Diagnosis not present

## 2017-09-10 DIAGNOSIS — I110 Hypertensive heart disease with heart failure: Secondary | ICD-10-CM | POA: Diagnosis not present

## 2017-09-10 DIAGNOSIS — E11621 Type 2 diabetes mellitus with foot ulcer: Secondary | ICD-10-CM | POA: Diagnosis not present

## 2017-09-10 DIAGNOSIS — I5023 Acute on chronic systolic (congestive) heart failure: Secondary | ICD-10-CM | POA: Diagnosis not present

## 2017-09-10 DIAGNOSIS — E785 Hyperlipidemia, unspecified: Secondary | ICD-10-CM | POA: Diagnosis not present

## 2017-09-10 DIAGNOSIS — E039 Hypothyroidism, unspecified: Secondary | ICD-10-CM | POA: Diagnosis not present

## 2017-09-10 DIAGNOSIS — I5031 Acute diastolic (congestive) heart failure: Secondary | ICD-10-CM | POA: Diagnosis not present

## 2017-09-10 DIAGNOSIS — Z7984 Long term (current) use of oral hypoglycemic drugs: Secondary | ICD-10-CM | POA: Diagnosis not present

## 2017-09-10 DIAGNOSIS — L97521 Non-pressure chronic ulcer of other part of left foot limited to breakdown of skin: Secondary | ICD-10-CM | POA: Diagnosis not present

## 2017-09-10 DIAGNOSIS — Z9181 History of falling: Secondary | ICD-10-CM | POA: Diagnosis not present

## 2017-09-12 DIAGNOSIS — E039 Hypothyroidism, unspecified: Secondary | ICD-10-CM | POA: Diagnosis not present

## 2017-09-12 DIAGNOSIS — I110 Hypertensive heart disease with heart failure: Secondary | ICD-10-CM | POA: Diagnosis not present

## 2017-09-12 DIAGNOSIS — I5031 Acute diastolic (congestive) heart failure: Secondary | ICD-10-CM | POA: Diagnosis not present

## 2017-09-12 DIAGNOSIS — L97521 Non-pressure chronic ulcer of other part of left foot limited to breakdown of skin: Secondary | ICD-10-CM | POA: Diagnosis not present

## 2017-09-12 DIAGNOSIS — Z7984 Long term (current) use of oral hypoglycemic drugs: Secondary | ICD-10-CM | POA: Diagnosis not present

## 2017-09-12 DIAGNOSIS — I5023 Acute on chronic systolic (congestive) heart failure: Secondary | ICD-10-CM | POA: Diagnosis not present

## 2017-09-12 DIAGNOSIS — Z86718 Personal history of other venous thrombosis and embolism: Secondary | ICD-10-CM | POA: Diagnosis not present

## 2017-09-12 DIAGNOSIS — E785 Hyperlipidemia, unspecified: Secondary | ICD-10-CM | POA: Diagnosis not present

## 2017-09-12 DIAGNOSIS — E11621 Type 2 diabetes mellitus with foot ulcer: Secondary | ICD-10-CM | POA: Diagnosis not present

## 2017-09-12 DIAGNOSIS — Z9181 History of falling: Secondary | ICD-10-CM | POA: Diagnosis not present

## 2017-09-13 DIAGNOSIS — L97521 Non-pressure chronic ulcer of other part of left foot limited to breakdown of skin: Secondary | ICD-10-CM | POA: Diagnosis not present

## 2017-09-13 DIAGNOSIS — E039 Hypothyroidism, unspecified: Secondary | ICD-10-CM | POA: Diagnosis not present

## 2017-09-13 DIAGNOSIS — Z9181 History of falling: Secondary | ICD-10-CM | POA: Diagnosis not present

## 2017-09-13 DIAGNOSIS — E785 Hyperlipidemia, unspecified: Secondary | ICD-10-CM | POA: Diagnosis not present

## 2017-09-13 DIAGNOSIS — I110 Hypertensive heart disease with heart failure: Secondary | ICD-10-CM | POA: Diagnosis not present

## 2017-09-13 DIAGNOSIS — I5023 Acute on chronic systolic (congestive) heart failure: Secondary | ICD-10-CM | POA: Diagnosis not present

## 2017-09-13 DIAGNOSIS — Z7984 Long term (current) use of oral hypoglycemic drugs: Secondary | ICD-10-CM | POA: Diagnosis not present

## 2017-09-13 DIAGNOSIS — I5031 Acute diastolic (congestive) heart failure: Secondary | ICD-10-CM | POA: Diagnosis not present

## 2017-09-13 DIAGNOSIS — Z86718 Personal history of other venous thrombosis and embolism: Secondary | ICD-10-CM | POA: Diagnosis not present

## 2017-09-13 DIAGNOSIS — E11621 Type 2 diabetes mellitus with foot ulcer: Secondary | ICD-10-CM | POA: Diagnosis not present

## 2017-09-16 DIAGNOSIS — Z86718 Personal history of other venous thrombosis and embolism: Secondary | ICD-10-CM | POA: Diagnosis not present

## 2017-09-16 DIAGNOSIS — I5031 Acute diastolic (congestive) heart failure: Secondary | ICD-10-CM | POA: Diagnosis not present

## 2017-09-16 DIAGNOSIS — L97521 Non-pressure chronic ulcer of other part of left foot limited to breakdown of skin: Secondary | ICD-10-CM | POA: Diagnosis not present

## 2017-09-16 DIAGNOSIS — Z7984 Long term (current) use of oral hypoglycemic drugs: Secondary | ICD-10-CM | POA: Diagnosis not present

## 2017-09-16 DIAGNOSIS — I5023 Acute on chronic systolic (congestive) heart failure: Secondary | ICD-10-CM | POA: Diagnosis not present

## 2017-09-16 DIAGNOSIS — I110 Hypertensive heart disease with heart failure: Secondary | ICD-10-CM | POA: Diagnosis not present

## 2017-09-16 DIAGNOSIS — E785 Hyperlipidemia, unspecified: Secondary | ICD-10-CM | POA: Diagnosis not present

## 2017-09-16 DIAGNOSIS — E11621 Type 2 diabetes mellitus with foot ulcer: Secondary | ICD-10-CM | POA: Diagnosis not present

## 2017-09-16 DIAGNOSIS — Z9181 History of falling: Secondary | ICD-10-CM | POA: Diagnosis not present

## 2017-09-16 DIAGNOSIS — E039 Hypothyroidism, unspecified: Secondary | ICD-10-CM | POA: Diagnosis not present

## 2017-09-17 DIAGNOSIS — L97521 Non-pressure chronic ulcer of other part of left foot limited to breakdown of skin: Secondary | ICD-10-CM | POA: Diagnosis not present

## 2017-09-17 DIAGNOSIS — Z86718 Personal history of other venous thrombosis and embolism: Secondary | ICD-10-CM | POA: Diagnosis not present

## 2017-09-17 DIAGNOSIS — Z7984 Long term (current) use of oral hypoglycemic drugs: Secondary | ICD-10-CM | POA: Diagnosis not present

## 2017-09-17 DIAGNOSIS — Z9181 History of falling: Secondary | ICD-10-CM | POA: Diagnosis not present

## 2017-09-17 DIAGNOSIS — I5023 Acute on chronic systolic (congestive) heart failure: Secondary | ICD-10-CM | POA: Diagnosis not present

## 2017-09-17 DIAGNOSIS — I110 Hypertensive heart disease with heart failure: Secondary | ICD-10-CM | POA: Diagnosis not present

## 2017-09-17 DIAGNOSIS — E11621 Type 2 diabetes mellitus with foot ulcer: Secondary | ICD-10-CM | POA: Diagnosis not present

## 2017-09-17 DIAGNOSIS — I5031 Acute diastolic (congestive) heart failure: Secondary | ICD-10-CM | POA: Diagnosis not present

## 2017-09-17 DIAGNOSIS — E785 Hyperlipidemia, unspecified: Secondary | ICD-10-CM | POA: Diagnosis not present

## 2017-09-17 DIAGNOSIS — E039 Hypothyroidism, unspecified: Secondary | ICD-10-CM | POA: Diagnosis not present

## 2017-09-18 DIAGNOSIS — E11621 Type 2 diabetes mellitus with foot ulcer: Secondary | ICD-10-CM | POA: Diagnosis not present

## 2017-09-18 DIAGNOSIS — I5023 Acute on chronic systolic (congestive) heart failure: Secondary | ICD-10-CM | POA: Diagnosis not present

## 2017-09-18 DIAGNOSIS — Z86718 Personal history of other venous thrombosis and embolism: Secondary | ICD-10-CM | POA: Diagnosis not present

## 2017-09-18 DIAGNOSIS — I5031 Acute diastolic (congestive) heart failure: Secondary | ICD-10-CM | POA: Diagnosis not present

## 2017-09-18 DIAGNOSIS — L97521 Non-pressure chronic ulcer of other part of left foot limited to breakdown of skin: Secondary | ICD-10-CM | POA: Diagnosis not present

## 2017-09-18 DIAGNOSIS — Z9181 History of falling: Secondary | ICD-10-CM | POA: Diagnosis not present

## 2017-09-18 DIAGNOSIS — Z7984 Long term (current) use of oral hypoglycemic drugs: Secondary | ICD-10-CM | POA: Diagnosis not present

## 2017-09-18 DIAGNOSIS — I110 Hypertensive heart disease with heart failure: Secondary | ICD-10-CM | POA: Diagnosis not present

## 2017-09-18 DIAGNOSIS — E785 Hyperlipidemia, unspecified: Secondary | ICD-10-CM | POA: Diagnosis not present

## 2017-09-18 DIAGNOSIS — E039 Hypothyroidism, unspecified: Secondary | ICD-10-CM | POA: Diagnosis not present

## 2017-09-20 ENCOUNTER — Other Ambulatory Visit: Payer: Self-pay

## 2017-09-20 ENCOUNTER — Telehealth: Payer: Self-pay

## 2017-09-20 DIAGNOSIS — I110 Hypertensive heart disease with heart failure: Secondary | ICD-10-CM | POA: Diagnosis not present

## 2017-09-20 DIAGNOSIS — Z7984 Long term (current) use of oral hypoglycemic drugs: Secondary | ICD-10-CM | POA: Diagnosis not present

## 2017-09-20 DIAGNOSIS — Z86718 Personal history of other venous thrombosis and embolism: Secondary | ICD-10-CM | POA: Diagnosis not present

## 2017-09-20 DIAGNOSIS — N3000 Acute cystitis without hematuria: Secondary | ICD-10-CM

## 2017-09-20 DIAGNOSIS — I5031 Acute diastolic (congestive) heart failure: Secondary | ICD-10-CM | POA: Diagnosis not present

## 2017-09-20 DIAGNOSIS — E785 Hyperlipidemia, unspecified: Secondary | ICD-10-CM | POA: Diagnosis not present

## 2017-09-20 DIAGNOSIS — I5023 Acute on chronic systolic (congestive) heart failure: Secondary | ICD-10-CM | POA: Diagnosis not present

## 2017-09-20 DIAGNOSIS — L97521 Non-pressure chronic ulcer of other part of left foot limited to breakdown of skin: Secondary | ICD-10-CM | POA: Diagnosis not present

## 2017-09-20 DIAGNOSIS — Z9181 History of falling: Secondary | ICD-10-CM | POA: Diagnosis not present

## 2017-09-20 DIAGNOSIS — E11621 Type 2 diabetes mellitus with foot ulcer: Secondary | ICD-10-CM | POA: Diagnosis not present

## 2017-09-20 DIAGNOSIS — E039 Hypothyroidism, unspecified: Secondary | ICD-10-CM | POA: Diagnosis not present

## 2017-09-20 MED ORDER — NITROFURANTOIN MONOHYD MACRO 100 MG PO CAPS: 100.0000 mg | ORAL_CAPSULE | Freq: Two times a day (BID) | ORAL | 0 refills | Status: AC

## 2017-09-20 NOTE — Telephone Encounter (Signed)
Lattie Haw called from [place] stating that pt is having UTI Sxs again. We looked at culture from Jan this year. Keflex is listed as resistant on culture. Family wanted Keflex sent in, instead we went with Macrobid which is sensitive in culture. 100mg  BID x 3 days was sent to [place]. Lattie Haw was notified.

## 2017-09-23 DIAGNOSIS — E11621 Type 2 diabetes mellitus with foot ulcer: Secondary | ICD-10-CM | POA: Diagnosis not present

## 2017-09-23 DIAGNOSIS — Z9181 History of falling: Secondary | ICD-10-CM | POA: Diagnosis not present

## 2017-09-23 DIAGNOSIS — Z86718 Personal history of other venous thrombosis and embolism: Secondary | ICD-10-CM | POA: Diagnosis not present

## 2017-09-23 DIAGNOSIS — L97521 Non-pressure chronic ulcer of other part of left foot limited to breakdown of skin: Secondary | ICD-10-CM | POA: Diagnosis not present

## 2017-09-23 DIAGNOSIS — I5023 Acute on chronic systolic (congestive) heart failure: Secondary | ICD-10-CM | POA: Diagnosis not present

## 2017-09-23 DIAGNOSIS — I5031 Acute diastolic (congestive) heart failure: Secondary | ICD-10-CM | POA: Diagnosis not present

## 2017-09-23 DIAGNOSIS — Z7984 Long term (current) use of oral hypoglycemic drugs: Secondary | ICD-10-CM | POA: Diagnosis not present

## 2017-09-23 DIAGNOSIS — E039 Hypothyroidism, unspecified: Secondary | ICD-10-CM | POA: Diagnosis not present

## 2017-09-23 DIAGNOSIS — I110 Hypertensive heart disease with heart failure: Secondary | ICD-10-CM | POA: Diagnosis not present

## 2017-09-23 DIAGNOSIS — E785 Hyperlipidemia, unspecified: Secondary | ICD-10-CM | POA: Diagnosis not present

## 2017-09-24 DIAGNOSIS — Z86718 Personal history of other venous thrombosis and embolism: Secondary | ICD-10-CM | POA: Diagnosis not present

## 2017-09-24 DIAGNOSIS — I5023 Acute on chronic systolic (congestive) heart failure: Secondary | ICD-10-CM | POA: Diagnosis not present

## 2017-09-24 DIAGNOSIS — E11621 Type 2 diabetes mellitus with foot ulcer: Secondary | ICD-10-CM | POA: Diagnosis not present

## 2017-09-24 DIAGNOSIS — I5031 Acute diastolic (congestive) heart failure: Secondary | ICD-10-CM | POA: Diagnosis not present

## 2017-09-24 DIAGNOSIS — Z9181 History of falling: Secondary | ICD-10-CM | POA: Diagnosis not present

## 2017-09-24 DIAGNOSIS — E039 Hypothyroidism, unspecified: Secondary | ICD-10-CM | POA: Diagnosis not present

## 2017-09-24 DIAGNOSIS — E785 Hyperlipidemia, unspecified: Secondary | ICD-10-CM | POA: Diagnosis not present

## 2017-09-24 DIAGNOSIS — Z7984 Long term (current) use of oral hypoglycemic drugs: Secondary | ICD-10-CM | POA: Diagnosis not present

## 2017-09-24 DIAGNOSIS — L97521 Non-pressure chronic ulcer of other part of left foot limited to breakdown of skin: Secondary | ICD-10-CM | POA: Diagnosis not present

## 2017-09-24 DIAGNOSIS — I110 Hypertensive heart disease with heart failure: Secondary | ICD-10-CM | POA: Diagnosis not present

## 2017-09-26 DIAGNOSIS — E039 Hypothyroidism, unspecified: Secondary | ICD-10-CM | POA: Diagnosis not present

## 2017-09-26 DIAGNOSIS — I110 Hypertensive heart disease with heart failure: Secondary | ICD-10-CM | POA: Diagnosis not present

## 2017-09-26 DIAGNOSIS — E785 Hyperlipidemia, unspecified: Secondary | ICD-10-CM | POA: Diagnosis not present

## 2017-09-26 DIAGNOSIS — L97521 Non-pressure chronic ulcer of other part of left foot limited to breakdown of skin: Secondary | ICD-10-CM | POA: Diagnosis not present

## 2017-09-26 DIAGNOSIS — Z86718 Personal history of other venous thrombosis and embolism: Secondary | ICD-10-CM | POA: Diagnosis not present

## 2017-09-26 DIAGNOSIS — I5023 Acute on chronic systolic (congestive) heart failure: Secondary | ICD-10-CM | POA: Diagnosis not present

## 2017-09-26 DIAGNOSIS — Z9181 History of falling: Secondary | ICD-10-CM | POA: Diagnosis not present

## 2017-09-26 DIAGNOSIS — I5031 Acute diastolic (congestive) heart failure: Secondary | ICD-10-CM | POA: Diagnosis not present

## 2017-09-26 DIAGNOSIS — E11621 Type 2 diabetes mellitus with foot ulcer: Secondary | ICD-10-CM | POA: Diagnosis not present

## 2017-09-26 DIAGNOSIS — Z7984 Long term (current) use of oral hypoglycemic drugs: Secondary | ICD-10-CM | POA: Diagnosis not present

## 2017-09-27 DIAGNOSIS — L97521 Non-pressure chronic ulcer of other part of left foot limited to breakdown of skin: Secondary | ICD-10-CM | POA: Diagnosis not present

## 2017-09-27 DIAGNOSIS — I110 Hypertensive heart disease with heart failure: Secondary | ICD-10-CM | POA: Diagnosis not present

## 2017-09-27 DIAGNOSIS — E785 Hyperlipidemia, unspecified: Secondary | ICD-10-CM | POA: Diagnosis not present

## 2017-09-27 DIAGNOSIS — I5023 Acute on chronic systolic (congestive) heart failure: Secondary | ICD-10-CM | POA: Diagnosis not present

## 2017-09-27 DIAGNOSIS — Z86718 Personal history of other venous thrombosis and embolism: Secondary | ICD-10-CM | POA: Diagnosis not present

## 2017-09-27 DIAGNOSIS — Z7984 Long term (current) use of oral hypoglycemic drugs: Secondary | ICD-10-CM | POA: Diagnosis not present

## 2017-09-27 DIAGNOSIS — E11621 Type 2 diabetes mellitus with foot ulcer: Secondary | ICD-10-CM | POA: Diagnosis not present

## 2017-09-27 DIAGNOSIS — Z9181 History of falling: Secondary | ICD-10-CM | POA: Diagnosis not present

## 2017-09-27 DIAGNOSIS — I5031 Acute diastolic (congestive) heart failure: Secondary | ICD-10-CM | POA: Diagnosis not present

## 2017-09-27 DIAGNOSIS — E039 Hypothyroidism, unspecified: Secondary | ICD-10-CM | POA: Diagnosis not present

## 2017-09-30 DIAGNOSIS — L97521 Non-pressure chronic ulcer of other part of left foot limited to breakdown of skin: Secondary | ICD-10-CM | POA: Diagnosis not present

## 2017-09-30 DIAGNOSIS — E039 Hypothyroidism, unspecified: Secondary | ICD-10-CM | POA: Diagnosis not present

## 2017-09-30 DIAGNOSIS — E785 Hyperlipidemia, unspecified: Secondary | ICD-10-CM | POA: Diagnosis not present

## 2017-09-30 DIAGNOSIS — I5031 Acute diastolic (congestive) heart failure: Secondary | ICD-10-CM | POA: Diagnosis not present

## 2017-09-30 DIAGNOSIS — Z7984 Long term (current) use of oral hypoglycemic drugs: Secondary | ICD-10-CM | POA: Diagnosis not present

## 2017-09-30 DIAGNOSIS — Z9181 History of falling: Secondary | ICD-10-CM | POA: Diagnosis not present

## 2017-09-30 DIAGNOSIS — I5023 Acute on chronic systolic (congestive) heart failure: Secondary | ICD-10-CM | POA: Diagnosis not present

## 2017-09-30 DIAGNOSIS — E11621 Type 2 diabetes mellitus with foot ulcer: Secondary | ICD-10-CM | POA: Diagnosis not present

## 2017-09-30 DIAGNOSIS — I110 Hypertensive heart disease with heart failure: Secondary | ICD-10-CM | POA: Diagnosis not present

## 2017-09-30 DIAGNOSIS — Z86718 Personal history of other venous thrombosis and embolism: Secondary | ICD-10-CM | POA: Diagnosis not present

## 2017-10-01 DIAGNOSIS — L97521 Non-pressure chronic ulcer of other part of left foot limited to breakdown of skin: Secondary | ICD-10-CM | POA: Diagnosis not present

## 2017-10-01 DIAGNOSIS — I110 Hypertensive heart disease with heart failure: Secondary | ICD-10-CM | POA: Diagnosis not present

## 2017-10-01 DIAGNOSIS — Z9181 History of falling: Secondary | ICD-10-CM | POA: Diagnosis not present

## 2017-10-01 DIAGNOSIS — I5023 Acute on chronic systolic (congestive) heart failure: Secondary | ICD-10-CM | POA: Diagnosis not present

## 2017-10-01 DIAGNOSIS — Z7984 Long term (current) use of oral hypoglycemic drugs: Secondary | ICD-10-CM | POA: Diagnosis not present

## 2017-10-01 DIAGNOSIS — Z86718 Personal history of other venous thrombosis and embolism: Secondary | ICD-10-CM | POA: Diagnosis not present

## 2017-10-01 DIAGNOSIS — E11621 Type 2 diabetes mellitus with foot ulcer: Secondary | ICD-10-CM | POA: Diagnosis not present

## 2017-10-01 DIAGNOSIS — I5031 Acute diastolic (congestive) heart failure: Secondary | ICD-10-CM | POA: Diagnosis not present

## 2017-10-01 DIAGNOSIS — E039 Hypothyroidism, unspecified: Secondary | ICD-10-CM | POA: Diagnosis not present

## 2017-10-01 DIAGNOSIS — E785 Hyperlipidemia, unspecified: Secondary | ICD-10-CM | POA: Diagnosis not present

## 2017-10-03 DIAGNOSIS — L97521 Non-pressure chronic ulcer of other part of left foot limited to breakdown of skin: Secondary | ICD-10-CM | POA: Diagnosis not present

## 2017-10-03 DIAGNOSIS — Z9181 History of falling: Secondary | ICD-10-CM | POA: Diagnosis not present

## 2017-10-03 DIAGNOSIS — Z86718 Personal history of other venous thrombosis and embolism: Secondary | ICD-10-CM | POA: Diagnosis not present

## 2017-10-03 DIAGNOSIS — Z7984 Long term (current) use of oral hypoglycemic drugs: Secondary | ICD-10-CM | POA: Diagnosis not present

## 2017-10-03 DIAGNOSIS — I5031 Acute diastolic (congestive) heart failure: Secondary | ICD-10-CM | POA: Diagnosis not present

## 2017-10-03 DIAGNOSIS — E039 Hypothyroidism, unspecified: Secondary | ICD-10-CM | POA: Diagnosis not present

## 2017-10-03 DIAGNOSIS — I5023 Acute on chronic systolic (congestive) heart failure: Secondary | ICD-10-CM | POA: Diagnosis not present

## 2017-10-03 DIAGNOSIS — E11621 Type 2 diabetes mellitus with foot ulcer: Secondary | ICD-10-CM | POA: Diagnosis not present

## 2017-10-03 DIAGNOSIS — I110 Hypertensive heart disease with heart failure: Secondary | ICD-10-CM | POA: Diagnosis not present

## 2017-10-03 DIAGNOSIS — E785 Hyperlipidemia, unspecified: Secondary | ICD-10-CM | POA: Diagnosis not present

## 2017-10-04 DIAGNOSIS — E039 Hypothyroidism, unspecified: Secondary | ICD-10-CM | POA: Diagnosis not present

## 2017-10-04 DIAGNOSIS — I110 Hypertensive heart disease with heart failure: Secondary | ICD-10-CM | POA: Diagnosis not present

## 2017-10-04 DIAGNOSIS — Z86718 Personal history of other venous thrombosis and embolism: Secondary | ICD-10-CM | POA: Diagnosis not present

## 2017-10-04 DIAGNOSIS — I5031 Acute diastolic (congestive) heart failure: Secondary | ICD-10-CM | POA: Diagnosis not present

## 2017-10-04 DIAGNOSIS — Z7984 Long term (current) use of oral hypoglycemic drugs: Secondary | ICD-10-CM | POA: Diagnosis not present

## 2017-10-04 DIAGNOSIS — E785 Hyperlipidemia, unspecified: Secondary | ICD-10-CM | POA: Diagnosis not present

## 2017-10-04 DIAGNOSIS — L97521 Non-pressure chronic ulcer of other part of left foot limited to breakdown of skin: Secondary | ICD-10-CM | POA: Diagnosis not present

## 2017-10-04 DIAGNOSIS — E11621 Type 2 diabetes mellitus with foot ulcer: Secondary | ICD-10-CM | POA: Diagnosis not present

## 2017-10-04 DIAGNOSIS — Z9181 History of falling: Secondary | ICD-10-CM | POA: Diagnosis not present

## 2017-10-04 DIAGNOSIS — I5023 Acute on chronic systolic (congestive) heart failure: Secondary | ICD-10-CM | POA: Diagnosis not present

## 2017-10-05 DIAGNOSIS — L97521 Non-pressure chronic ulcer of other part of left foot limited to breakdown of skin: Secondary | ICD-10-CM | POA: Diagnosis not present

## 2017-10-05 DIAGNOSIS — I5031 Acute diastolic (congestive) heart failure: Secondary | ICD-10-CM | POA: Diagnosis not present

## 2017-10-05 DIAGNOSIS — Z7984 Long term (current) use of oral hypoglycemic drugs: Secondary | ICD-10-CM | POA: Diagnosis not present

## 2017-10-05 DIAGNOSIS — E039 Hypothyroidism, unspecified: Secondary | ICD-10-CM | POA: Diagnosis not present

## 2017-10-05 DIAGNOSIS — I5023 Acute on chronic systolic (congestive) heart failure: Secondary | ICD-10-CM | POA: Diagnosis not present

## 2017-10-05 DIAGNOSIS — I110 Hypertensive heart disease with heart failure: Secondary | ICD-10-CM | POA: Diagnosis not present

## 2017-10-05 DIAGNOSIS — Z9181 History of falling: Secondary | ICD-10-CM | POA: Diagnosis not present

## 2017-10-05 DIAGNOSIS — E11621 Type 2 diabetes mellitus with foot ulcer: Secondary | ICD-10-CM | POA: Diagnosis not present

## 2017-10-05 DIAGNOSIS — E785 Hyperlipidemia, unspecified: Secondary | ICD-10-CM | POA: Diagnosis not present

## 2017-10-05 DIAGNOSIS — Z86718 Personal history of other venous thrombosis and embolism: Secondary | ICD-10-CM | POA: Diagnosis not present

## 2017-10-07 DIAGNOSIS — E039 Hypothyroidism, unspecified: Secondary | ICD-10-CM | POA: Diagnosis not present

## 2017-10-07 DIAGNOSIS — E785 Hyperlipidemia, unspecified: Secondary | ICD-10-CM | POA: Diagnosis not present

## 2017-10-07 DIAGNOSIS — I5031 Acute diastolic (congestive) heart failure: Secondary | ICD-10-CM | POA: Diagnosis not present

## 2017-10-07 DIAGNOSIS — E11621 Type 2 diabetes mellitus with foot ulcer: Secondary | ICD-10-CM | POA: Diagnosis not present

## 2017-10-07 DIAGNOSIS — Z7984 Long term (current) use of oral hypoglycemic drugs: Secondary | ICD-10-CM | POA: Diagnosis not present

## 2017-10-07 DIAGNOSIS — Z86718 Personal history of other venous thrombosis and embolism: Secondary | ICD-10-CM | POA: Diagnosis not present

## 2017-10-07 DIAGNOSIS — Z9181 History of falling: Secondary | ICD-10-CM | POA: Diagnosis not present

## 2017-10-07 DIAGNOSIS — I5023 Acute on chronic systolic (congestive) heart failure: Secondary | ICD-10-CM | POA: Diagnosis not present

## 2017-10-07 DIAGNOSIS — I110 Hypertensive heart disease with heart failure: Secondary | ICD-10-CM | POA: Diagnosis not present

## 2017-10-07 DIAGNOSIS — L97521 Non-pressure chronic ulcer of other part of left foot limited to breakdown of skin: Secondary | ICD-10-CM | POA: Diagnosis not present

## 2017-10-08 DIAGNOSIS — Z9181 History of falling: Secondary | ICD-10-CM | POA: Diagnosis not present

## 2017-10-08 DIAGNOSIS — E785 Hyperlipidemia, unspecified: Secondary | ICD-10-CM | POA: Diagnosis not present

## 2017-10-08 DIAGNOSIS — I110 Hypertensive heart disease with heart failure: Secondary | ICD-10-CM | POA: Diagnosis not present

## 2017-10-08 DIAGNOSIS — I5023 Acute on chronic systolic (congestive) heart failure: Secondary | ICD-10-CM | POA: Diagnosis not present

## 2017-10-08 DIAGNOSIS — Z86718 Personal history of other venous thrombosis and embolism: Secondary | ICD-10-CM | POA: Diagnosis not present

## 2017-10-08 DIAGNOSIS — L97521 Non-pressure chronic ulcer of other part of left foot limited to breakdown of skin: Secondary | ICD-10-CM | POA: Diagnosis not present

## 2017-10-08 DIAGNOSIS — E11621 Type 2 diabetes mellitus with foot ulcer: Secondary | ICD-10-CM | POA: Diagnosis not present

## 2017-10-08 DIAGNOSIS — I5031 Acute diastolic (congestive) heart failure: Secondary | ICD-10-CM | POA: Diagnosis not present

## 2017-10-08 DIAGNOSIS — Z7984 Long term (current) use of oral hypoglycemic drugs: Secondary | ICD-10-CM | POA: Diagnosis not present

## 2017-10-08 DIAGNOSIS — E039 Hypothyroidism, unspecified: Secondary | ICD-10-CM | POA: Diagnosis not present

## 2017-10-09 DIAGNOSIS — Z9181 History of falling: Secondary | ICD-10-CM | POA: Diagnosis not present

## 2017-10-09 DIAGNOSIS — Z7984 Long term (current) use of oral hypoglycemic drugs: Secondary | ICD-10-CM | POA: Diagnosis not present

## 2017-10-09 DIAGNOSIS — L97521 Non-pressure chronic ulcer of other part of left foot limited to breakdown of skin: Secondary | ICD-10-CM | POA: Diagnosis not present

## 2017-10-09 DIAGNOSIS — E785 Hyperlipidemia, unspecified: Secondary | ICD-10-CM | POA: Diagnosis not present

## 2017-10-09 DIAGNOSIS — I5023 Acute on chronic systolic (congestive) heart failure: Secondary | ICD-10-CM | POA: Diagnosis not present

## 2017-10-09 DIAGNOSIS — I110 Hypertensive heart disease with heart failure: Secondary | ICD-10-CM | POA: Diagnosis not present

## 2017-10-09 DIAGNOSIS — E11621 Type 2 diabetes mellitus with foot ulcer: Secondary | ICD-10-CM | POA: Diagnosis not present

## 2017-10-09 DIAGNOSIS — E039 Hypothyroidism, unspecified: Secondary | ICD-10-CM | POA: Diagnosis not present

## 2017-10-09 DIAGNOSIS — I5031 Acute diastolic (congestive) heart failure: Secondary | ICD-10-CM | POA: Diagnosis not present

## 2017-10-09 DIAGNOSIS — Z86718 Personal history of other venous thrombosis and embolism: Secondary | ICD-10-CM | POA: Diagnosis not present

## 2017-10-09 IMAGING — CR DG CHEST 2V
2 series · 2 of 2 positions shown · non-contrast
Comparison: CT scan of the chest March 19, 2017

CLINICAL DATA: Follow-up left lower lobe consolidation-atelectasis.

EXAM:
CHEST  2 VIEW

[chest pa]
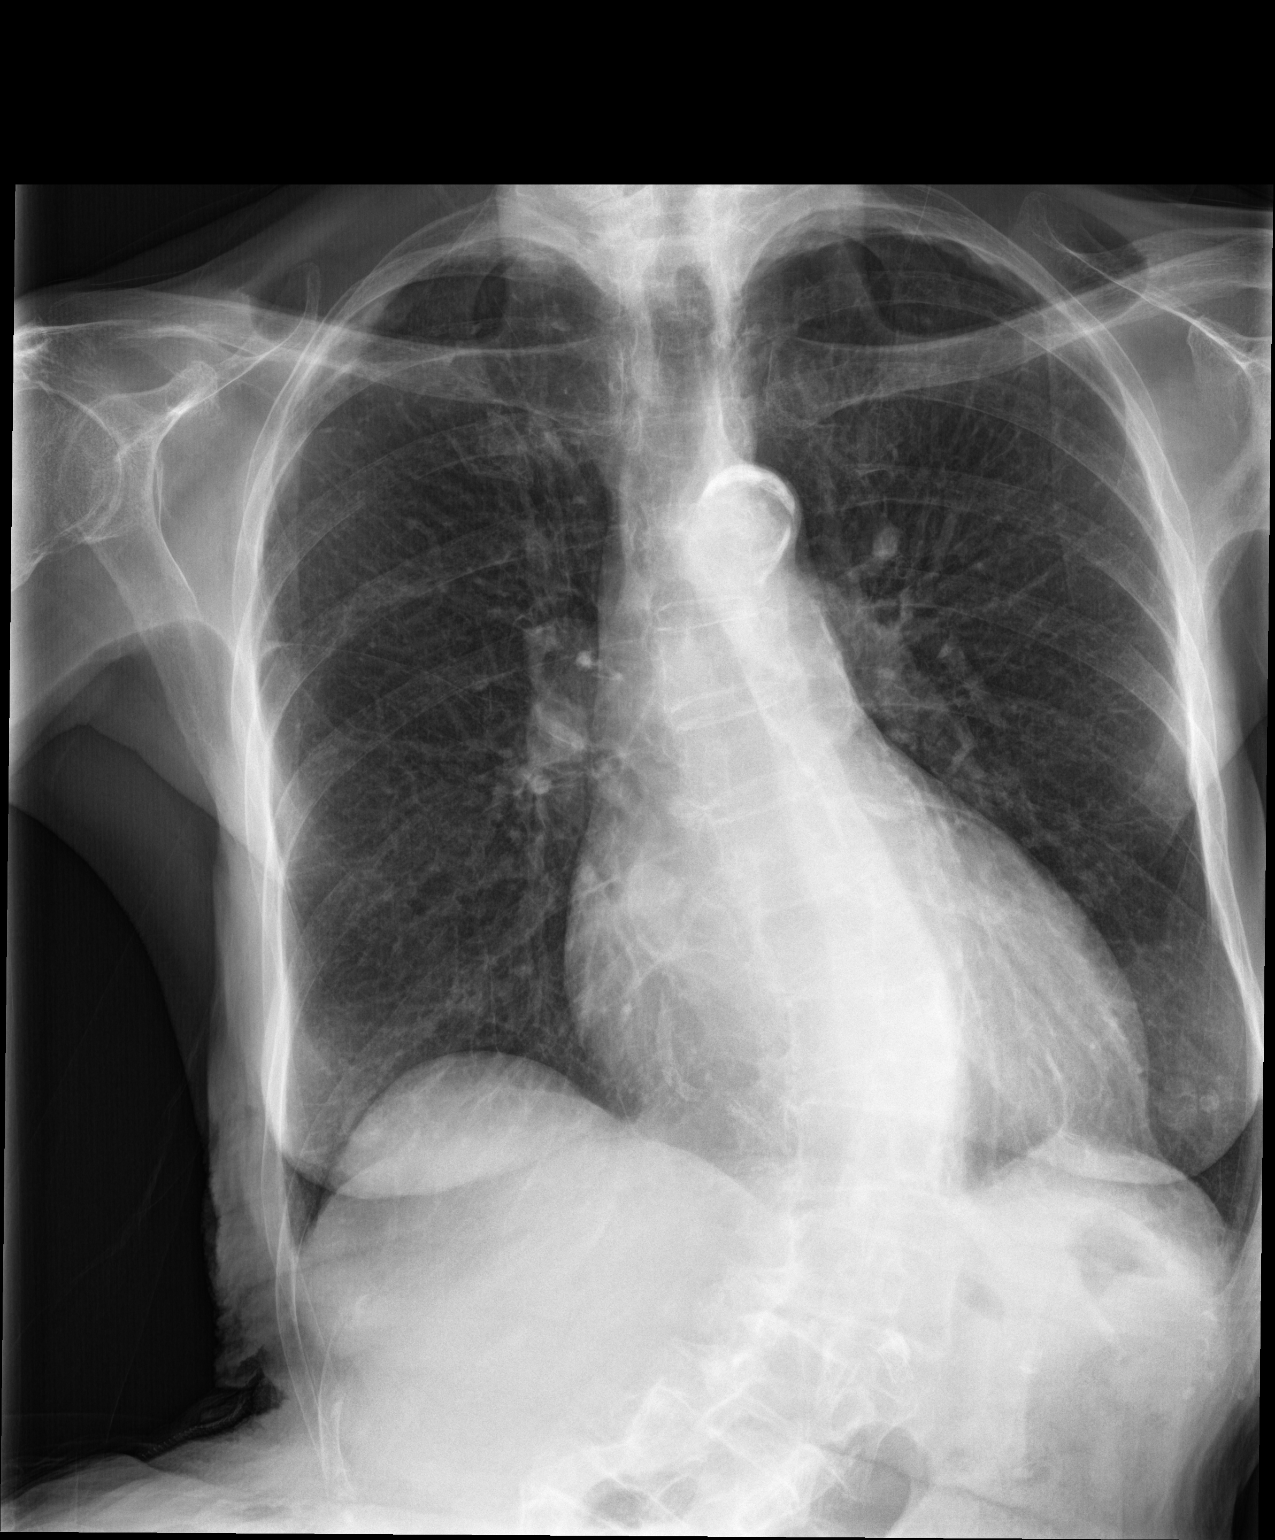

[chest lat]
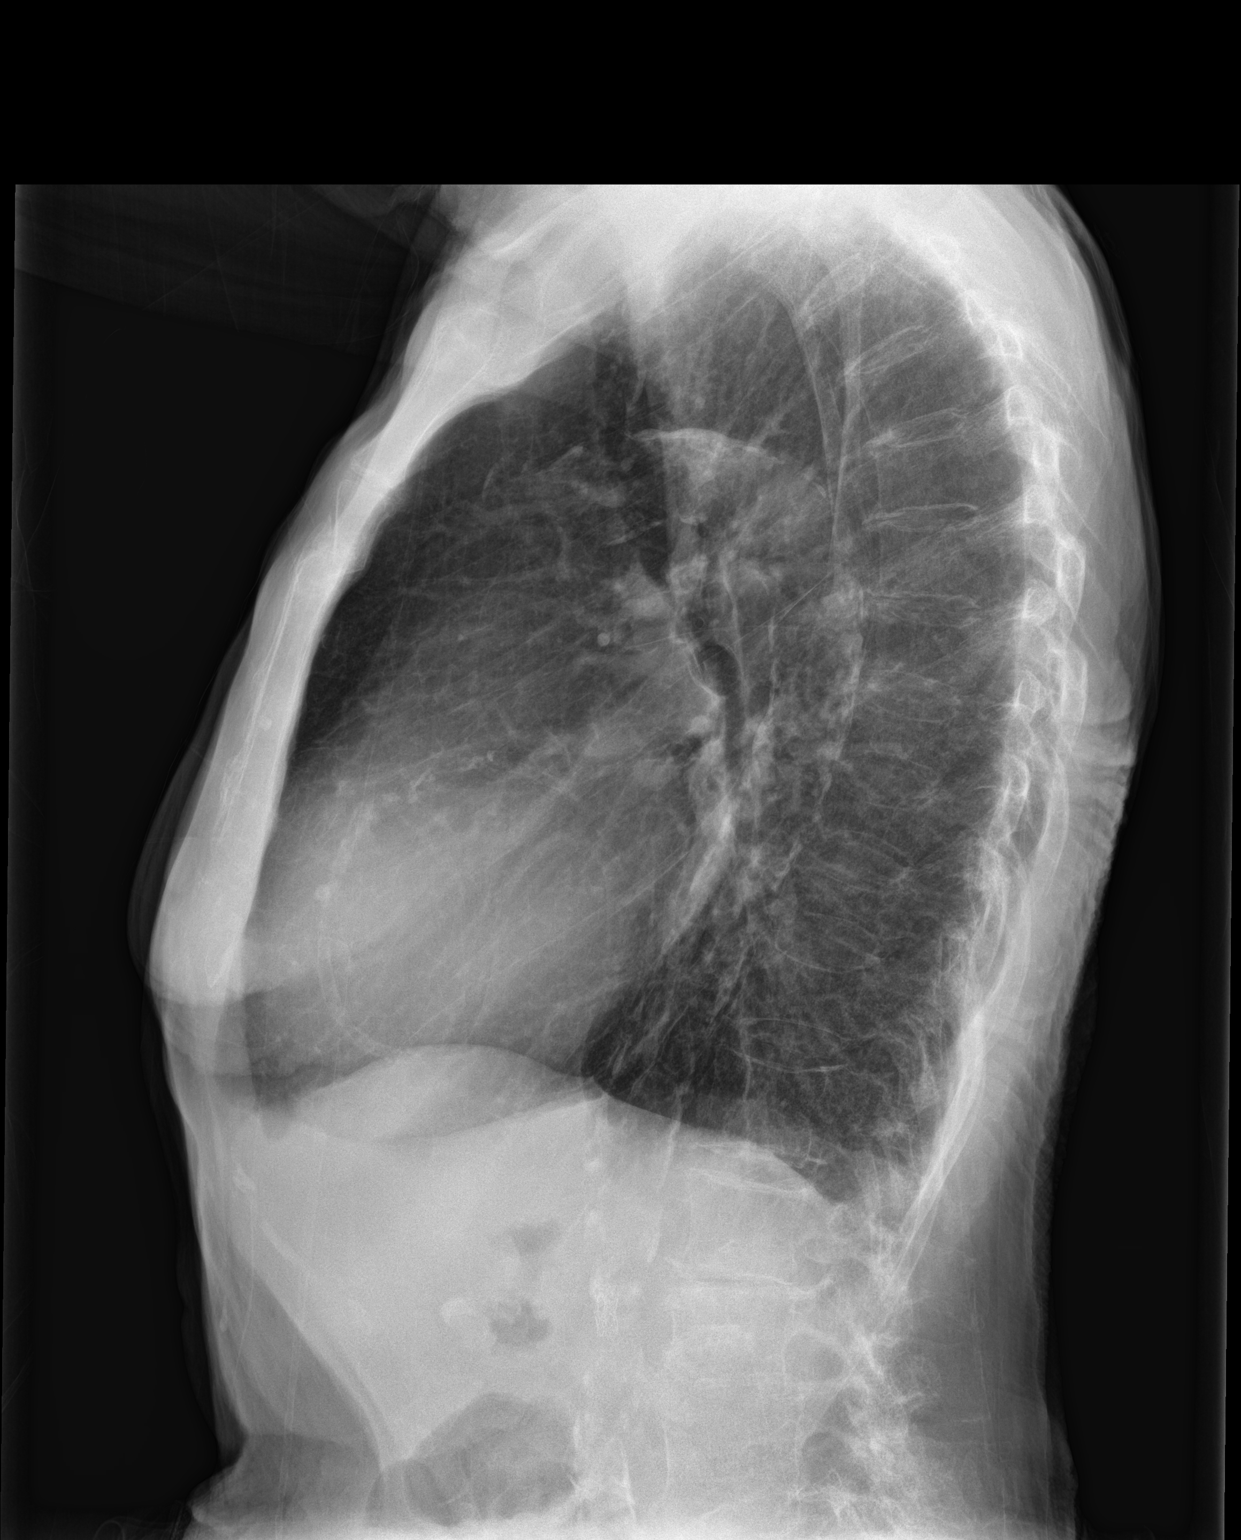

[2 of 2 positions shown; findings below may reference images not displayed]

FINDINGS: The lungs are well-expanded. The bilateral pleural effusions have
resolved. No infiltrate or atelectasis persists. The cardiac
silhouette is top-normal in size. The pulmonary vascularity is
normal. Coronary artery calcifications are present. There is dense
calcification in the wall of the thoracic aorta. There is prominent
levocurvature centered in the lower thoracic spine.
IMPRESSION: Interval clearing of left lower lobe atelectasis or pneumonia as
well as bilateral pleural effusions. Stable top-normal cardiac size
without pulmonary edema.

Coronary artery and thoracic aortic atherosclerosis.

## 2017-10-10 DIAGNOSIS — Z9181 History of falling: Secondary | ICD-10-CM | POA: Diagnosis not present

## 2017-10-10 DIAGNOSIS — I5023 Acute on chronic systolic (congestive) heart failure: Secondary | ICD-10-CM | POA: Diagnosis not present

## 2017-10-10 DIAGNOSIS — E11621 Type 2 diabetes mellitus with foot ulcer: Secondary | ICD-10-CM | POA: Diagnosis not present

## 2017-10-10 DIAGNOSIS — L97521 Non-pressure chronic ulcer of other part of left foot limited to breakdown of skin: Secondary | ICD-10-CM | POA: Diagnosis not present

## 2017-10-10 DIAGNOSIS — E039 Hypothyroidism, unspecified: Secondary | ICD-10-CM | POA: Diagnosis not present

## 2017-10-10 DIAGNOSIS — Z7984 Long term (current) use of oral hypoglycemic drugs: Secondary | ICD-10-CM | POA: Diagnosis not present

## 2017-10-10 DIAGNOSIS — I5031 Acute diastolic (congestive) heart failure: Secondary | ICD-10-CM | POA: Diagnosis not present

## 2017-10-10 DIAGNOSIS — I110 Hypertensive heart disease with heart failure: Secondary | ICD-10-CM | POA: Diagnosis not present

## 2017-10-10 DIAGNOSIS — E785 Hyperlipidemia, unspecified: Secondary | ICD-10-CM | POA: Diagnosis not present

## 2017-10-10 DIAGNOSIS — Z86718 Personal history of other venous thrombosis and embolism: Secondary | ICD-10-CM | POA: Diagnosis not present

## 2017-10-14 ENCOUNTER — Telehealth: Payer: Self-pay | Admitting: Family Medicine

## 2017-10-14 DIAGNOSIS — I5031 Acute diastolic (congestive) heart failure: Secondary | ICD-10-CM | POA: Diagnosis not present

## 2017-10-14 DIAGNOSIS — E785 Hyperlipidemia, unspecified: Secondary | ICD-10-CM | POA: Diagnosis not present

## 2017-10-14 DIAGNOSIS — I110 Hypertensive heart disease with heart failure: Secondary | ICD-10-CM | POA: Diagnosis not present

## 2017-10-14 DIAGNOSIS — Z9181 History of falling: Secondary | ICD-10-CM | POA: Diagnosis not present

## 2017-10-14 DIAGNOSIS — E039 Hypothyroidism, unspecified: Secondary | ICD-10-CM | POA: Diagnosis not present

## 2017-10-14 DIAGNOSIS — Z7984 Long term (current) use of oral hypoglycemic drugs: Secondary | ICD-10-CM | POA: Diagnosis not present

## 2017-10-14 DIAGNOSIS — I5023 Acute on chronic systolic (congestive) heart failure: Secondary | ICD-10-CM | POA: Diagnosis not present

## 2017-10-14 DIAGNOSIS — Z86718 Personal history of other venous thrombosis and embolism: Secondary | ICD-10-CM | POA: Diagnosis not present

## 2017-10-14 DIAGNOSIS — E11621 Type 2 diabetes mellitus with foot ulcer: Secondary | ICD-10-CM | POA: Diagnosis not present

## 2017-10-14 DIAGNOSIS — L97521 Non-pressure chronic ulcer of other part of left foot limited to breakdown of skin: Secondary | ICD-10-CM | POA: Diagnosis not present

## 2017-10-18 NOTE — Telephone Encounter (Signed)
Called by on call nurse. They were contacted by Lorraine Fischer who informed them that Lorraine Fischer passed away at home this evening of apparently natural causes. She is to be taken to the funeral home and the death certificate should be coming to Dr. Ronnald Ramp tomorrow. FYI

## 2017-10-18 DEATH — deceased

## 2018-05-09 ENCOUNTER — Encounter: Payer: Self-pay | Admitting: Family Medicine
